# Patient Record
Sex: Female | Born: 1945
Health system: Southern US, Community
[De-identification: ages and names within clinical notes are randomized; demographics above are authoritative.]

## PROBLEM LIST (undated history)

## (undated) DIAGNOSIS — F419 Anxiety disorder, unspecified: Secondary | ICD-10-CM

## (undated) DIAGNOSIS — E119 Type 2 diabetes mellitus without complications: Secondary | ICD-10-CM

## (undated) DIAGNOSIS — E785 Hyperlipidemia, unspecified: Secondary | ICD-10-CM

## (undated) DIAGNOSIS — E079 Disorder of thyroid, unspecified: Secondary | ICD-10-CM

## (undated) DIAGNOSIS — E039 Hypothyroidism, unspecified: Secondary | ICD-10-CM

## (undated) HISTORY — DX: Disorder of thyroid, unspecified: E07.9

## (undated) HISTORY — DX: Anxiety disorder, unspecified: F41.9

## (undated) HISTORY — DX: Hyperlipidemia, unspecified: E78.5

---

## 1971-11-17 HISTORY — PX: OTHER SURGICAL HISTORY: SHX169

## 1990-11-16 HISTORY — PX: ABDOMINAL HYSTERECTOMY: SHX81

## 1994-11-16 HISTORY — PX: CHOLECYSTECTOMY: SHX55

## 2004-11-12 ENCOUNTER — Ambulatory Visit: Payer: Self-pay | Admitting: Unknown Physician Specialty

## 2006-05-13 ENCOUNTER — Ambulatory Visit: Payer: Self-pay | Admitting: Otolaryngology

## 2007-07-04 DIAGNOSIS — N3941 Urge incontinence: Secondary | ICD-10-CM | POA: Insufficient documentation

## 2007-07-04 DIAGNOSIS — E049 Nontoxic goiter, unspecified: Secondary | ICD-10-CM | POA: Insufficient documentation

## 2007-07-04 DIAGNOSIS — E010 Iodine-deficiency related diffuse (endemic) goiter: Secondary | ICD-10-CM | POA: Insufficient documentation

## 2007-07-04 HISTORY — DX: Nontoxic goiter, unspecified: E04.9

## 2007-09-20 ENCOUNTER — Ambulatory Visit: Payer: Self-pay | Admitting: Unknown Physician Specialty

## 2009-01-24 ENCOUNTER — Ambulatory Visit: Payer: Self-pay | Admitting: Otolaryngology

## 2009-07-29 ENCOUNTER — Ambulatory Visit: Payer: Self-pay | Admitting: Otolaryngology

## 2010-02-24 ENCOUNTER — Ambulatory Visit: Payer: Self-pay | Admitting: Otolaryngology

## 2010-03-17 ENCOUNTER — Ambulatory Visit: Payer: Self-pay | Admitting: Otolaryngology

## 2010-12-30 IMAGING — US US THYROID
1 series · 17 of 25 positions shown · non-contrast
Comparison: none

REASON FOR EXAM: thyroid nodule  req [facility]
COMMENTS:

[Series 1: us thyroid · 17 of 40 slices shown]
[im 1/40]
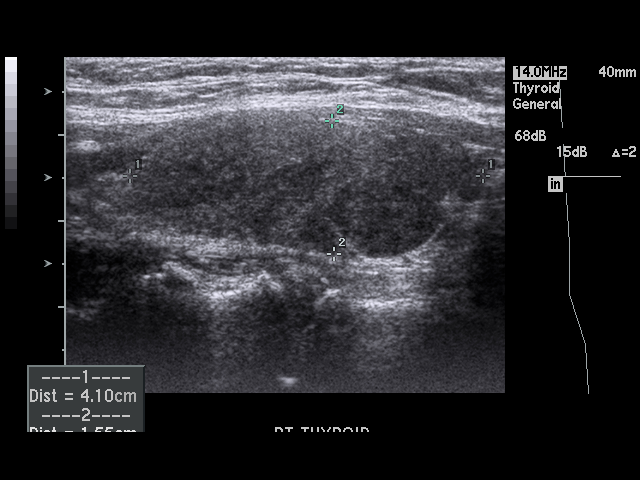
[im 4/40]
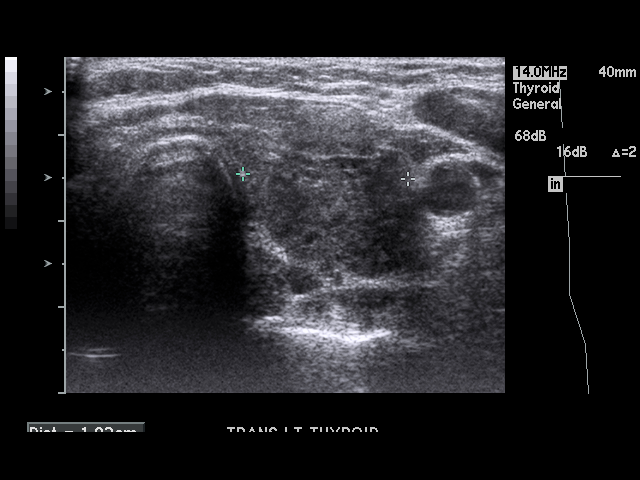
[im 5/40]
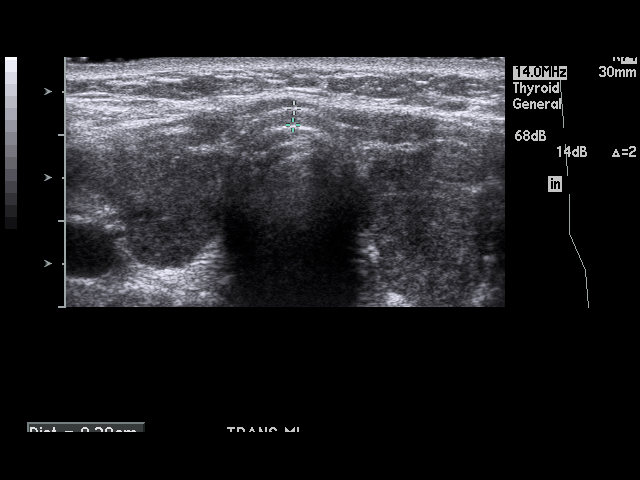
[im 9/40]
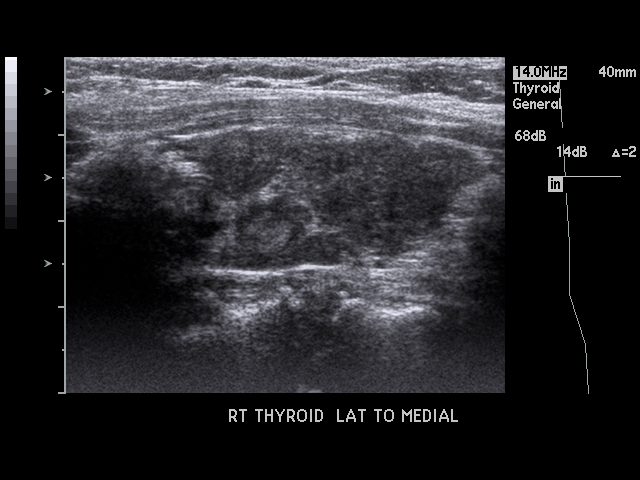
[im 10/40]
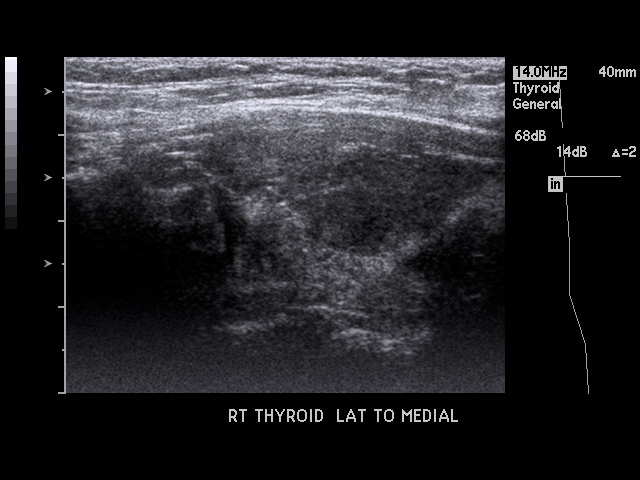
[im 14/40]
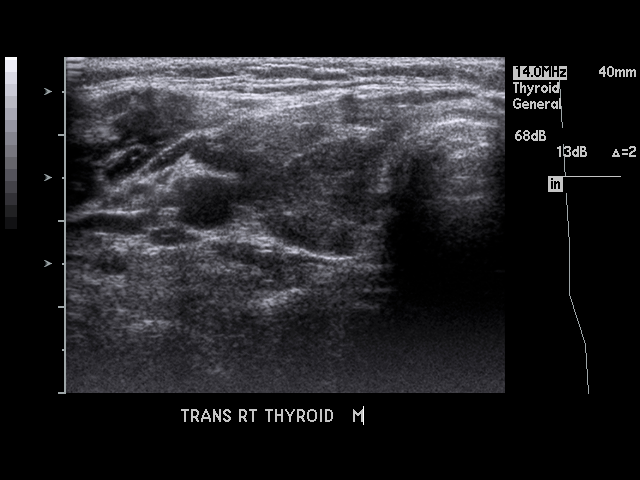
[im 15/40]
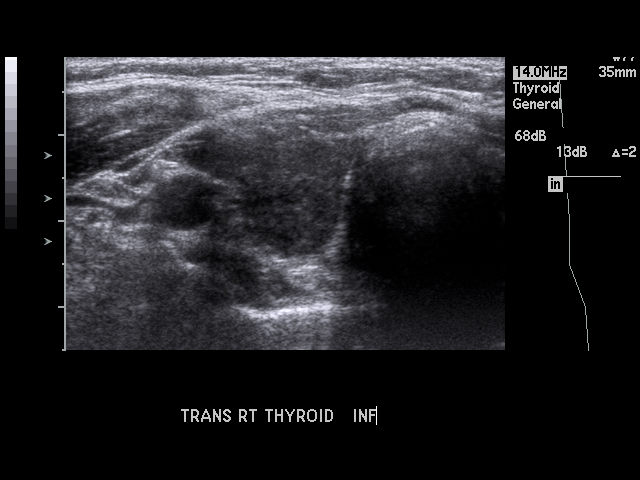
[im 18/40]
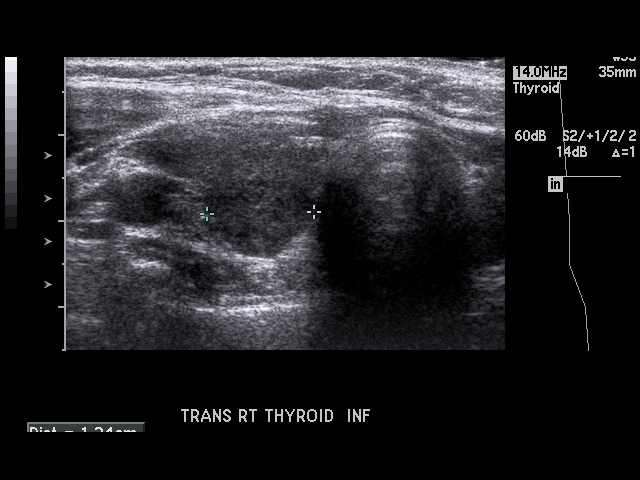
[im 20/40]
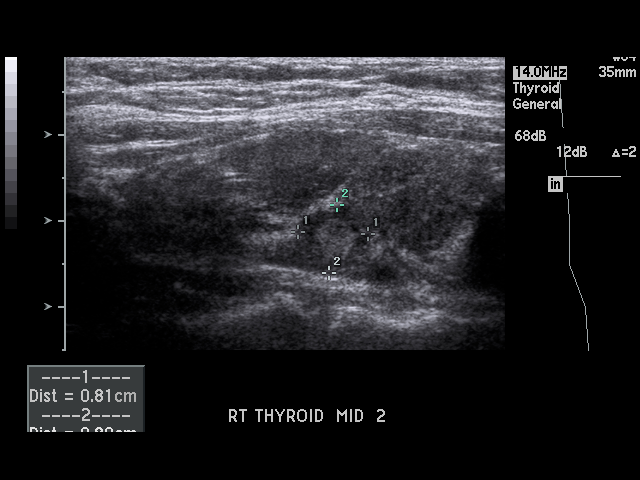
[im 22/40]
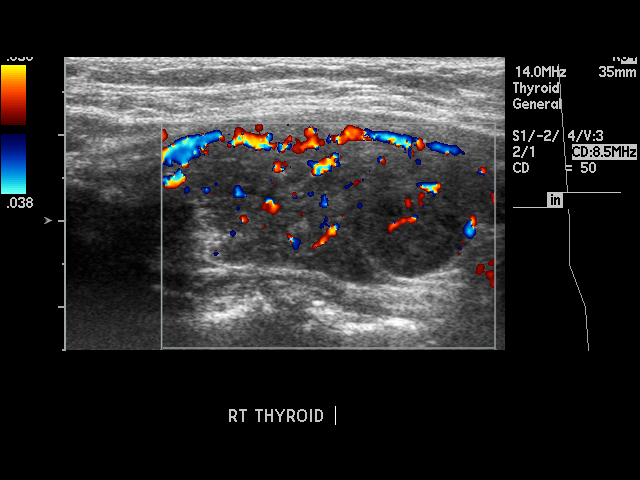
[im 25/40]
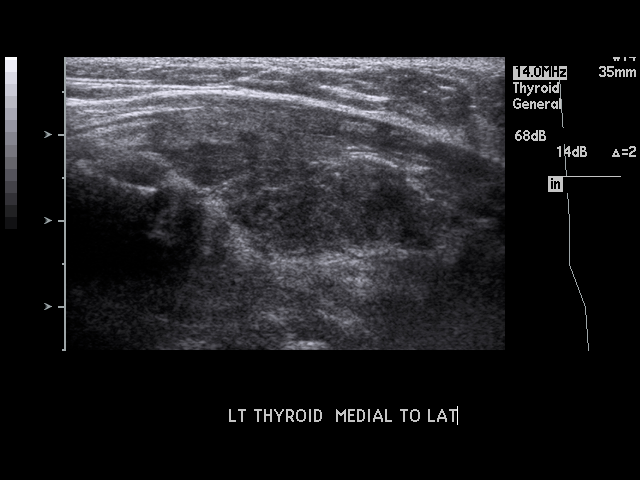
[im 27/40]
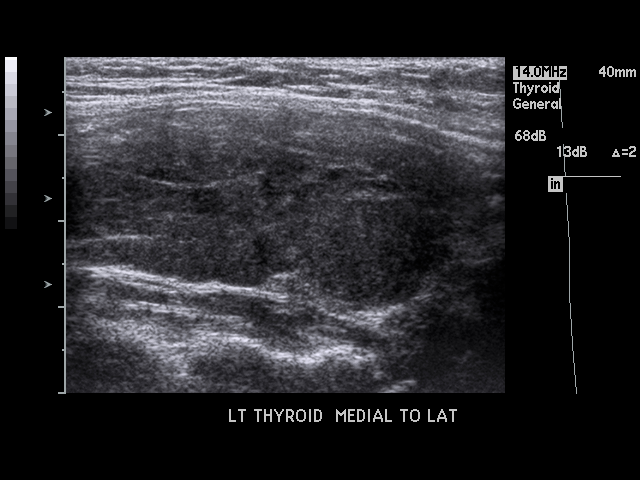
[im 30/40]
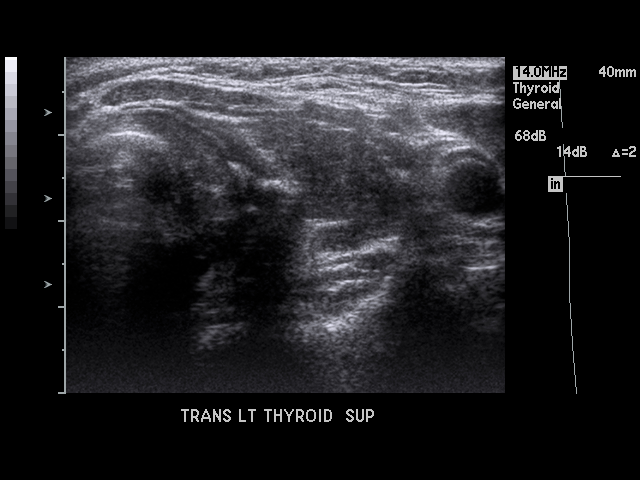
[im 31/40]
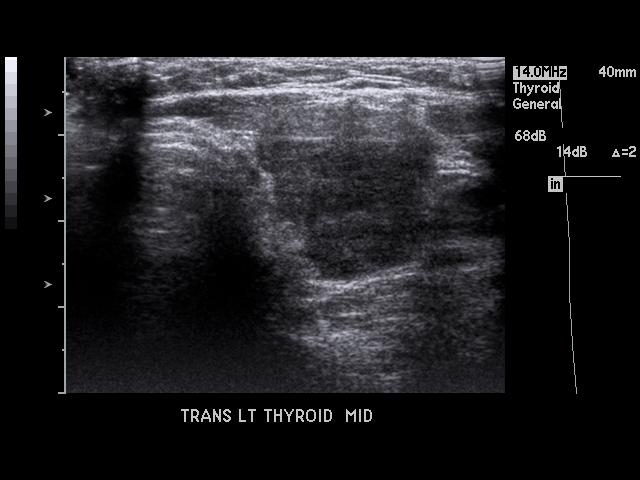
[im 35/40]
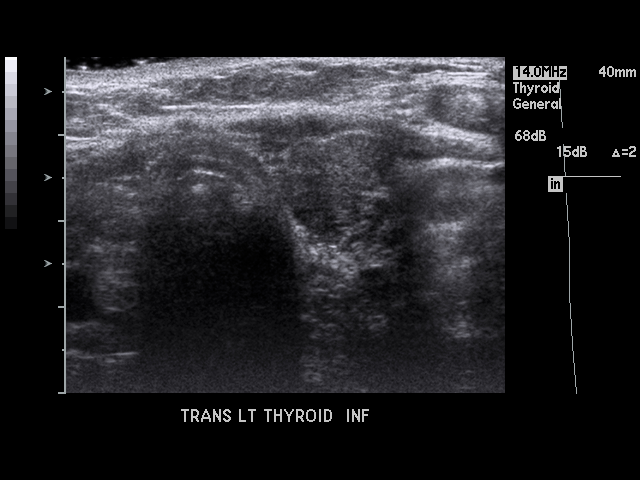
[im 36/40]
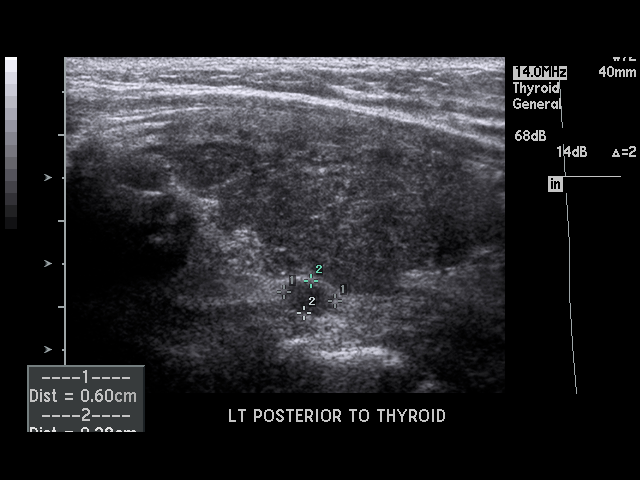
[im 40/40]
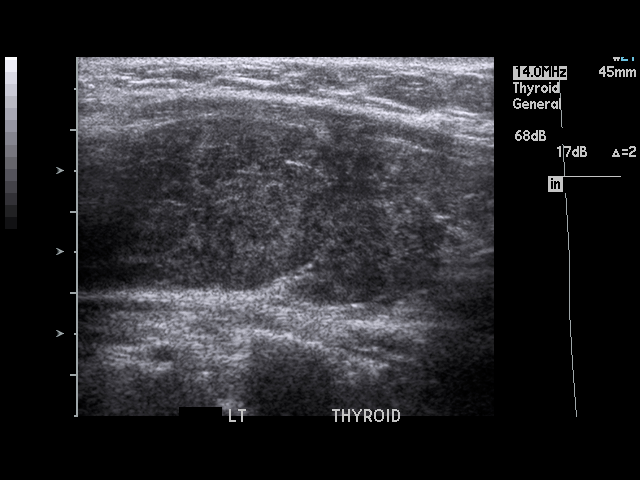

[17 of 25 positions shown; findings below may reference images not displayed]

PROCEDURE:     US  - US THYROID  - January 24, 2009 [DATE]

RESULT:     The right thyroid lobe measures 4.1 x 2.2 x 1.6 cm. The left
thyroid lobe measures 4.7 x 1.9 x 2.1 cm. The echotexture of the thyroid
tissue is heterogeneous. On the right in the midpole there is a
centimeter diameter hypoechoic nodule. In the lower pole there is a 1.3 by
1.2 x 1 cm diameter hypoechoic nodule. On the left no definite nodule is
demonstrated, but posterior to the thyroid there may be a nodule such as a
lymph node measuring approximately 0.6 centimeters in diameter. This is a
nonspecific finding however.
IMPRESSION: 1. The right thyroid lobe contains 2 hypoechoic nodules. I see no abnormal
nodules on the left.
2. Posterior to the left lobe there is an approximately 0.6 cm diameter
nodule. Further evaluation with CT scanning of the neck could be considered
if felt clinically indicated. Alternatively continued followup thyroid
ultrasound could be performed.

## 2012-01-30 IMAGING — US US THYROID
1 series · 17 of 25 positions shown · non-contrast
Comparison: none

REASON FOR EXAM: thyroid nodule hypothryoidism
COMMENTS:

[Series 1: us thyroid · 17 of 56 slices shown]
[im 1/56]
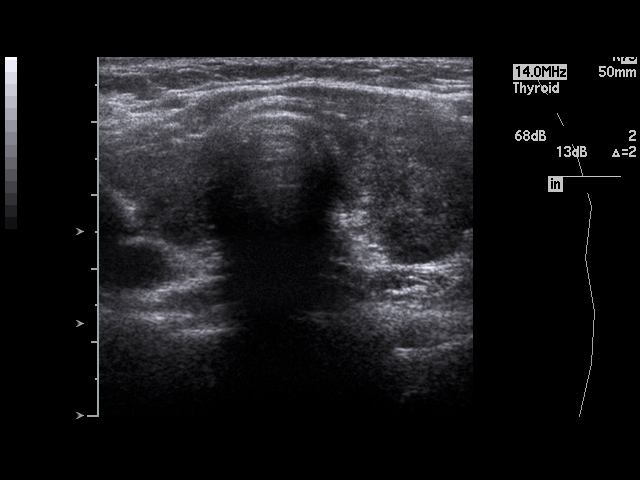
[im 5/56]
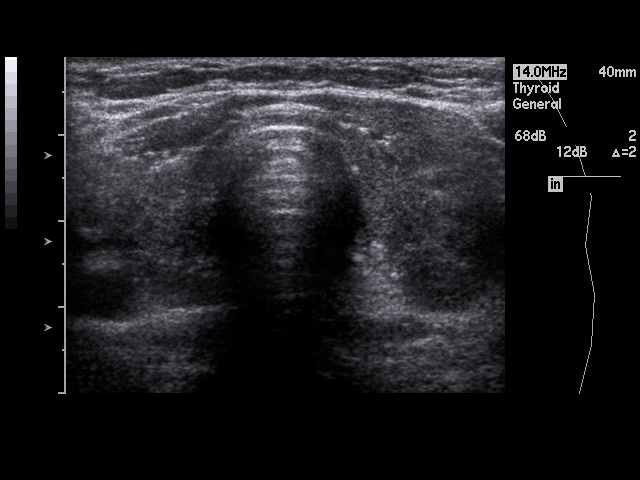
[im 7/56]
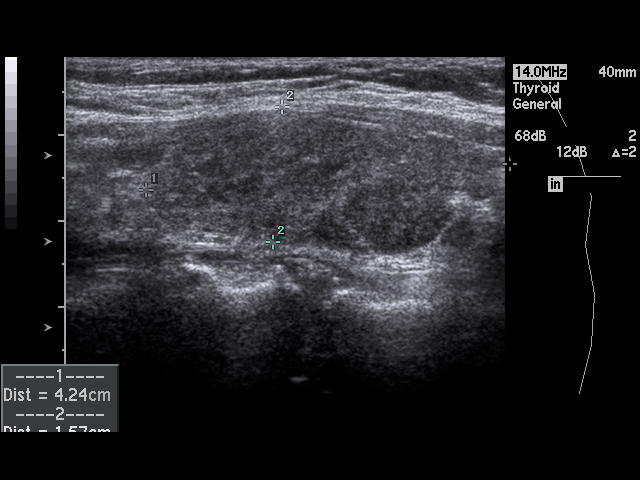
[im 12/56]
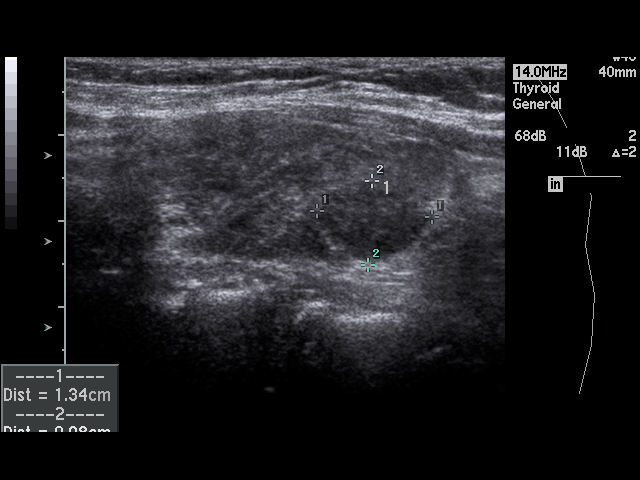
[im 14/56]
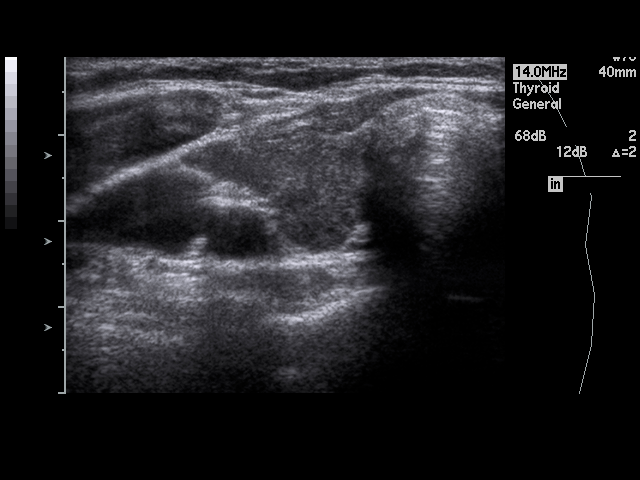
[im 19/56]
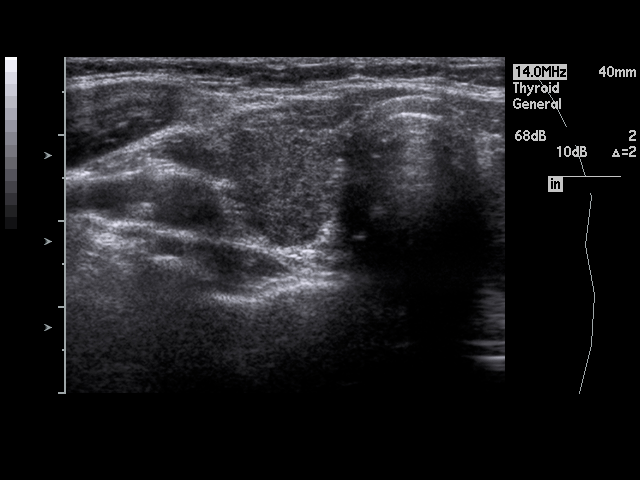
[im 21/56]
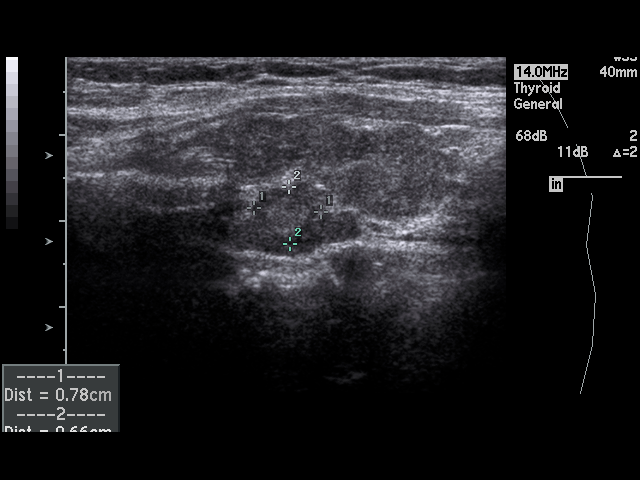
[im 26/56]
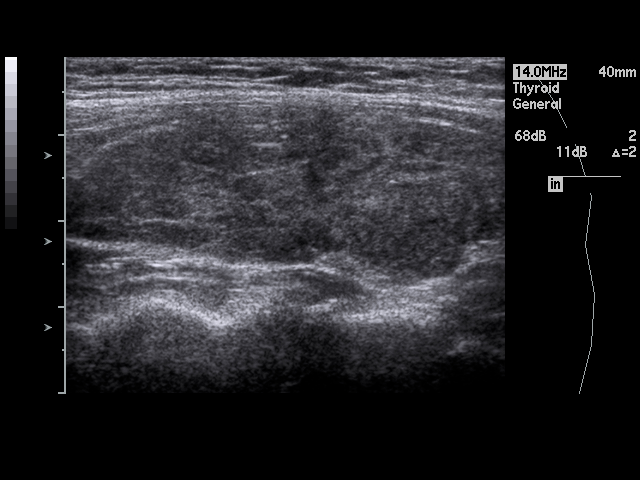
[im 28/56]
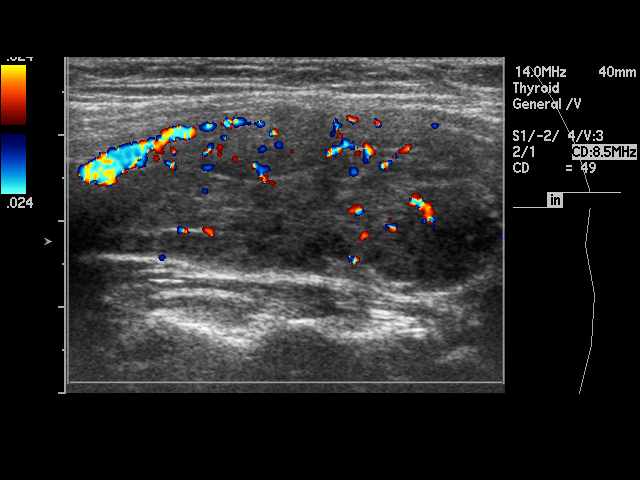
[im 30/56]
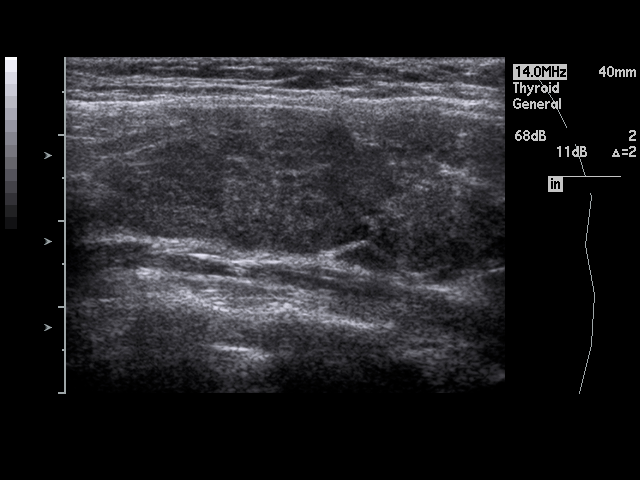
[im 35/56]
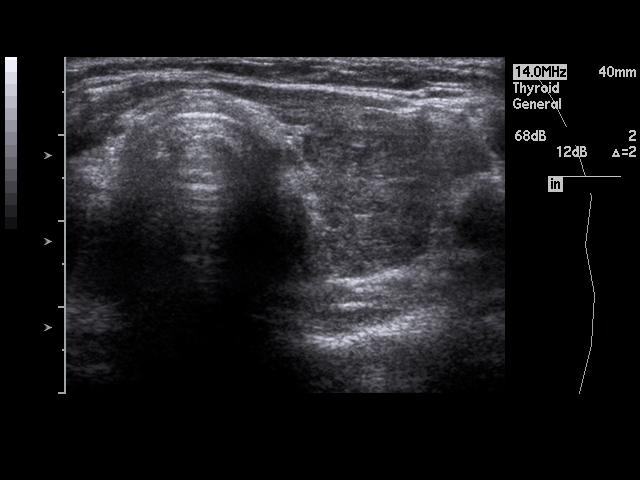
[im 37/56]
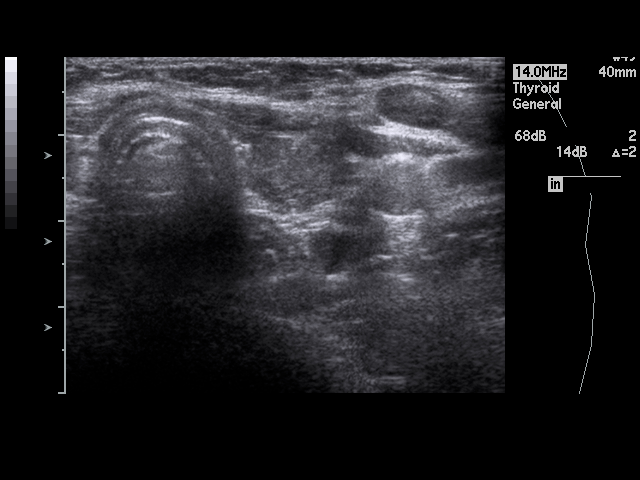
[im 42/56]
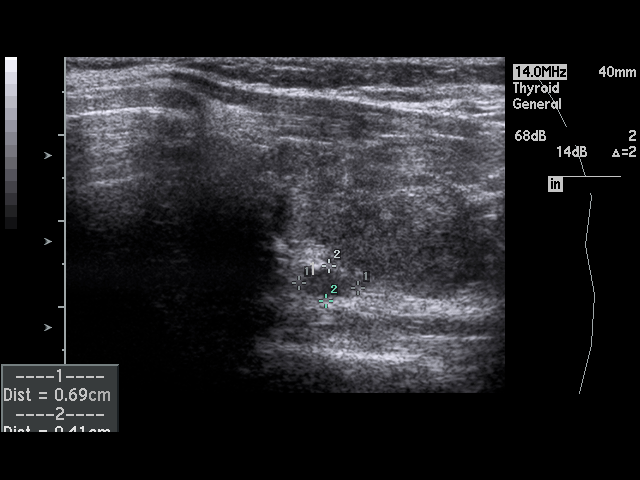
[im 44/56]
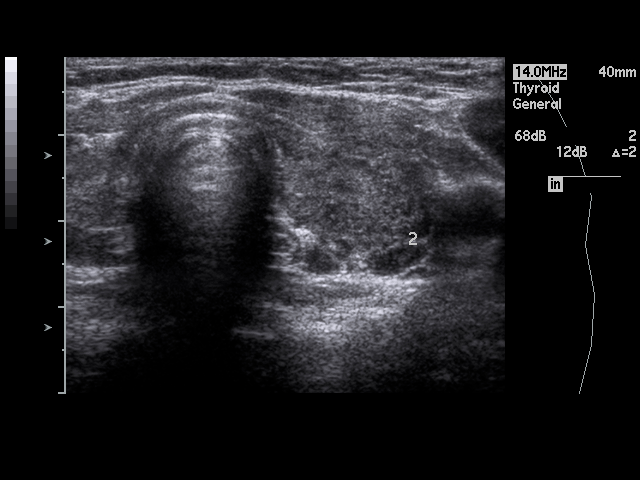
[im 49/56]
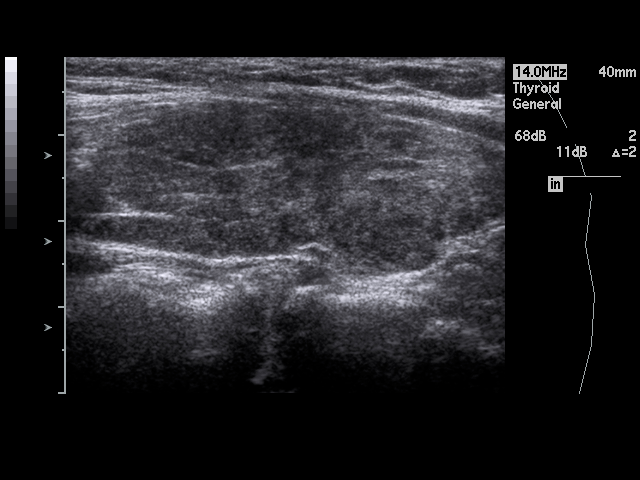
[im 51/56]
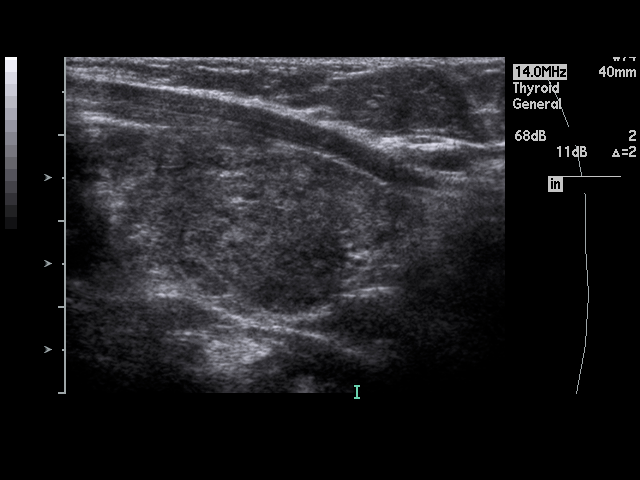
[im 56/56]
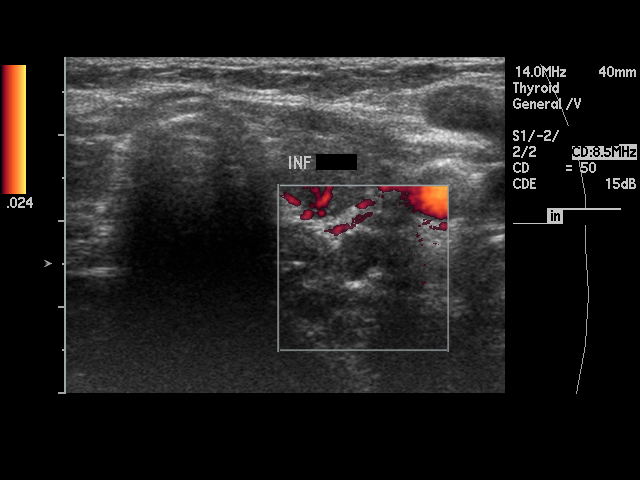

[17 of 25 positions shown; findings below may reference images not displayed]

PROCEDURE:     US  - US THYROID  - February 24, 2010  [DATE]

RESULT:

The right lobe of the thyroid measures 4.24 x 1.49 x 1.57 cm. The isthmus is
2.2 mm in thickness. The left lobe measures 5.4 x 1.7 x 1.93 cm. Evaluation
of the right lobe of the thyroid demonstrates a solid appearing nodule in
the inferior portion of the thyroid measuring 1.34 x 0.92 x 0.98 cm. A
second smaller nodule is identified within the midportion measuring 0.78 x
0.84 centimeters. The right lobe of the thyroid otherwise demonstrates a
heterogeneous echotexture. Evaluation of the left lobe of the thyroid
demonstrates a heterogeneous echotexture without evidence of focal solid or
cystic nodules. Small, solid-appearing nodules project posterior to the
thyroid. These areas measure 0.69 x 0.43 x 0.41 cm, 0.72 x 0.75 x 0.32 cm,
and 0.10 x 0.62 x 0.53.

When correlated with a previous ultrasound of the thyroid dated 07/29/2009
the nodules within the right lobe of the thyroid are stable. The small
nodules posterior to the left lobe of the thyroid have increased in numbers.
Previously, a single nodule was described.
IMPRESSION: 1. Stable nodules within the right lobe of the thyroid.
2. Increased numbers of the small nodules posterior to the left lobe. These
may represent lymph nodes though if there is concern of parathyroid
abnormality, parathyroid nodules cannot be excluded and if clinically
warranted can be further evaluated with parathyroid nuclear medicine
evaluation. Correlation with parathyroid function test is recommended.

## 2012-02-03 LAB — HM COLONOSCOPY

## 2012-10-24 DIAGNOSIS — L989 Disorder of the skin and subcutaneous tissue, unspecified: Secondary | ICD-10-CM | POA: Insufficient documentation

## 2012-11-16 HISTORY — PX: EYE SURGERY: SHX253

## 2013-09-20 LAB — HM PAP SMEAR: HM PAP: NEGATIVE

## 2013-09-26 LAB — HM DEXA SCAN: HM Dexa Scan: NORMAL

## 2014-01-02 LAB — HM MAMMOGRAPHY

## 2014-04-17 ENCOUNTER — Ambulatory Visit: Payer: Self-pay | Admitting: Internal Medicine

## 2015-01-10 LAB — BASIC METABOLIC PANEL
BUN: 14 mg/dL (ref 4–21)
CREATININE: 0.6 mg/dL (ref 0.5–1.1)
Glucose: 106 mg/dL
Potassium: 4.6 mmol/L (ref 3.4–5.3)
SODIUM: 142 mmol/L (ref 137–147)

## 2015-01-10 LAB — LIPID PANEL
CHOLESTEROL: 179 mg/dL (ref 0–200)
HDL: 56 mg/dL (ref 35–70)
LDL Cholesterol: 91 mg/dL
TRIGLYCERIDES: 158 mg/dL (ref 40–160)

## 2015-01-10 LAB — HEPATIC FUNCTION PANEL
ALT: 25 U/L (ref 7–35)
AST: 20 U/L (ref 13–35)

## 2015-01-10 LAB — HEMOGLOBIN A1C: Hgb A1c MFr Bld: 5.3 % (ref 4.0–6.0)

## 2015-07-15 ENCOUNTER — Encounter: Payer: Self-pay | Admitting: Family Medicine

## 2015-07-15 ENCOUNTER — Ambulatory Visit (INDEPENDENT_AMBULATORY_CARE_PROVIDER_SITE_OTHER): Payer: Medicare Other | Admitting: Family Medicine

## 2015-07-15 VITALS — BP 122/74 | HR 68 | Temp 97.8°F | Resp 16 | Wt 183.0 lb

## 2015-07-15 DIAGNOSIS — F419 Anxiety disorder, unspecified: Secondary | ICD-10-CM | POA: Insufficient documentation

## 2015-07-15 DIAGNOSIS — N951 Menopausal and female climacteric states: Secondary | ICD-10-CM | POA: Insufficient documentation

## 2015-07-15 DIAGNOSIS — S161XXA Strain of muscle, fascia and tendon at neck level, initial encounter: Secondary | ICD-10-CM | POA: Insufficient documentation

## 2015-07-15 DIAGNOSIS — E781 Pure hyperglyceridemia: Secondary | ICD-10-CM | POA: Insufficient documentation

## 2015-07-15 DIAGNOSIS — K648 Other hemorrhoids: Secondary | ICD-10-CM | POA: Insufficient documentation

## 2015-07-15 DIAGNOSIS — L409 Psoriasis, unspecified: Secondary | ICD-10-CM | POA: Insufficient documentation

## 2015-07-15 DIAGNOSIS — K649 Unspecified hemorrhoids: Secondary | ICD-10-CM

## 2015-07-15 DIAGNOSIS — L290 Pruritus ani: Secondary | ICD-10-CM | POA: Diagnosis not present

## 2015-07-15 DIAGNOSIS — R234 Changes in skin texture: Secondary | ICD-10-CM

## 2015-07-15 DIAGNOSIS — E039 Hypothyroidism, unspecified: Secondary | ICD-10-CM | POA: Insufficient documentation

## 2015-07-15 DIAGNOSIS — R239 Unspecified skin changes: Secondary | ICD-10-CM

## 2015-07-15 DIAGNOSIS — Z1211 Encounter for screening for malignant neoplasm of colon: Secondary | ICD-10-CM | POA: Diagnosis not present

## 2015-07-15 DIAGNOSIS — E559 Vitamin D deficiency, unspecified: Secondary | ICD-10-CM | POA: Insufficient documentation

## 2015-07-15 DIAGNOSIS — R739 Hyperglycemia, unspecified: Secondary | ICD-10-CM | POA: Insufficient documentation

## 2015-07-15 DIAGNOSIS — S8000XA Contusion of unspecified knee, initial encounter: Secondary | ICD-10-CM | POA: Insufficient documentation

## 2015-07-15 MED ORDER — NYSTATIN 100000 UNIT/GM EX OINT: 1.0000 "application " | TOPICAL_OINTMENT | Freq: Two times a day (BID) | CUTANEOUS | Status: DC

## 2015-07-15 NOTE — Progress Notes (Signed)
Subjective:    Patient ID: Jodi Day, female    DOB: January 22, 1946, 69 y.o.   MRN: [id number]  Urinary Tract Infection  The current episode started in the past 7 days. The problem has been gradually improving. The quality of the pain is described as burning. There has been no fever. There is a history of pyelonephritis (Had that about 30 years ago.). Associated symptoms include urgency. Pertinent negatives include no chills, flank pain, frequency, hematuria, nausea or vomiting. Her past medical history is significant for recurrent UTIs.    Hemorrhoids: Patient complains of evaluation of possible hemorrhoids. Onset of symptoms was gradual starting 2 weeks ago ago with gradually improving course since that time.  She describes symptoms as anorectal itching, bleeding which only occurs with bowel movements and rectal blood .Marland Kitchen Treatment to date has been Anusol. Patient denies family hx of colorectal CA.   Patient Active Problem List   Diagnosis Date Noted  . Anxiety 07/15/2015  . Cervical muscle strain 07/15/2015  . Dermatitis, eczematoid 07/15/2015  . Blood glucose elevated 07/15/2015  . Hypertriglyceridemia 07/15/2015  . Adult hypothyroidism 07/15/2015  . Hemorrhoids, internal 07/15/2015  . Contusion of knee 07/15/2015  . Symptomatic menopausal or female climacteric states 07/15/2015  . Avitaminosis D 07/15/2015  . Disease of skin and subcutaneous tissue 10/24/2012  . Big thyroid 07/04/2007  . Urge incontinence 07/04/2007   Family History  Problem Relation Age of Onset  . Diabetes Mother   . Hypothyroidism Mother   . Stroke Mother   . COPD Father   . Prostate cancer Brother   . Colon polyps Brother   . Prostate cancer Brother    Social History   Social History  . Marital Status: Married    Spouse Name: N/A  . Number of Children: 2  . Years of Education: N/A   Occupational History  . 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 Smoking  status: Former Games developer  . Smokeless tobacco: Never Used  . Alcohol Use: Yes     Comment: Wine Rarely  . Drug Use: No  . Sexual Activity: Not on file   Other Topics Concern  . Not on file   Social History Narrative   Past Surgical History  Procedure Laterality Date  . Cholecystectomy  1996  . Abdominal hysterectomy  1992    fibroids. ovaries intact  . Tonsillectomy and adenoidectomy  1973  . Thyroidectomy  1973   Allergies  Allergen Reactions  . Penicillins     per pt's mother.   Previous Medications   ASCORBIC ACID (VITAMIN C) 1000 MG TABLET    Take 1 tablet by mouth daily.   CALCIUM CARBONATE-VIT D-MIN (CALCIUM 1200) 1200-1000 MG-UNIT CHEW    Chew 1 tablet by mouth daily.   COENZYME Q10 (COQ10) 400 MG CAPS    Take 1 capsule by mouth daily.   ESTRADIOL-ESTRIOL-PROGESTERONE (BIEST/PROGESTERONE) CREA    Place 1 Squirt onto the skin daily.   LEVOTHYROXINE (SYNTHROID, LEVOTHROID) 150 MCG TABLET    Take 150 mcg by mouth daily before breakfast.   LIOTHYRONINE (CYTOMEL) 5 MCG TABLET    Take 1 tablet by mouth daily.   METFORMIN (GLUCOPHAGE) 500 MG TABLET    Take 1 tablet by mouth 2 (two) times daily.   OMEGA-3 FATTY ACIDS PO    Take 1 capsule by mouth daily.   PROGESTERONE (PROMETRIUM) 200 MG CAPSULE    Take 1 capsule by mouth daily.  VITAMIN D, CHOLECALCIFEROL, 1000 UNITS CAPS    Take 1 capsule by mouth daily.   VITAMIN E 400 UNIT CAPSULE    Take 1 capsule by mouth daily.       Review of Systems  Constitutional: Positive for fatigue. Negative for fever, chills, diaphoresis, activity change, appetite change and unexpected weight change.  Respiratory: Negative for apnea, cough, choking, chest tightness, shortness of breath, wheezing and stridor.   Cardiovascular: Negative for chest pain, palpitations and leg swelling.  Gastrointestinal: Positive for anal bleeding and rectal pain. Negative for nausea, vomiting, abdominal pain, diarrhea, constipation, blood in stool and abdominal  distention.  Genitourinary: Positive for dysuria, urgency, decreased urine volume and difficulty urinating. Negative for frequency, hematuria, flank pain, vaginal bleeding, vaginal discharge, enuresis, genital sores, vaginal pain, menstrual problem, pelvic pain and dyspareunia.       Objective:   Physical Exam  Constitutional: She is oriented to person, place, and time. She appears well-developed and well-nourished.  Genitourinary: Rectal exam shows external hemorrhoid (Some erythema. Some excoriation from scratching.  No thrombosis noted. ).  Neurological: She is alert and oriented to person, place, and time.   BP 122/74 mmHg  Pulse 68  Temp(Src) 97.8 F (36.6 C) (Oral)  Resp 16  Wt 183 lb (83.008 kg)      Assessment & Plan:  1. Hemorrhoids, unspecified hemorrhoid type Had clearly been very itchy, will treat for fungal, apply zinc oxide over it. No cortisone for now at skin very friable. Patient instructed to call back if condition worsens or does not improve.    - nystatin ointment (MYCOSTATIN); Apply 1 application topically 2 (two) times daily.  Dispense: 30 g; Refill: 0  2. Encounter for screening colonoscopy- Will refer as needed.   3. Recent skin changes Will refer for follow up.  - Ambulatory referral to Dermatology  4. Pruritus ani Treat as noted. Further plan pending response to this treatment.  - nystatin ointment (MYCOSTATIN); Apply 1 application topically 2 (two) times daily.  Dispense: 30 g; Refill: 0

## 2015-09-20 ENCOUNTER — Other Ambulatory Visit: Payer: Self-pay | Admitting: Family Medicine

## 2015-09-20 DIAGNOSIS — L309 Dermatitis, unspecified: Secondary | ICD-10-CM

## 2015-11-27 LAB — HM DEXA SCAN: HM Dexa Scan: NORMAL

## 2015-12-04 ENCOUNTER — Telehealth: Payer: Self-pay | Admitting: Family Medicine

## 2016-01-03 DIAGNOSIS — E041 Nontoxic single thyroid nodule: Secondary | ICD-10-CM | POA: Diagnosis not present

## 2016-01-08 DIAGNOSIS — E890 Postprocedural hypothyroidism: Secondary | ICD-10-CM | POA: Diagnosis not present

## 2016-01-15 ENCOUNTER — Encounter: Payer: Self-pay | Admitting: Family Medicine

## 2016-01-15 ENCOUNTER — Ambulatory Visit (INDEPENDENT_AMBULATORY_CARE_PROVIDER_SITE_OTHER): Payer: Medicare Other | Admitting: Family Medicine

## 2016-01-15 VITALS — BP 110/66 | HR 74 | Temp 98.0°F | Resp 16 | Wt 179.0 lb

## 2016-01-15 DIAGNOSIS — Z7189 Other specified counseling: Secondary | ICD-10-CM

## 2016-01-15 DIAGNOSIS — Z23 Encounter for immunization: Secondary | ICD-10-CM

## 2016-01-15 DIAGNOSIS — L290 Pruritus ani: Secondary | ICD-10-CM | POA: Diagnosis not present

## 2016-01-15 NOTE — Progress Notes (Signed)
Patient ID: Jodi Day, female   DOB: 07/15/1946, 70 y.o.   MRN: [id number]       Patient: Jodi Day Female    DOB: 23-Sep-1946   70 y.o.   MRN: [id number] Visit Date: 01/15/2016  Today's Provider: Lorie Phenix, MD   Chief Complaint  Patient presents with  . Rash   Subjective:    HPI Rash: Patient reports that she is having rectal itching. Patient was last seen on 07/15/2015 and was prescribed nystatin ointment. Patient reports mild relief with ointment. Does have eczema other places and sees dermatology for this.   Does have intermittent symptoms. Itching worse at night.  Keeps patient up.   Also has recently has tragic loss of her brother who she is very close to.  They worked together. He died suddenly from pneumonia.  Was healthy prior to this.      Allergies  Allergen Reactions  . Penicillins     per pt's mother.   Previous Medications   ASCORBIC ACID (VITAMIN C) 1000 MG TABLET    Take 1 tablet by mouth daily.   CALCIUM CARBONATE-VIT D-MIN (CALCIUM 1200) 1200-1000 MG-UNIT CHEW    Chew 1 tablet by mouth daily.   ESTRADIOL-ESTRIOL-PROGESTERONE (BIEST/PROGESTERONE) CREA    Place 1 Squirt onto the skin daily.   LEVOTHYROXINE (SYNTHROID, LEVOTHROID) 150 MCG TABLET    Take 150 mcg by mouth daily before breakfast.   LIOTHYRONINE (CYTOMEL) 5 MCG TABLET    Take 1 tablet by mouth daily.   METFORMIN (GLUCOPHAGE) 500 MG TABLET    Take 1 tablet by mouth 2 (two) times daily.   NYSTATIN OINTMENT (MYCOSTATIN)    APPLY TOPICALLY TWICE A DAY   OMEGA-3 FATTY ACIDS PO    Take 1 capsule by mouth daily.   PROGESTERONE (PROMETRIUM) 200 MG CAPSULE    Take 1 capsule by mouth daily.   VITAMIN D, CHOLECALCIFEROL, 1000 UNITS CAPS    Take 1 capsule by mouth daily.   VITAMIN E 400 UNIT CAPSULE    Take 1 capsule by mouth daily.    Review of Systems  Constitutional: Negative.   Skin: Positive for rash.    Social History  Substance Use Topics  . Smoking status: Former Games developer  . Smokeless  tobacco: Never Used  . Alcohol Use: Yes     Comment: Wine Rarely   Objective:   BP 110/66 mmHg  Pulse 74  Temp(Src) 98 F (36.7 C) (Oral)  Resp 16  Wt 179 lb (81.194 kg)  SpO2 98%  Physical Exam  Constitutional: She is oriented to person, place, and time. She appears well-developed and well-nourished.  Neurological: She is alert and oriented to person, place, and time.  Skin: Rash noted.  Erythematous rash around rectum. Excoriated from scratching.   Psychiatric: She has a normal mood and affect. Her behavior is normal. Judgment and thought content normal.  Tearful when discussing her brother.       Assessment & Plan:     1. Pruritus ani Refer to her dermatologist.    2. Need for pneumococcal vaccination Given today.  - Pneumococcal polysaccharide vaccine 23-valent greater than or equal to 2yo subcutaneous/IM   3. Grief counseling New problem. Discussed loss of her brother at length. Will treat if needed.    Patient was seen and examined by Leo Grosser, MD, and note scribed by Rondel Baton, CMA.   I have reviewed the document for accuracy and completeness and I agree with above. Harriett Sine  Adele Barthel, MD    Lorie Phenix, MD  [facility]

## 2016-01-17 ENCOUNTER — Encounter: Payer: Self-pay | Admitting: Family Medicine

## 2016-01-28 ENCOUNTER — Telehealth: Payer: Self-pay | Admitting: Family Medicine

## 2016-01-28 MED ORDER — MEBENDAZOLE 100 MG PO CHEW: 100.0000 mg | CHEWABLE_TABLET | Freq: Once | ORAL | Status: DC

## 2016-01-28 NOTE — Telephone Encounter (Signed)
Pt stated she was here for an OV on 01/15/16 and she does think she has pin worms and would like the RX sent to [facility] [address] Please advise. Pt would like a call back to advise her if and when the RX is sent to pharmacy. Thanks TNP

## 2016-02-04 DIAGNOSIS — L408 Other psoriasis: Secondary | ICD-10-CM | POA: Diagnosis not present

## 2016-02-25 ENCOUNTER — Encounter: Payer: Medicare Other | Admitting: Family Medicine

## 2016-02-26 DIAGNOSIS — B351 Tinea unguium: Secondary | ICD-10-CM | POA: Diagnosis not present

## 2016-02-26 DIAGNOSIS — L408 Other psoriasis: Secondary | ICD-10-CM | POA: Diagnosis not present

## 2016-03-09 DIAGNOSIS — E890 Postprocedural hypothyroidism: Secondary | ICD-10-CM | POA: Diagnosis not present

## 2016-03-18 DIAGNOSIS — E890 Postprocedural hypothyroidism: Secondary | ICD-10-CM | POA: Diagnosis not present

## 2016-03-22 IMAGING — US US PELV - US TRANSVAGINAL
1 series · 14 of 22 positions shown · non-contrast
Comparison: None

CLINICAL DATA: Pelvic pain.  Previous hysterectomy.

EXAM:
TRANSABDOMINAL AND TRANSVAGINAL ULTRASOUND OF PELVIS
TECHNIQUE: Both transabdominal and transvaginal ultrasound examinations of the
pelvis were performed. Transabdominal technique was performed for
global imaging of the pelvis including uterus, ovaries, adnexal
regions, and pelvic cul-de-sac. It was necessary to proceed with
endovaginal exam following the transabdominal exam to visualize the
pelvis and adnexal structures.

[Series 1: us pelv - us transvaginal · 0.29mm/px · 14 of 22 slices shown]
[im 1/22]
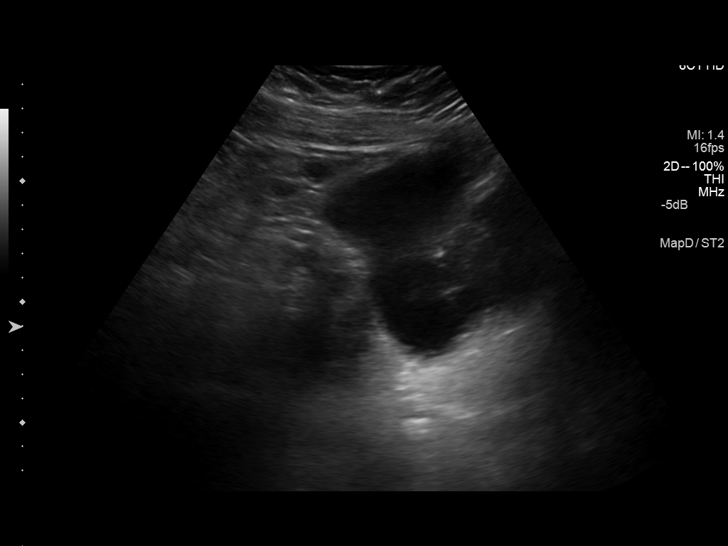
[im 3/22]
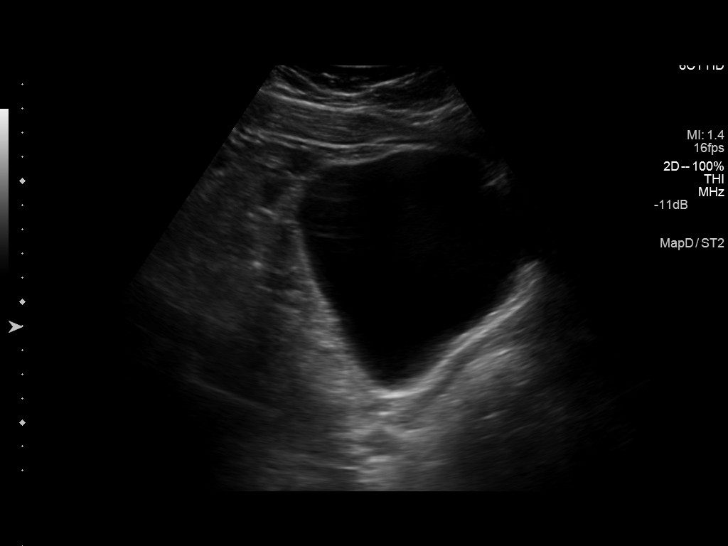
[im 4/22]
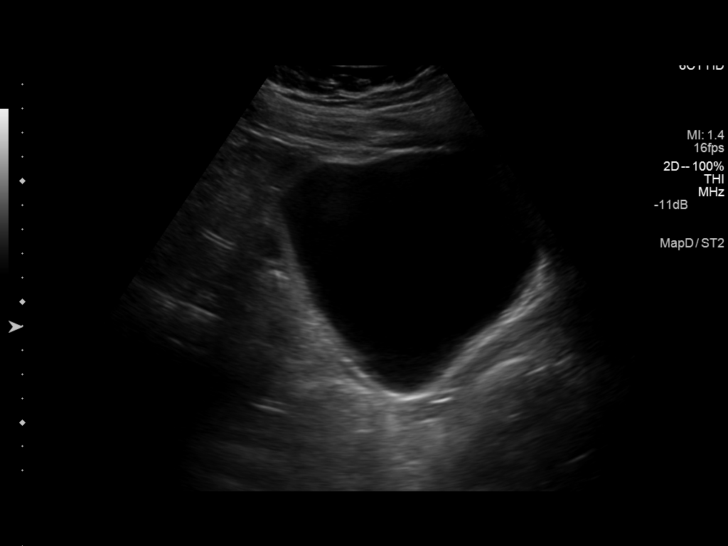
[im 6/22]
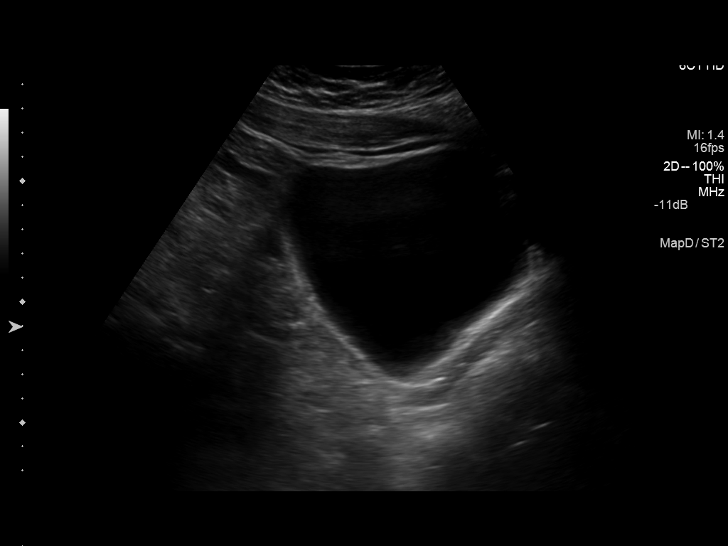
[im 8/22]
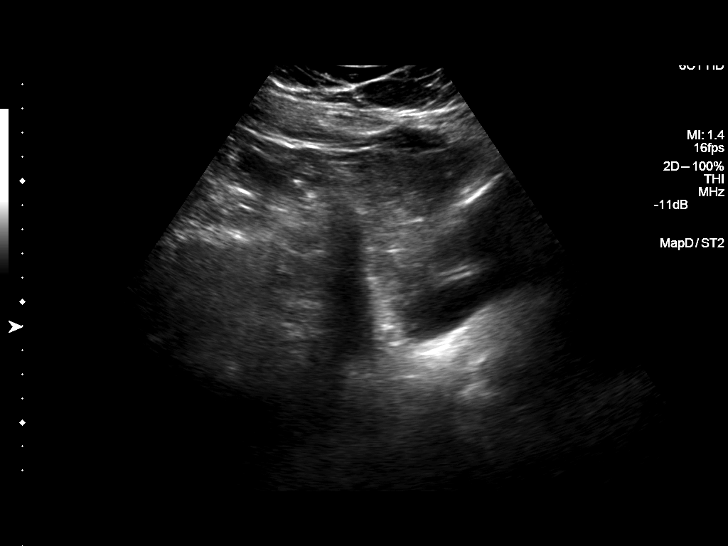
[im 9/22]
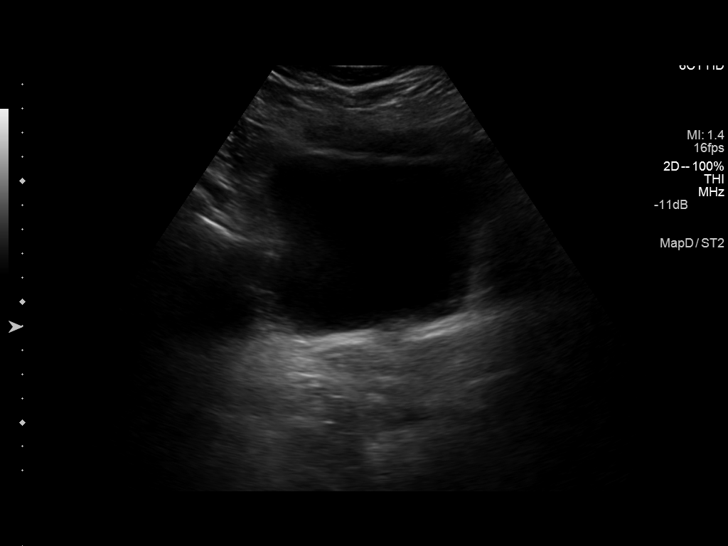
[im 11/22]
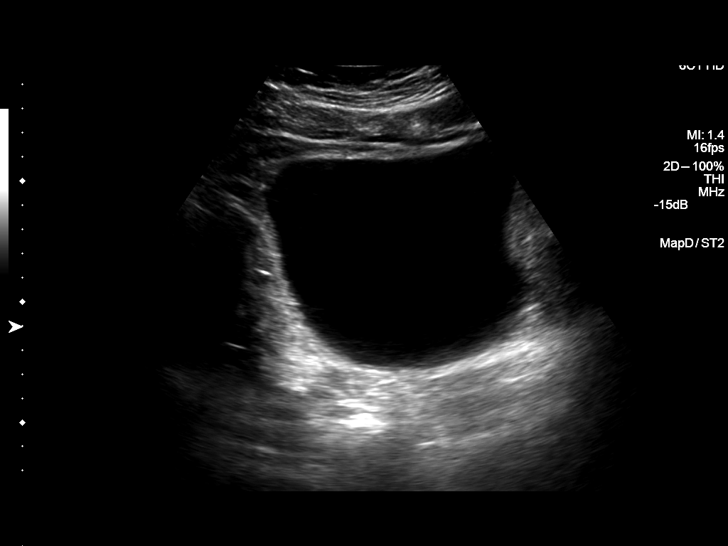
[im 12/22]
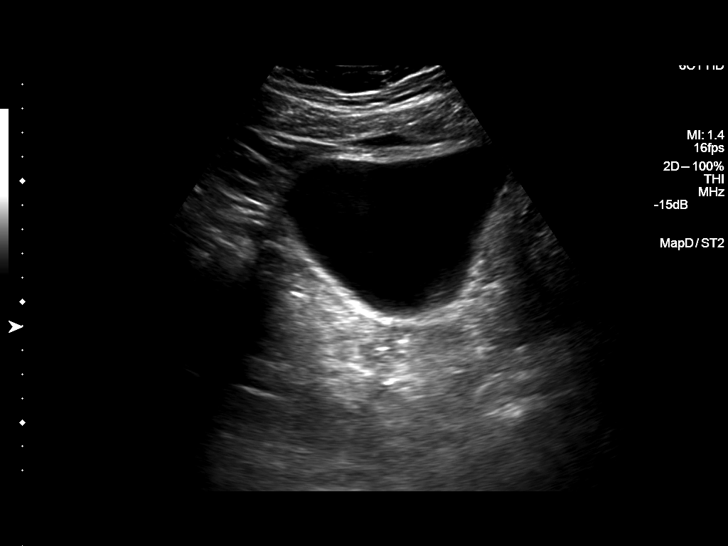
[im 14/22]
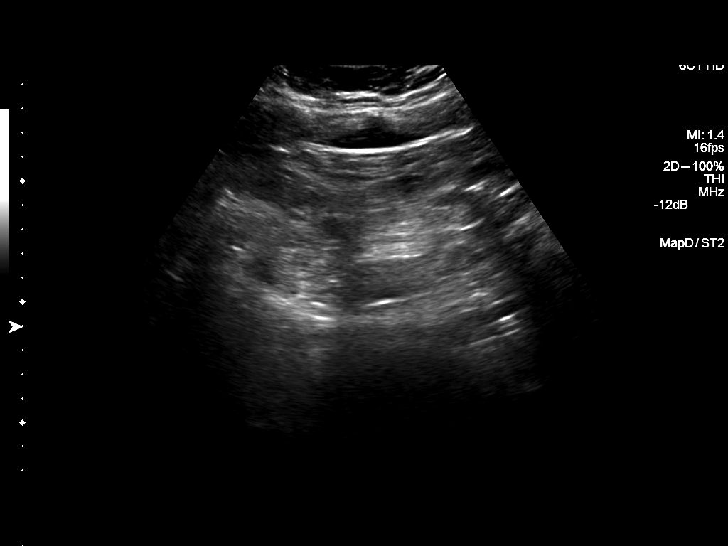
[im 15/22]
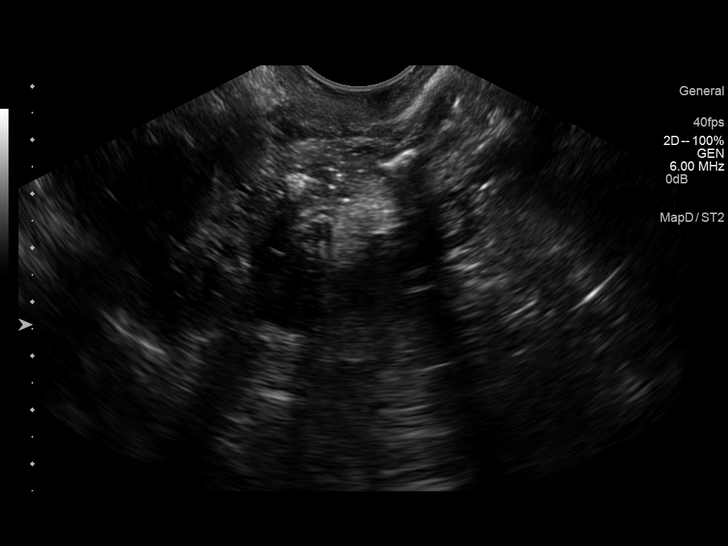
[im 17/22]
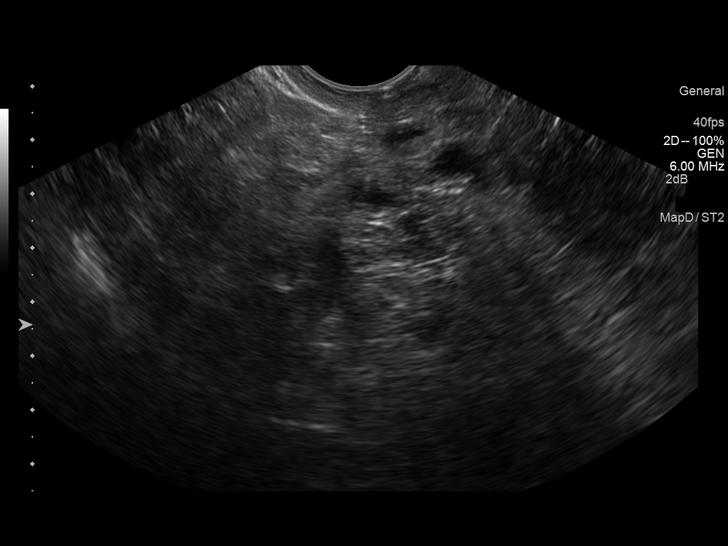
[im 19/22]
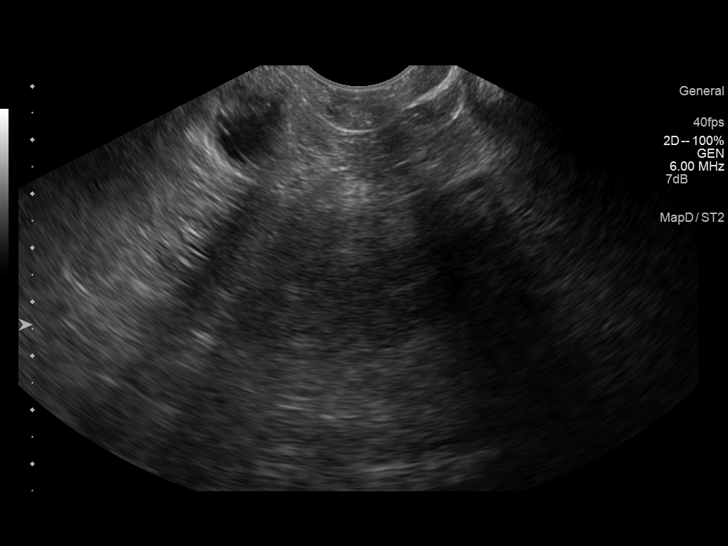
[im 20/22]
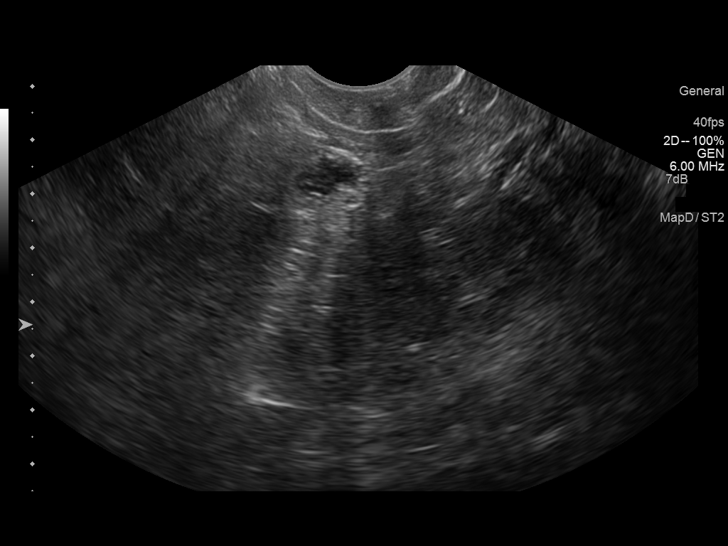
[im 22/22]
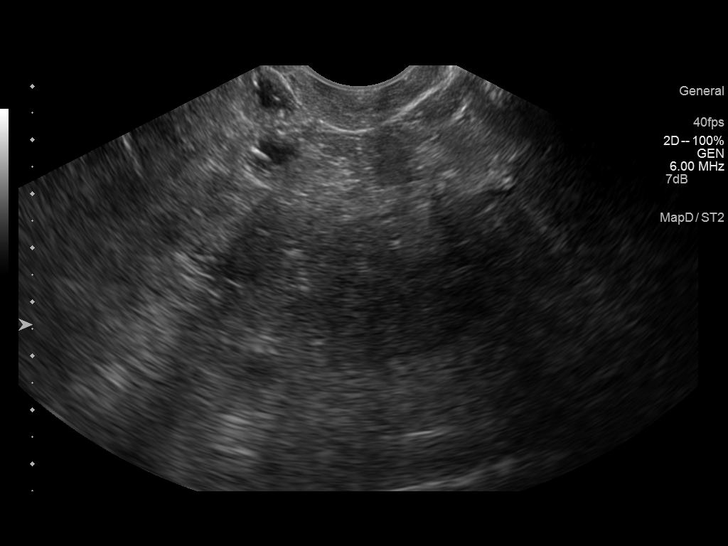

[14 of 22 positions shown; findings below may reference images not displayed]

FINDINGS: Uterus

Status post hysterectomy.

Endometrium

Not applicable.

Right ovary

Measurements: Not visualized.. No adnexal mass noted.

Left ovary

Measurements: Not visualized.. No adnexal mass noted.

Other findings

No free fluid.
IMPRESSION: 1. Findings compatible with previous hysterectomy.
2. No free fluid or adnexal mass identified.

## 2016-03-22 IMAGING — US ABDOMEN ULTRASOUND
1 series · 14 of 25 positions shown · non-contrast
Comparison: None.

CLINICAL DATA: Abdominal pain.  Previous cholecystectomy.

EXAM:
ULTRASOUND ABDOMEN COMPLETE

[Series 1: abdomen ultrasound · 0.25mm/px · 14 of 76 slices shown]
[im 1/76]
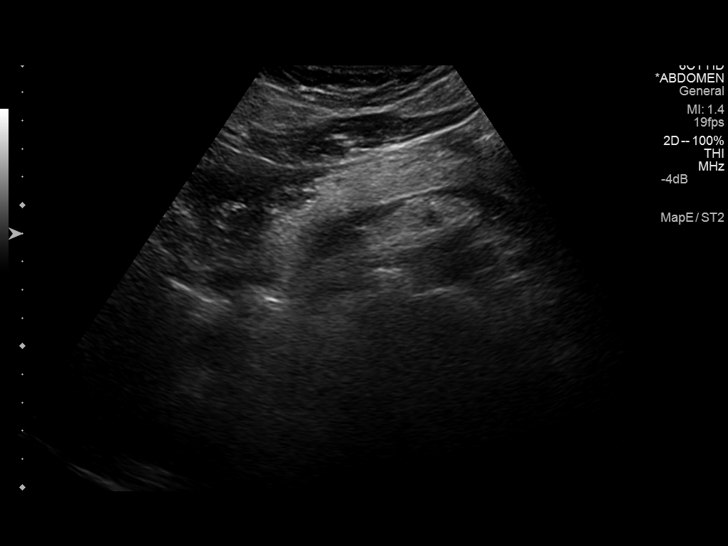
[im 7/76]
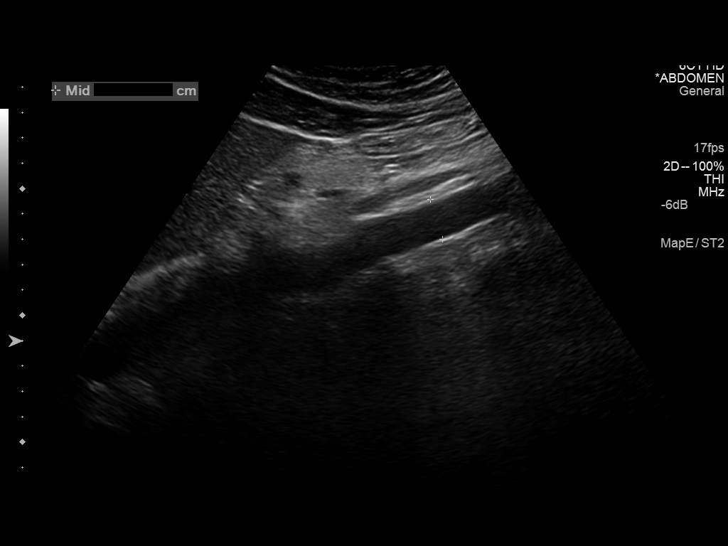
[im 13/76]
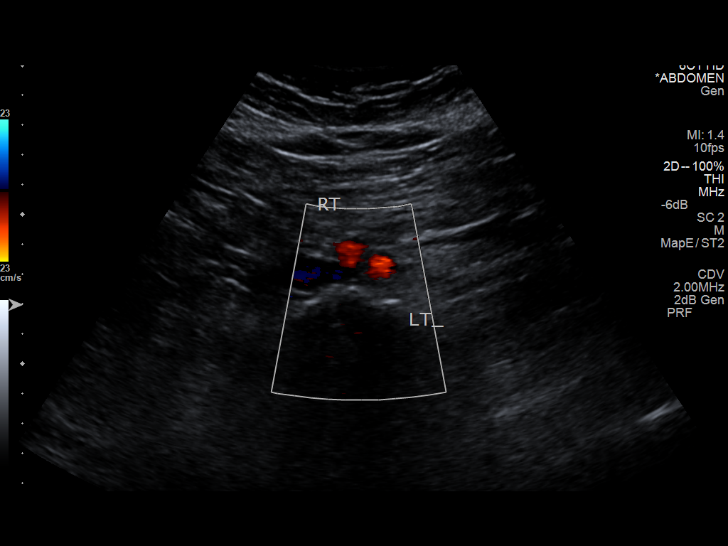
[im 19/76]
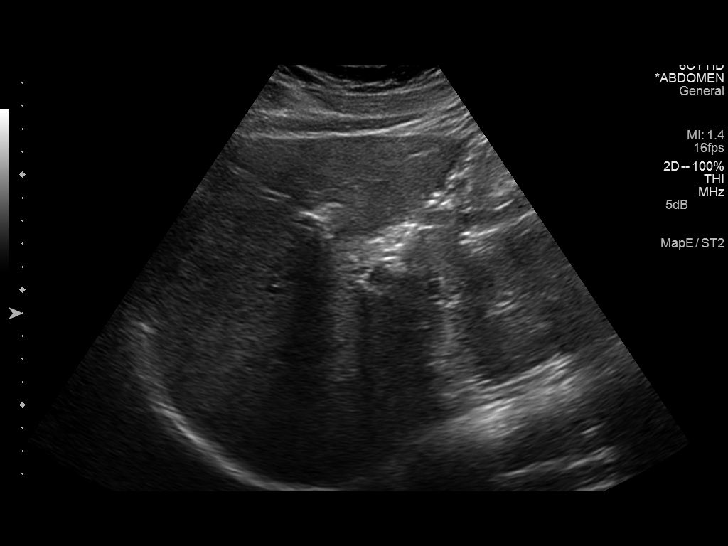
[im 26/76]
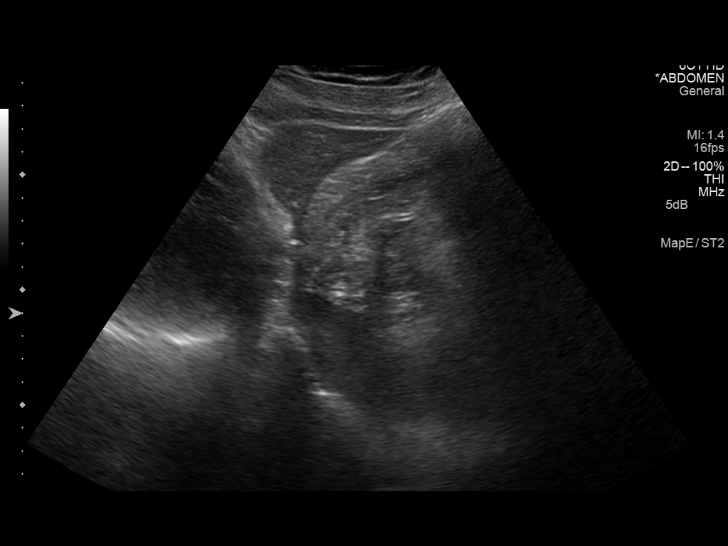
[im 29/76]
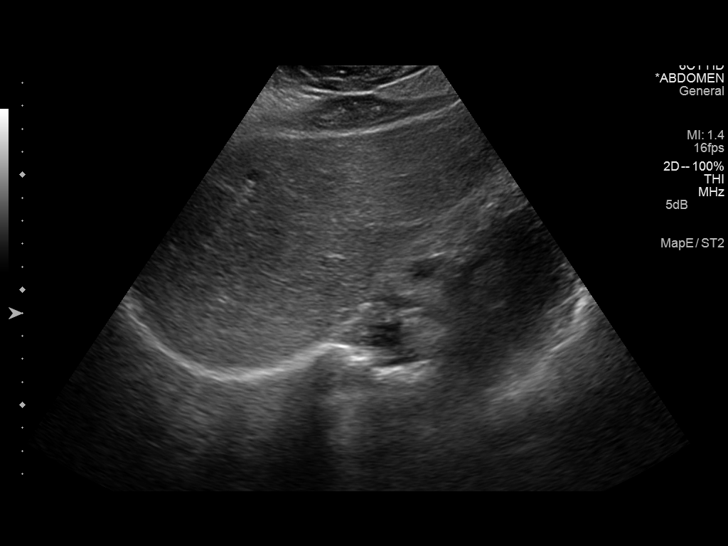
[im 35/76]
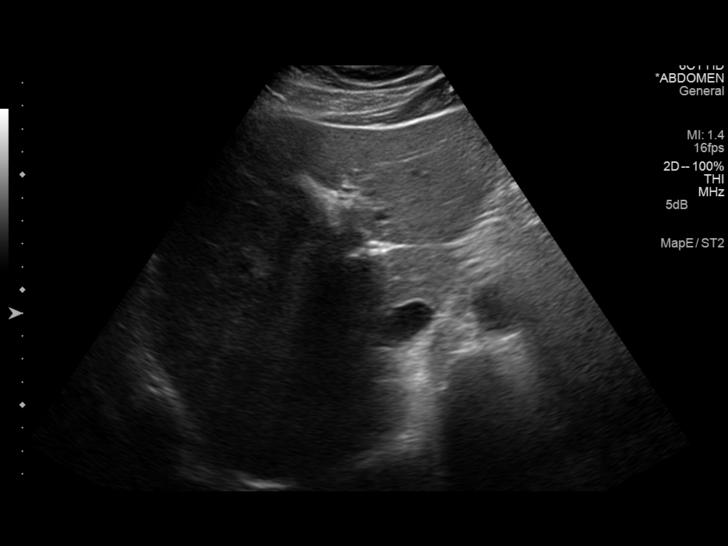
[im 41/76]
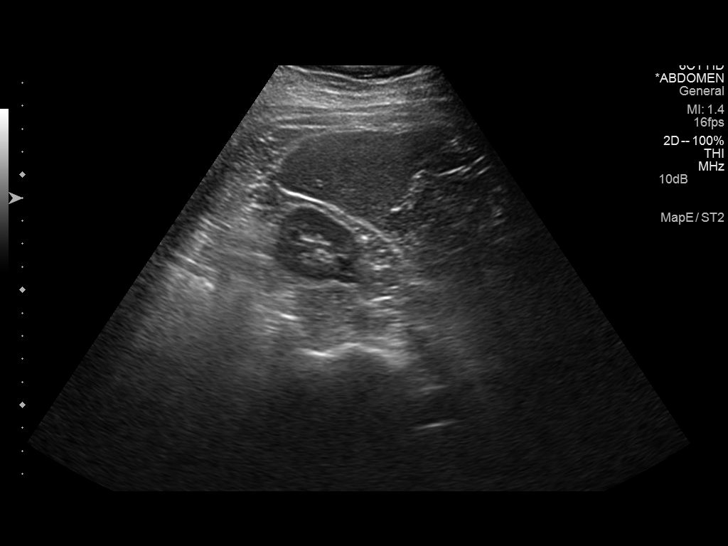
[im 47/76]
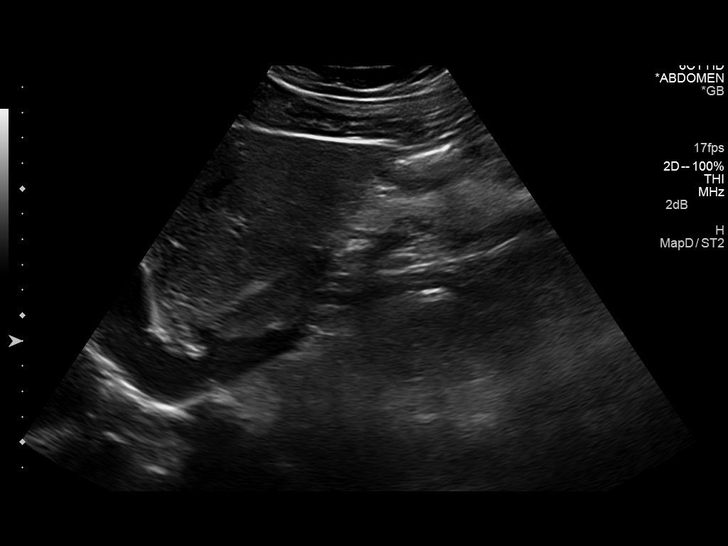
[im 51/76]
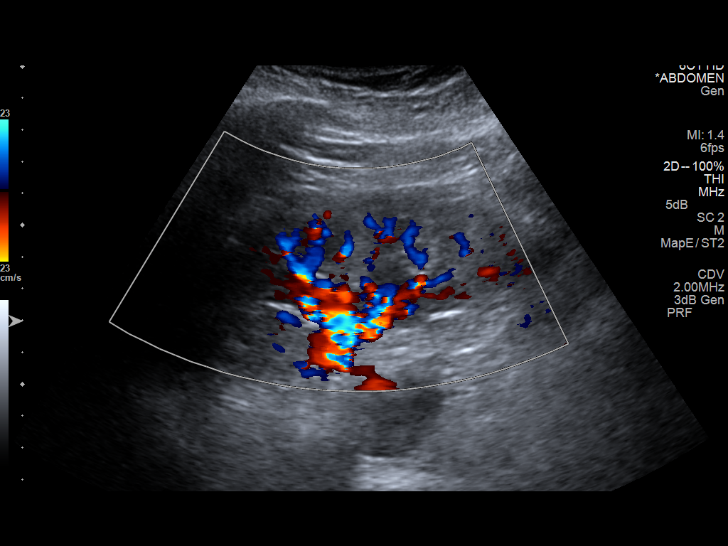
[im 57/76]
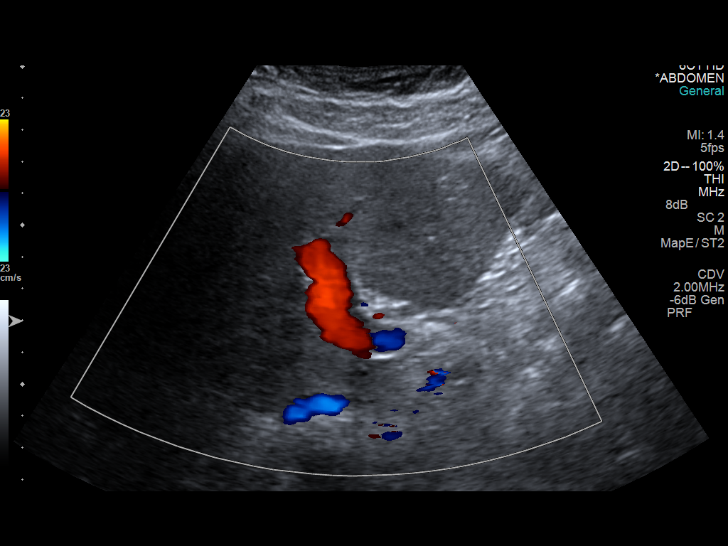
[im 63/76]
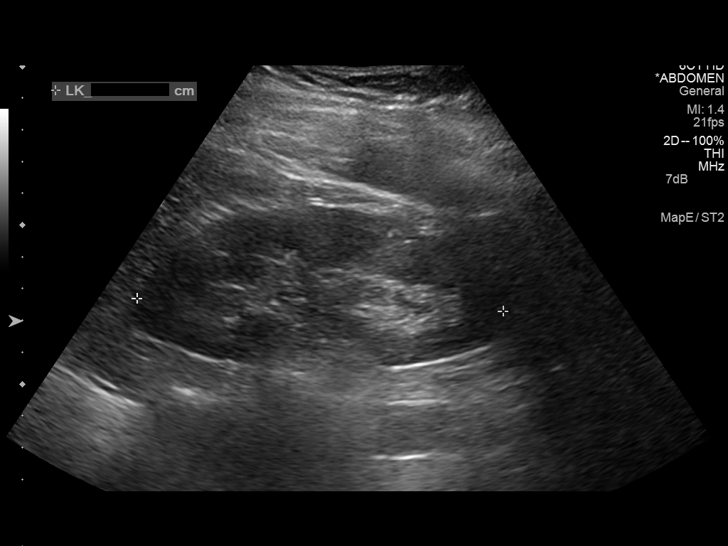
[im 69/76]
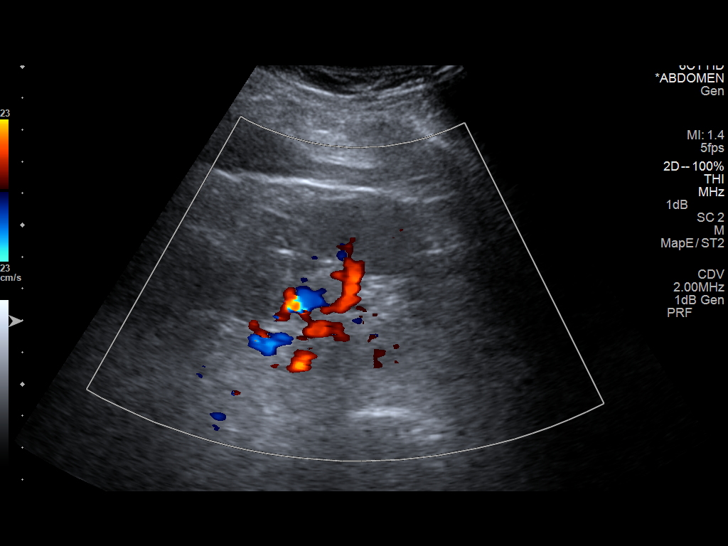
[im 76/76]
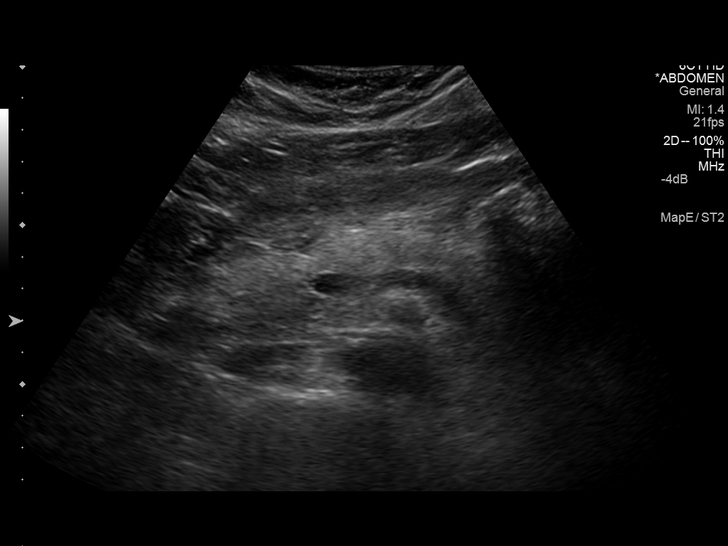

[14 of 25 positions shown; findings below may reference images not displayed]

FINDINGS: Gallbladder:

Prior cholecystectomy.

Common bile duct:

Diameter: 6.4 mm.

Liver:

Normal parenchymal echogenicity. Within the left lobe of liver there
is a 5 x 6 x 5 mm cyst.

IVC:

No abnormality visualized.

Pancreas:

Visualized portion unremarkable.

Spleen:

Size and appearance within normal limits. The spleen has a volume of
306 cubic cm.

Right Kidney:

Length: 12.2 cm. Echogenicity within normal limits. No mass or
hydronephrosis visualized.

Left Kidney:

Length: 11.5 cm. Echogenicity within normal limits. No mass or
hydronephrosis visualized.

Abdominal aorta:

No aneurysm visualized.

Other findings:

None.
IMPRESSION: 1. No acute findings.
2. Status post cholecystectomy.

## 2016-04-16 ENCOUNTER — Ambulatory Visit (INDEPENDENT_AMBULATORY_CARE_PROVIDER_SITE_OTHER): Payer: Medicare Other | Admitting: Family Medicine

## 2016-04-16 ENCOUNTER — Encounter: Payer: Self-pay | Admitting: Family Medicine

## 2016-04-16 VITALS — BP 118/60 | HR 76 | Temp 97.9°F | Resp 16 | Ht 69.5 in | Wt 175.0 lb

## 2016-04-16 DIAGNOSIS — E781 Pure hyperglyceridemia: Secondary | ICD-10-CM

## 2016-04-16 DIAGNOSIS — E559 Vitamin D deficiency, unspecified: Secondary | ICD-10-CM

## 2016-04-16 DIAGNOSIS — Z1272 Encounter for screening for malignant neoplasm of vagina: Secondary | ICD-10-CM | POA: Diagnosis not present

## 2016-04-16 DIAGNOSIS — Z1211 Encounter for screening for malignant neoplasm of colon: Secondary | ICD-10-CM | POA: Diagnosis not present

## 2016-04-16 DIAGNOSIS — R739 Hyperglycemia, unspecified: Secondary | ICD-10-CM

## 2016-04-16 DIAGNOSIS — Z126 Encounter for screening for malignant neoplasm of bladder: Secondary | ICD-10-CM

## 2016-04-16 DIAGNOSIS — Z0000 Encounter for general adult medical examination without abnormal findings: Secondary | ICD-10-CM | POA: Diagnosis not present

## 2016-04-16 LAB — POCT URINALYSIS DIPSTICK
BILIRUBIN UA: NEGATIVE
GLUCOSE UA: NEGATIVE
KETONES UA: NEGATIVE
Leukocytes, UA: NEGATIVE
NITRITE UA: NEGATIVE
RBC UA: NEGATIVE
SPEC GRAV UA: 1.01
Urobilinogen, UA: 0.2
pH, UA: 7

## 2016-04-16 LAB — IFOBT (OCCULT BLOOD): IFOBT: NEGATIVE

## 2016-04-16 NOTE — Progress Notes (Signed)
Patient: Jodi Day, Female    DOB: 11-Mar-1946, 70 y.o.   MRN: [id number] Visit Date: 04/16/2016  Today's Provider: Lorie Phenix, MD   Chief Complaint  Patient presents with  . Medicare Wellness   Subjective:    Annual wellness visit Jodi Day is a 70 y.o. female. She feels well. She reports exercising 3 times a week. Strength training and Pilates.. She reports she is sleeping well.  ----------------------------------------------------------- Last pap- 09/20/2013. Negative. HPV negative. Last mammo- 11/27/2015- BI-RADS 2 Last BMD- 11/27/2015- WNL. Recheck 3 years. Last colonoscopy- 02/03/2012- internal hemorrhoids, polyps. Performed at [facility]. Repeat 5 years. Prevnar- 01/08/2015 Pneumovax- 01/15/2016   Review of Systems  HENT: Positive for tinnitus.   Respiratory: Positive for shortness of breath.   Gastrointestinal: Positive for rectal pain.  Genitourinary: Positive for urgency.  Musculoskeletal: Positive for arthralgias.  Neurological: Positive for light-headedness.  Psychiatric/Behavioral: The patient is nervous/anxious.   All other systems reviewed and are negative.   Social History   Social History  . Marital Status: Married    Spouse Name: Thereasa Distance  . Number of Children: 2  . Years of Education: Associates   Occupational History  . 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 Smoking status: Former Games developer  . Smokeless tobacco: Never Used  . Alcohol Use: Yes     Comment: Wine Rarely  . Drug Use: No  . Sexual Activity: Not on file   Other Topics Concern  . Not on file   Social History Narrative    Past Medical History  Diagnosis Date  . Thyroid disease   . Anxiety      Patient Active Problem List   Diagnosis Date Noted  . Grief counseling 01/15/2016  . Anxiety 07/15/2015  . Cervical muscle strain 07/15/2015  . Dermatitis, eczematoid 07/15/2015  . Blood glucose elevated  07/15/2015  . Hypertriglyceridemia 07/15/2015  . Adult hypothyroidism 07/15/2015  . Hemorrhoids, internal 07/15/2015  . Contusion of knee 07/15/2015  . Symptomatic menopausal or female climacteric states 07/15/2015  . Avitaminosis D 07/15/2015  . Climacteric 07/15/2015  . Disease of skin and subcutaneous tissue 10/24/2012  . Big thyroid 07/04/2007  . Urge incontinence 07/04/2007  . 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 Cholecystectomy  1996  . Abdominal hysterectomy  1992    fibroids. ovaries intact  . Tonsillectomy and adenoidectomy  1973  . Thyroidectomy  1973    Her family history includes COPD in her father; Colon polyps in her brother; Diabetes in her mother; Hypothyroidism in her mother; Prostate cancer in her brother and brother; Stroke in her mother.    Previous Medications   ASCORBIC ACID (VITAMIN C) 1000 MG TABLET    Take 1 tablet by mouth daily.   CALCIUM CARBONATE-VIT D-MIN (CALCIUM 1200) 1200-1000 MG-UNIT CHEW    Chew 1 tablet by mouth daily.   ESTRADIOL-ESTRIOL-PROGESTERONE (BIEST/PROGESTERONE) CREA    Place 1 Squirt onto the skin daily.   LEVOTHYROXINE (SYNTHROID, LEVOTHROID) 150 MCG TABLET    Take 125 mcg by mouth daily before breakfast.    LIOTHYRONINE (CYTOMEL) 5 MCG TABLET    Take 5 mcg by mouth daily.   METFORMIN (GLUCOPHAGE) 500 MG TABLET    Take 1 tablet by mouth 2 (two) times daily. Reported on 04/16/2016   OMEGA-3 FATTY ACIDS PO    Take 1 capsule by mouth daily.  PROGESTERONE (PROMETRIUM) 200 MG CAPSULE    Take 1 capsule by mouth daily.   VITAMIN D, CHOLECALCIFEROL, 1000 UNITS CAPS    Take 1 capsule by mouth daily.   VITAMIN E 400 UNIT CAPSULE    Take 1 capsule by mouth daily.    Patient Care Team: Lorie PhenixNancy Shariq Puig, MD as PCP - General (Family Medicine)     Objective:   Vitals: BP 118/60 mmHg  Pulse 76  Temp(Src) 97.9 F (36.6 C) (Oral)  Resp 16  Ht 5' 9.5" (1.765 m)  Wt 175 lb (79.379 kg)  BMI 25.48  kg/m2  Physical Exam  Constitutional: She is oriented to person, place, and time. She appears well-developed and well-nourished.  HENT:  Head: Normocephalic and atraumatic.  Right Ear: Tympanic membrane, external ear and ear canal normal.  Left Ear: Tympanic membrane, external ear and ear canal normal.  Nose: Nose normal.  Mouth/Throat: Uvula is midline, oropharynx is clear and moist and mucous membranes are normal.  Eyes: Conjunctivae, EOM and lids are normal. Pupils are equal, round, and reactive to light.  Neck: Trachea normal and normal range of motion. Neck supple. Carotid bruit is not present.  Surgical absence of thyroid  Cardiovascular: Normal rate, regular rhythm and normal heart sounds.   Pulmonary/Chest: Effort normal and breath sounds normal.  Abdominal: Soft. Normal appearance and bowel sounds are normal. There is no hepatosplenomegaly. There is no tenderness.  Genitourinary: Vagina normal. No breast swelling, tenderness or discharge.  Musculoskeletal: Normal range of motion.  Lymphadenopathy:    She has no cervical adenopathy.    She has no axillary adenopathy.  Neurological: She is alert and oriented to person, place, and time. She has normal strength. No cranial nerve deficit.  Skin: Skin is warm, dry and intact.  Psychiatric: She has a normal mood and affect. Her speech is normal and behavior is normal. Judgment and thought content normal. Cognition and memory are normal.    Activities of Daily Living In your present state of health, do you have any difficulty performing the following activities: 04/16/2016  Hearing? N  Vision? N  Difficulty concentrating or making decisions? N  Walking or climbing stairs? N  Dressing or bathing? N  Doing errands, shopping? N    Fall Risk Assessment Fall Risk  04/16/2016  Falls in the past year? No     Depression Screen PHQ 2/9 Scores 04/16/2016  PHQ - 2 Score 0    Cognitive Testing - 6-CIT  Correct? Score   What year is it?  yes 0 0 or 4  What month is it? yes 0 0 or 3  Memorize:    [place],  [place],  44,  [address],  [place],      What time is it? (within 1 hour) yes 0 0 or 3  Count backwards from 20 yes 0 0, 2, or 4  Name the months of the year no 2 0, 2, or 4  Repeat name & address above yes 0 0, 2, 4, 6, 8, or 10       TOTAL SCORE  2/28   Interpretation:  Normal  Normal (0-7) Abnormal (8-28)       Assessment & Plan:     Annual Wellness Visit  Reviewed patient's Family Medical History Reviewed and updated list of patient's medical providers Assessment of cognitive impairment was done Assessed patient's functional ability Established a written schedule for health screening services Health Risk Assessent Completed and Reviewed  Exercise Activities and Dietary recommendations  Goals    None      Immunization History  Administered Date(s) Administered  . Pneumococcal Conjugate-13 01/08/2015  . Pneumococcal Polysaccharide-23 01/15/2016  . Tdap 07/01/2007  . Zoster 01/04/2009    Health Maintenance  Topic Date Due  . Hepatitis C Screening  [birth year]  . INFLUENZA VACCINE  06/16/2016  . TETANUS/TDAP  06/30/2017  . MAMMOGRAM  11/26/2017  . COLONOSCOPY  02/02/2022  . DEXA SCAN  Completed  . ZOSTAVAX  Completed  . PNA vac Low Risk Adult  Completed      Discussed health benefits of physical activity, and encouraged her to engage in regular exercise appropriate for her age and condition.    ------------------------------------------------------------------------------------------------------------  1. Annual physical exam Stable. As above.  Results for orders placed or performed in visit on 04/16/16  POCT urinalysis dipstick  Result Value Ref Range   Color, UA amber    Clarity, UA yellow    Glucose, UA Negative    Bilirubin, UA Negative    Ketones, UA Negative    Spec Grav, UA 1.010    Blood, UA Negative    pH, UA 7.0    Protein, UA trace    Urobilinogen, UA 0.2    Nitrite, UA  Negative    Leukocytes, UA Negative Negative  IFOBT POC (occult bld, rslt in office)  Result Value Ref Range   IFOBT Negative      2. Bladder cancer screening UA negative. - POCT urinalysis dipstick  3. Avitaminosis D Pt has H/O this. FU pending results. - VITAMIN D 25 Hydroxy (Vit-D Deficiency, Fractures)  4. Blood glucose elevated Pt has H/O this. FU pending results. - Comprehensive metabolic panel - Hemoglobin A1c  5. Hypertriglyceridemia Pt has H/O this. FU pending results. - CBC with Differential/Platelet - Lipid panel  6. Screening for vaginal cancer Pt requesting pap today. FU pending results. - Pap IG (Image Guided)  7. Colon cancer screening OC Lite negative. Needs colonoscopy next year. - IFOBT POC (occult bld, rslt in office)   Patient seen and examined by Leo Grosser, MD, and note scribed by Allene Dillon, CMA.   I have reviewed the document for accuracy and completeness and I agree with above. Leo Grosser, MD   Lorie Phenix, MD

## 2016-04-22 ENCOUNTER — Telehealth: Payer: Self-pay

## 2016-04-22 LAB — PAP IG (IMAGE GUIDED): PAP SMEAR COMMENT: 0

## 2016-04-22 NOTE — Telephone Encounter (Signed)
LMTCB 04/22/2016  Thanks,   -Vernona RiegerLaura

## 2016-04-22 NOTE — Telephone Encounter (Signed)
Pt returned your call [phone]  [facility]

## 2016-04-22 NOTE — Telephone Encounter (Signed)
-----   Message from Lorie PhenixNancy Maloney, MD sent at 04/22/2016  7:30 AM EDT ----- Pap is normal. Please notify patient.   Thanks.

## 2016-04-23 NOTE — Telephone Encounter (Signed)
Pt advised.   Thanks,   -Laura  

## 2016-04-30 DIAGNOSIS — E559 Vitamin D deficiency, unspecified: Secondary | ICD-10-CM | POA: Diagnosis not present

## 2016-04-30 DIAGNOSIS — R739 Hyperglycemia, unspecified: Secondary | ICD-10-CM | POA: Diagnosis not present

## 2016-04-30 DIAGNOSIS — E781 Pure hyperglyceridemia: Secondary | ICD-10-CM | POA: Diagnosis not present

## 2016-05-01 ENCOUNTER — Telehealth: Payer: Self-pay

## 2016-05-01 LAB — HEMOGLOBIN A1C
Est. average glucose Bld gHb Est-mCnc: 108 mg/dL
Hgb A1c MFr Bld: 5.4 % (ref 4.8–5.6)

## 2016-05-01 LAB — COMPREHENSIVE METABOLIC PANEL
ALT: 23 IU/L (ref 0–32)
AST: 17 IU/L (ref 0–40)
Albumin/Globulin Ratio: 1.6 (ref 1.2–2.2)
Albumin: 4.2 g/dL (ref 3.6–4.8)
Alkaline Phosphatase: 70 IU/L (ref 39–117)
BUN/Creatinine Ratio: 22 (ref 12–28)
BUN: 14 mg/dL (ref 8–27)
Bilirubin Total: 0.4 mg/dL (ref 0.0–1.2)
CO2: 22 mmol/L (ref 18–29)
Calcium: 9.7 mg/dL (ref 8.7–10.3)
Chloride: 103 mmol/L (ref 96–106)
Creatinine, Ser: 0.64 mg/dL (ref 0.57–1.00)
GFR calc Af Amer: 107 mL/min/{1.73_m2} (ref >59)
GFR calc non Af Amer: 93 mL/min/{1.73_m2} (ref >59)
Globulin, Total: 2.6 g/dL (ref 1.5–4.5)
Glucose: 104 mg/dL — ABNORMAL HIGH (ref 65–99)
Potassium: 4.4 mmol/L (ref 3.5–5.2)
Sodium: 143 mmol/L (ref 134–144)
Total Protein: 6.8 g/dL (ref 6.0–8.5)

## 2016-05-01 LAB — CBC WITH DIFFERENTIAL/PLATELET
Basophils Absolute: 0 10*3/uL (ref 0.0–0.2)
Basos: 1 %
EOS (ABSOLUTE): 0.2 10*3/uL (ref 0.0–0.4)
Eos: 3 %
Hematocrit: 39.6 % (ref 34.0–46.6)
Hemoglobin: 13.3 g/dL (ref 11.1–15.9)
Immature Grans (Abs): 0 10*3/uL (ref 0.0–0.1)
Immature Granulocytes: 0 %
Lymphocytes Absolute: 1.6 10*3/uL (ref 0.7–3.1)
Lymphs: 27 %
MCH: 28.5 pg (ref 26.6–33.0)
MCHC: 33.6 g/dL (ref 31.5–35.7)
MCV: 85 fL (ref 79–97)
Monocytes Absolute: 0.7 10*3/uL (ref 0.1–0.9)
Monocytes: 11 %
Neutrophils Absolute: 3.6 10*3/uL (ref 1.4–7.0)
Neutrophils: 58 %
Platelets: 319 10*3/uL (ref 150–379)
RBC: 4.67 x10E6/uL (ref 3.77–5.28)
RDW: 13.9 % (ref 12.3–15.4)
WBC: 6.1 10*3/uL (ref 3.4–10.8)

## 2016-05-01 LAB — LIPID PANEL
CHOLESTEROL TOTAL: 173 mg/dL (ref 100–199)
Chol/HDL Ratio: 3.1 ratio units (ref 0.0–4.4)
HDL: 55 mg/dL (ref >39)
LDL CALC: 86 mg/dL (ref 0–99)
Triglycerides: 160 mg/dL — ABNORMAL HIGH (ref 0–149)
VLDL CHOLESTEROL CAL: 32 mg/dL (ref 5–40)

## 2016-05-01 LAB — VITAMIN D 25 HYDROXY (VIT D DEFICIENCY, FRACTURES): Vit D, 25-Hydroxy: 94.8 ng/mL (ref 30.0–100.0)

## 2016-05-01 NOTE — Telephone Encounter (Signed)
-----   Message from Lorie PhenixNancy Maloney, MD sent at 05/01/2016  7:26 AM EDT ----- Labs stable. Please notify patient. Thanks.

## 2016-05-01 NOTE — Telephone Encounter (Signed)
Pt advised.   Thanks,   -Jasn Xia  

## 2016-05-21 DIAGNOSIS — E559 Vitamin D deficiency, unspecified: Secondary | ICD-10-CM | POA: Diagnosis not present

## 2016-05-21 DIAGNOSIS — E7800 Pure hypercholesterolemia, unspecified: Secondary | ICD-10-CM | POA: Diagnosis not present

## 2016-05-21 DIAGNOSIS — N951 Menopausal and female climacteric states: Secondary | ICD-10-CM | POA: Diagnosis not present

## 2016-05-21 DIAGNOSIS — E039 Hypothyroidism, unspecified: Secondary | ICD-10-CM | POA: Diagnosis not present

## 2016-05-21 DIAGNOSIS — E641 Sequelae of vitamin A deficiency: Secondary | ICD-10-CM | POA: Diagnosis not present

## 2016-05-21 DIAGNOSIS — R739 Hyperglycemia, unspecified: Secondary | ICD-10-CM | POA: Diagnosis not present

## 2016-06-09 DIAGNOSIS — E890 Postprocedural hypothyroidism: Secondary | ICD-10-CM | POA: Diagnosis not present

## 2016-06-09 DIAGNOSIS — E7800 Pure hypercholesterolemia, unspecified: Secondary | ICD-10-CM | POA: Diagnosis not present

## 2016-06-09 DIAGNOSIS — I2510 Atherosclerotic heart disease of native coronary artery without angina pectoris: Secondary | ICD-10-CM | POA: Diagnosis not present

## 2016-06-09 DIAGNOSIS — D508 Other iron deficiency anemias: Secondary | ICD-10-CM | POA: Diagnosis not present

## 2016-07-10 ENCOUNTER — Encounter: Payer: Self-pay | Admitting: Family Medicine

## 2016-07-10 ENCOUNTER — Ambulatory Visit (INDEPENDENT_AMBULATORY_CARE_PROVIDER_SITE_OTHER): Payer: Medicare Other | Admitting: Family Medicine

## 2016-07-10 VITALS — BP 114/62 | HR 68 | Temp 98.4°F | Resp 16 | Wt 174.6 lb

## 2016-07-10 DIAGNOSIS — N3090 Cystitis, unspecified without hematuria: Secondary | ICD-10-CM | POA: Diagnosis not present

## 2016-07-10 LAB — POCT URINALYSIS DIPSTICK
BILIRUBIN UA: NEGATIVE
Glucose, UA: NEGATIVE
Ketones, UA: NEGATIVE
NITRITE UA: NEGATIVE
PH UA: 5
Spec Grav, UA: 1.015
UROBILINOGEN UA: 0.2

## 2016-07-10 MED ORDER — SULFAMETHOXAZOLE-TRIMETHOPRIM 800-160 MG PO TABS: 1.0000 | ORAL_TABLET | Freq: Two times a day (BID) | ORAL | 0 refills | Status: DC

## 2016-07-10 NOTE — Patient Instructions (Signed)
We will call you with the culture results. May try AZO or Uristat for your symptoms.

## 2016-07-10 NOTE — Progress Notes (Signed)
Subjective:     Patient ID: Jodi Day, female   DOB: 03/31/1946, 70 y.o.   MRN: [id number]  HPI  Chief Complaint  Patient presents with  . Urinary Tract Infection    Patient comes in office today with concerns of pelvic pressure, dysuria and frequency for the past 3 days.   States it has been a while since she had a bladder infection.   Review of Systems  Constitutional: Negative for chills and fever.       Objective:   Physical Exam  Constitutional: She appears well-developed and well-nourished. No distress.  Genitourinary:  Genitourinary Comments: No CVA tenderness       Assessment:    1. Cystitis - POCT urinalysis dipstick - Urine culture - sulfamethoxazole-trimethoprim (BACTRIM DS,SEPTRA DS) 800-160 MG tablet; Take 1 tablet by mouth 2 (two) times daily.  Dispense: 14 tablet; Refill: 0    Plan:    Further f/u pending culture report.

## 2016-07-13 LAB — SPECIMEN STATUS REPORT

## 2016-07-14 LAB — URINE CULTURE

## 2016-08-03 DIAGNOSIS — N951 Menopausal and female climacteric states: Secondary | ICD-10-CM | POA: Diagnosis not present

## 2016-08-03 DIAGNOSIS — E039 Hypothyroidism, unspecified: Secondary | ICD-10-CM | POA: Diagnosis not present

## 2016-08-03 DIAGNOSIS — I2510 Atherosclerotic heart disease of native coronary artery without angina pectoris: Secondary | ICD-10-CM | POA: Diagnosis not present

## 2016-08-03 DIAGNOSIS — D508 Other iron deficiency anemias: Secondary | ICD-10-CM | POA: Diagnosis not present

## 2016-08-05 DIAGNOSIS — Z23 Encounter for immunization: Secondary | ICD-10-CM | POA: Diagnosis not present

## 2016-08-10 DIAGNOSIS — N951 Menopausal and female climacteric states: Secondary | ICD-10-CM | POA: Diagnosis not present

## 2016-08-26 DIAGNOSIS — E890 Postprocedural hypothyroidism: Secondary | ICD-10-CM | POA: Diagnosis not present

## 2016-09-02 DIAGNOSIS — E890 Postprocedural hypothyroidism: Secondary | ICD-10-CM | POA: Diagnosis not present

## 2016-10-01 DIAGNOSIS — E890 Postprocedural hypothyroidism: Secondary | ICD-10-CM | POA: Diagnosis not present

## 2016-10-14 DIAGNOSIS — E890 Postprocedural hypothyroidism: Secondary | ICD-10-CM | POA: Diagnosis not present

## 2016-10-26 DIAGNOSIS — Z1283 Encounter for screening for malignant neoplasm of skin: Secondary | ICD-10-CM | POA: Diagnosis not present

## 2016-10-26 DIAGNOSIS — B351 Tinea unguium: Secondary | ICD-10-CM | POA: Diagnosis not present

## 2016-10-26 DIAGNOSIS — L400 Psoriasis vulgaris: Secondary | ICD-10-CM | POA: Diagnosis not present

## 2016-11-25 DIAGNOSIS — Z1231 Encounter for screening mammogram for malignant neoplasm of breast: Secondary | ICD-10-CM | POA: Diagnosis not present

## 2017-01-01 DIAGNOSIS — E890 Postprocedural hypothyroidism: Secondary | ICD-10-CM | POA: Diagnosis not present

## 2017-01-14 DIAGNOSIS — E890 Postprocedural hypothyroidism: Secondary | ICD-10-CM | POA: Diagnosis not present

## 2017-02-01 ENCOUNTER — Telehealth: Payer: Self-pay

## 2017-02-01 DIAGNOSIS — Z1211 Encounter for screening for malignant neoplasm of colon: Secondary | ICD-10-CM

## 2017-02-01 NOTE — Telephone Encounter (Signed)
Patient states she need a referral to GI for her Colonoscopy with Dr. Markham JordanElliot.  Pt last Colon[facility] was 02/03/2012 [facility]. She reports that her last CPE  Was 06/01 with Dr. Elease HashimotoMaloney.   Please advise.  Thanks,  -Kimiye Strathman

## 2017-02-01 NOTE — Telephone Encounter (Signed)
Placed referral for colonoscopy with Dr. Mechele CollinElliott.

## 2017-02-23 DIAGNOSIS — N951 Menopausal and female climacteric states: Secondary | ICD-10-CM | POA: Diagnosis not present

## 2017-03-16 DIAGNOSIS — Z8371 Family history of colonic polyps: Secondary | ICD-10-CM | POA: Diagnosis not present

## 2017-03-16 DIAGNOSIS — R197 Diarrhea, unspecified: Secondary | ICD-10-CM | POA: Diagnosis not present

## 2017-03-31 DIAGNOSIS — N958 Other specified menopausal and perimenopausal disorders: Secondary | ICD-10-CM | POA: Diagnosis not present

## 2017-04-29 DIAGNOSIS — E890 Postprocedural hypothyroidism: Secondary | ICD-10-CM | POA: Diagnosis not present

## 2017-05-10 DIAGNOSIS — Z961 Presence of intraocular lens: Secondary | ICD-10-CM | POA: Diagnosis not present

## 2017-05-10 DIAGNOSIS — H2181 Floppy iris syndrome: Secondary | ICD-10-CM | POA: Diagnosis not present

## 2017-05-10 DIAGNOSIS — H21231 Degeneration of iris (pigmentary), right eye: Secondary | ICD-10-CM | POA: Diagnosis not present

## 2017-05-10 DIAGNOSIS — E890 Postprocedural hypothyroidism: Secondary | ICD-10-CM | POA: Diagnosis not present

## 2017-05-10 DIAGNOSIS — H6121 Impacted cerumen, right ear: Secondary | ICD-10-CM | POA: Diagnosis not present

## 2017-05-10 DIAGNOSIS — H43392 Other vitreous opacities, left eye: Secondary | ICD-10-CM | POA: Diagnosis not present

## 2017-05-21 DIAGNOSIS — R5383 Other fatigue: Secondary | ICD-10-CM | POA: Diagnosis not present

## 2017-06-02 DIAGNOSIS — N951 Menopausal and female climacteric states: Secondary | ICD-10-CM | POA: Diagnosis not present

## 2017-07-02 DIAGNOSIS — E890 Postprocedural hypothyroidism: Secondary | ICD-10-CM | POA: Diagnosis not present

## 2017-07-06 DIAGNOSIS — E890 Postprocedural hypothyroidism: Secondary | ICD-10-CM | POA: Diagnosis not present

## 2017-08-24 DIAGNOSIS — Z23 Encounter for immunization: Secondary | ICD-10-CM | POA: Diagnosis not present

## 2017-09-22 ENCOUNTER — Ambulatory Visit (INDEPENDENT_AMBULATORY_CARE_PROVIDER_SITE_OTHER): Payer: Medicare Other

## 2017-09-22 VITALS — BP 124/80 | HR 68 | Temp 98.5°F | Ht 70.0 in | Wt 182.2 lb

## 2017-09-22 DIAGNOSIS — Z0000 Encounter for general adult medical examination without abnormal findings: Secondary | ICD-10-CM | POA: Diagnosis not present

## 2017-09-22 NOTE — Patient Instructions (Signed)
[name] , Thank you for taking time to come for your Medicare Wellness Visit. I appreciate your ongoing commitment to your health goals. Please review the following plan we discussed and let me know if I can assist you in the future.   Screening recommendations/referrals: Colonoscopy: Up to date Mammogram: Up to date Bone Density: Up to date Recommended yearly ophthalmology/optometry visit for glaucoma screening and checkup Recommended yearly dental visit for hygiene and checkup  Vaccinations: Influenza vaccine: complete Pneumococcal vaccine: completed series Tdap vaccine: declined Shingles vaccine: completed 12/2008  Advanced directives: Advance directive discussed with you today. Even though you declined this today please call our office should you change your mind and we can give you the proper paperwork for you to fill out.  Conditions/risks identified: Recommend increasing water intake to 6-8 glasses a day.   Next appointment: 10/20/17 @ 3:00 PM   Preventive Care 65 Years and Older, Female Preventive care refers to lifestyle choices and visits with your health care provider that can promote health and wellness. What does preventive care include?  A yearly physical exam. This is also called an annual well check.  Dental exams once or twice a year.  Routine eye exams. Ask your health care provider how often you should have your eyes checked.  Personal lifestyle choices, including:  Daily care of your teeth and gums.  Regular physical activity.  Eating a healthy diet.  Avoiding tobacco and drug use.  Limiting alcohol use.  Practicing safe sex.  Taking low-dose aspirin every day.  Taking vitamin and mineral supplements as recommended by your health care provider. What happens during an annual well check? The services and screenings done by your health care provider during your annual well check will depend on your age, overall health, lifestyle risk factors, and  family history of disease. Counseling  Your health care provider may ask you questions about your:  Alcohol use.  Tobacco use.  Drug use.  Emotional well-being.  Home and relationship well-being.  Sexual activity.  Eating habits.  History of falls.  Memory and ability to understand (cognition).  Work and work Astronomerenvironment.  Reproductive health. Screening  You may have the following tests or measurements:  Height, weight, and BMI.  Blood pressure.  Lipid and cholesterol levels. These may be checked every 5 years, or more frequently if you are over 318 years old.  Skin check.  Lung cancer screening. You may have this screening every year starting at age 69 if you have a 30-pack-year history of smoking and currently smoke or have quit within the past 15 years.  Fecal occult blood test (FOBT) of the stool. You may have this test every year starting at age 69.  Flexible sigmoidoscopy or colonoscopy. You may have a sigmoidoscopy every 5 years or a colonoscopy every 10 years starting at age 69.  Hepatitis C blood test.  Hepatitis B blood test.  Sexually transmitted disease (STD) testing.  Diabetes screening. This is done by checking your blood sugar (glucose) after you have not eaten for a while (fasting). You may have this done every 1-3 years.  Bone density scan. This is done to screen for osteoporosis. You may have this done starting at age 69.  Mammogram. This may be done every 1-2 years. Talk to your health care provider about how often you should have regular mammograms. Talk with your health care provider about your test results, treatment options, and if necessary, the need for more tests. Vaccines  Your health  care provider may recommend certain vaccines, such as:  Influenza vaccine. This is recommended every year.  Tetanus, diphtheria, and acellular pertussis (Tdap, Td) vaccine. You may need a Td booster every 10 years.  Zoster vaccine. You may need this  after age 22.  Pneumococcal 13-valent conjugate (PCV13) vaccine. One dose is recommended after age 52.  Pneumococcal polysaccharide (PPSV23) vaccine. One dose is recommended after age 9. Talk to your health care provider about which screenings and vaccines you need and how often you need them. This information is not intended to replace advice given to you by your health care provider. Make sure you discuss any questions you have with your health care provider. Document Released: 11/29/2015 Document Revised: 07/22/2016 Document Reviewed: 09/03/2015 Elsevier Interactive Patient Education  2017 [place] Prevention in the Home Falls can cause injuries. They can happen to people of all ages. There are many things you can do to make your home safe and to help prevent falls. What can I do on the outside of my home?  Regularly fix the edges of walkways and driveways and fix any cracks.  Remove anything that might make you trip as you walk through a door, such as a raised step or threshold.  Trim any bushes or trees on the path to your home.  Use bright outdoor lighting.  Clear any walking paths of anything that might make someone trip, such as rocks or tools.  Regularly check to see if handrails are loose or broken. Make sure that both sides of any steps have handrails.  Any raised decks and porches should have guardrails on the edges.  Have any leaves, snow, or ice cleared regularly.  Use sand or salt on walking paths during winter.  Clean up any spills in your garage right away. This includes oil or grease spills. What can I do in the bathroom?  Use night lights.  Install grab bars by the toilet and in the tub and shower. Do not use towel bars as grab bars.  Use non-skid mats or decals in the tub or shower.  If you need to sit down in the shower, use a plastic, non-slip stool.  Keep the floor dry. Clean up any water that spills on the floor as soon as it  happens.  Remove soap buildup in the tub or shower regularly.  Attach bath mats securely with double-sided non-slip rug tape.  Do not have throw rugs and other things on the floor that can make you trip. What can I do in the bedroom?  Use night lights.  Make sure that you have a light by your bed that is easy to reach.  Do not use any sheets or blankets that are too big for your bed. They should not hang down onto the floor.  Have a firm chair that has side arms. You can use this for support while you get dressed.  Do not have throw rugs and other things on the floor that can make you trip. What can I do in the kitchen?  Clean up any spills right away.  Avoid walking on wet floors.  Keep items that you use a lot in easy-to-reach places.  If you need to reach something above you, use a strong step stool that has a grab bar.  Keep electrical cords out of the way.  Do not use floor polish or wax that makes floors slippery. If you must use wax, use non-skid floor wax.  Do not have  throw rugs and other things on the floor that can make you trip. What can I do with my stairs?  Do not leave any items on the stairs.  Make sure that there are handrails on both sides of the stairs and use them. Fix handrails that are broken or loose. Make sure that handrails are as long as the stairways.  Check any carpeting to make sure that it is firmly attached to the stairs. Fix any carpet that is loose or worn.  Avoid having throw rugs at the top or bottom of the stairs. If you do have throw rugs, attach them to the floor with carpet tape.  Make sure that you have a light switch at the top of the stairs and the bottom of the stairs. If you do not have them, ask someone to add them for you. What else can I do to help prevent falls?  Wear shoes that:  Do not have high heels.  Have rubber bottoms.  Are comfortable and fit you well.  Are closed at the toe. Do not wear sandals.  If you  use a stepladder:  Make sure that it is fully opened. Do not climb a closed stepladder.  Make sure that both sides of the stepladder are locked into place.  Ask someone to hold it for you, if possible.  Clearly mark and make sure that you can see:  Any grab bars or handrails.  First and last steps.  Where the edge of each step is.  Use tools that help you move around (mobility aids) if they are needed. These include:  Canes.  Walkers.  Scooters.  Crutches.  Turn on the lights when you go into a dark area. Replace any light bulbs as soon as they burn out.  Set up your furniture so you have a clear path. Avoid moving your furniture around.  If any of your floors are uneven, fix them.  If there are any pets around you, be aware of where they are.  Review your medicines with your doctor. Some medicines can make you feel dizzy. This can increase your chance of falling. Ask your doctor what other things that you can do to help prevent falls. This information is not intended to replace advice given to you by your health care provider. Make sure you discuss any questions you have with your health care provider. Document Released: 08/29/2009 Document Revised: 04/09/2016 Document Reviewed: 12/07/2014 Elsevier Interactive Patient Education  2017 [employer].

## 2017-09-22 NOTE — Progress Notes (Signed)
Subjective:   Jodi Day is a 71 y.o. female who presents for Medicare Annual (Subsequent) preventive examination.  Review of Systems:  N/A  Cardiac Risk Factors include: advanced age (>6955men, 38>65 women)     Objective:     Vitals: BP 124/80 (BP Location: Left Arm)   Pulse 68   Temp 98.5 F (36.9 C) (Oral)   Ht 5\' 10"  (1.778 m)   Wt 182 lb 3.2 oz (82.6 kg)   BMI 26.14 kg/m   Body mass index is 26.14 kg/m.   Tobacco Social History   Tobacco Use  Smoking Status Former Smoker  Smokeless Tobacco Never Used     Counseling given: Not Answered   Past Medical History:  Diagnosis Date  . Anxiety   . Thyroid disease    Past Surgical History:  Procedure Laterality Date  . ABDOMINAL HYSTERECTOMY  1992   fibroids. ovaries intact  . CHOLECYSTECTOMY  1996  . thyroidectomy  1973  . tonsillectomy and adenoidectomy  1973   Family History  Problem Relation Age of Onset  . Diabetes Mother   . Hypothyroidism Mother   . Stroke Mother   . COPD Father   . Prostate cancer Brother   . Pneumonia Brother   . Prostate cancer Brother   . Colon polyps Brother    Social History   Substance and Sexual Activity  Sexual Activity Not on file    Outpatient Encounter Medications as of 09/22/2017  Medication Sig  . Calcium Carbonate-Vit D-Min (CALCIUM 1200) 1200-1000 MG-UNIT CHEW Chew 1 tablet by mouth daily.  Marland Kitchen. liothyronine (CYTOMEL) 5 MCG tablet Take 5 mcg by mouth daily.  . Menthol, Topical Analgesic, (BIOFREEZE EX) Apply as needed topically.  . metFORMIN (GLUCOPHAGE) 500 MG tablet Take 1 tablet by mouth 2 (two) times daily. Reported on 04/16/2016  . progesterone (PROMETRIUM) 100 MG capsule Take 100 mg daily by mouth.   . SYNTHROID 137 MCG tablet   . Vitamin D, Cholecalciferol, 1000 UNITS CAPS Take 1,000 Units daily by mouth.   . vitamin E 400 UNIT capsule Take 1 capsule by mouth daily.  Marland Kitchen. VITAMIN K PO Take daily by mouth.  . [DISCONTINUED] levothyroxine (SYNTHROID,  LEVOTHROID) 150 MCG tablet Take 137 mcg daily before breakfast by mouth.   . [DISCONTINUED] Estradiol-Estriol-Progesterone (BIEST/PROGESTERONE) CREA Place 1 Squirt onto the skin daily.  . [DISCONTINUED] metFORMIN (GLUCOPHAGE-XR) 500 MG 24 hr tablet   . [DISCONTINUED] sulfamethoxazole-trimethoprim (BACTRIM DS,SEPTRA DS) 800-160 MG tablet Take 1 tablet by mouth 2 (two) times daily.   No facility-administered encounter medications on file as of 09/22/2017.     Activities of Daily Living In your present state of health, do you have any difficulty performing the following activities: 09/22/2017  Hearing? Y  Comment due to tinnitus  Vision? N  Difficulty concentrating or making decisions? N  Walking or climbing stairs? N  Dressing or bathing? N  Doing errands, shopping? N  Preparing Food and eating ? N  Using the Toilet? N  In the past six months, have you accidently leaked urine? N  Do you have problems with loss of bowel control? N  Managing your Medications? N  Managing your Finances? N  Housekeeping or managing your Housekeeping? N  Some recent data might be hidden    Patient Care Team: Erasmo DownerBacigalupo, Angela M, MD as PCP - General (Family Medicine) Mia CreekVaughan, Elizabeth, MD as Referring Physician (Internal Medicine) Tyrone SchimkeStonecipher, Karl, MD as Consulting Physician (Ophthalmology)    Assessment:  Exercise Activities and Dietary recommendations Current Exercise Habits: Structured exercise class, Type of exercise: Other - see comments;strength training/weights;stretching;exercise ball(pilates), Time (Minutes): 60, Frequency (Times/Week): 3, Weekly Exercise (Minutes/Week): 180, Intensity: Moderate, Exercise limited by: None identified  Goals    None     Fall Risk Fall Risk  09/22/2017 04/16/2016  Falls in the past year? No No   Depression Screen PHQ 2/9 Scores 09/22/2017 09/22/2017 04/16/2016  PHQ - 2 Score 0 0 0  PHQ- 9 Score 2 - -     Cognitive Function     6CIT Screen 09/22/2017    What Year? 0 points  What month? 0 points  What time? 0 points  Count back from 20 0 points  Months in reverse 2 points  Repeat phrase 0 points  Total Score 2    Immunization History  Administered Date(s) Administered  . Pneumococcal Conjugate-13 01/08/2015  . Pneumococcal Polysaccharide-23 01/15/2016  . Tdap 07/01/2007  . Zoster 01/04/2009   Screening Tests Health Maintenance  Topic Date Due  . TETANUS/TDAP  06/30/2017  . MAMMOGRAM  11/26/2017  . COLONOSCOPY  02/02/2022  . INFLUENZA VACCINE  Completed  . DEXA SCAN  Completed  . Hepatitis C Screening  Completed  . PNA vac Low Risk Adult  Completed      Plan:  I have personally reviewed and addressed the Medicare Annual Wellness questionnaire and have noted the following in the patient's chart:  A. Medical and social history B. Use of alcohol, tobacco or illicit drugs  C. Current medications and supplements D. Functional ability and status E.  Nutritional status F.  Physical activity G. Advance directives H. List of other physicians I.  Hospitalizations, surgeries, and ER visits in previous 12 months J.  Vitals K. Screenings such as hearing and vision if needed, cognitive and depression L. Referrals and appointments - none  In addition, I have reviewed and discussed with patient certain preventive protocols, quality metrics, and best practice recommendations. A written personalized care plan for preventive services as well as general preventive health recommendations were provided to patient.  See attached scanned questionnaire for additional information.   Signed,  Hyacinth MeekerMckenzie Nassim Cosma, LPN Nurse Health Advisor   MD Recommendations: None. Pt declined the tetanus vaccine today. Will follow up on receiving this at next OV on 10/20/17.

## 2017-09-26 DIAGNOSIS — R350 Frequency of micturition: Secondary | ICD-10-CM | POA: Diagnosis not present

## 2017-09-27 DIAGNOSIS — E039 Hypothyroidism, unspecified: Secondary | ICD-10-CM | POA: Diagnosis not present

## 2017-10-11 DIAGNOSIS — N951 Menopausal and female climacteric states: Secondary | ICD-10-CM | POA: Diagnosis not present

## 2017-10-11 DIAGNOSIS — L400 Psoriasis vulgaris: Secondary | ICD-10-CM | POA: Diagnosis not present

## 2017-10-11 DIAGNOSIS — K130 Diseases of lips: Secondary | ICD-10-CM | POA: Diagnosis not present

## 2017-10-13 DIAGNOSIS — R6882 Decreased libido: Secondary | ICD-10-CM | POA: Diagnosis not present

## 2017-10-13 DIAGNOSIS — R5383 Other fatigue: Secondary | ICD-10-CM | POA: Diagnosis not present

## 2017-10-13 DIAGNOSIS — M2550 Pain in unspecified joint: Secondary | ICD-10-CM | POA: Diagnosis not present

## 2017-10-20 ENCOUNTER — Ambulatory Visit (INDEPENDENT_AMBULATORY_CARE_PROVIDER_SITE_OTHER): Payer: Medicare Other | Admitting: Family Medicine

## 2017-10-20 ENCOUNTER — Encounter: Payer: Self-pay | Admitting: Family Medicine

## 2017-10-20 VITALS — BP 122/66 | HR 67 | Temp 98.3°F | Resp 16 | Ht 70.0 in | Wt 179.0 lb

## 2017-10-20 DIAGNOSIS — Z1211 Encounter for screening for malignant neoplasm of colon: Secondary | ICD-10-CM | POA: Diagnosis not present

## 2017-10-20 DIAGNOSIS — M2560 Stiffness of unspecified joint, not elsewhere classified: Secondary | ICD-10-CM | POA: Diagnosis not present

## 2017-10-20 DIAGNOSIS — L409 Psoriasis, unspecified: Secondary | ICD-10-CM

## 2017-10-20 DIAGNOSIS — E039 Hypothyroidism, unspecified: Secondary | ICD-10-CM | POA: Diagnosis not present

## 2017-10-20 DIAGNOSIS — Z0000 Encounter for general adult medical examination without abnormal findings: Secondary | ICD-10-CM

## 2017-10-20 DIAGNOSIS — Z1239 Encounter for other screening for malignant neoplasm of breast: Secondary | ICD-10-CM

## 2017-10-20 DIAGNOSIS — Z1231 Encounter for screening mammogram for malignant neoplasm of breast: Secondary | ICD-10-CM

## 2017-10-20 DIAGNOSIS — M2550 Pain in unspecified joint: Secondary | ICD-10-CM | POA: Insufficient documentation

## 2017-10-20 DIAGNOSIS — E2839 Other primary ovarian failure: Secondary | ICD-10-CM

## 2017-10-20 DIAGNOSIS — E781 Pure hyperglyceridemia: Secondary | ICD-10-CM

## 2017-10-20 DIAGNOSIS — Z23 Encounter for immunization: Secondary | ICD-10-CM

## 2017-10-20 DIAGNOSIS — R739 Hyperglycemia, unspecified: Secondary | ICD-10-CM | POA: Diagnosis not present

## 2017-10-20 NOTE — Patient Instructions (Signed)
Preventive Care 65 Years and Older, Female Preventive care refers to lifestyle choices and visits with your health care provider that can promote health and wellness. What does preventive care include?  A yearly physical exam. This is also called an annual well check.  Dental exams once or twice a year.  Routine eye exams. Ask your health care provider how often you should have your eyes checked.  Personal lifestyle choices, including: ? Daily care of your teeth and gums. ? Regular physical activity. ? Eating a healthy diet. ? Avoiding tobacco and drug use. ? Limiting alcohol use. ? Practicing safe sex. ? Taking low-dose aspirin every day. ? Taking vitamin and mineral supplements as recommended by your health care provider. What happens during an annual well check? The services and screenings done by your health care provider during your annual well check will depend on your age, overall health, lifestyle risk factors, and family history of disease. Counseling Your health care provider may ask you questions about your:  Alcohol use.  Tobacco use.  Drug use.  Emotional well-being.  Home and relationship well-being.  Sexual activity.  Eating habits.  History of falls.  Memory and ability to understand (cognition).  Work and work environment.  Reproductive health.  Screening You may have the following tests or measurements:  Height, weight, and BMI.  Blood pressure.  Lipid and cholesterol levels. These may be checked every 5 years, or more frequently if you are over 50 years old.  Skin check.  Lung cancer screening. You may have this screening every year starting at age 55 if you have a 30-pack-year history of smoking and currently smoke or have quit within the past 15 years.  Fecal occult blood test (FOBT) of the stool. You may have this test every year starting at age 50.  Flexible sigmoidoscopy or colonoscopy. You may have a sigmoidoscopy every 5 years or  a colonoscopy every 10 years starting at age 50.  Hepatitis C blood test.  Hepatitis B blood test.  Sexually transmitted disease (STD) testing.  Diabetes screening. This is done by checking your blood sugar (glucose) after you have not eaten for a while (fasting). You may have this done every 1-3 years.  Bone density scan. This is done to screen for osteoporosis. You may have this done starting at age 69.  Mammogram. This may be done every 1-2 years. Talk to your health care provider about how often you should have regular mammograms.  Talk with your health care provider about your test results, treatment options, and if necessary, the need for more tests. Vaccines Your health care provider may recommend certain vaccines, such as:  Influenza vaccine. This is recommended every year.  Tetanus, diphtheria, and acellular pertussis (Tdap, Td) vaccine. You may need a Td booster every 10 years.  Varicella vaccine. You may need this if you have not been vaccinated.  Zoster vaccine. You may need this after age 60.  Measles, mumps, and rubella (MMR) vaccine. You may need at least one dose of MMR if you were born in 1957 or later. You may also need a second dose.  Pneumococcal 13-valent conjugate (PCV13) vaccine. One dose is recommended after age 69.  Pneumococcal polysaccharide (PPSV23) vaccine. One dose is recommended after age 69.  Meningococcal vaccine. You may need this if you have certain conditions.  Hepatitis A vaccine. You may need this if you have certain conditions or if you travel or work in places where you may be exposed to hepatitis   A.  Hepatitis B vaccine. You may need this if you have certain conditions or if you travel or work in places where you may be exposed to hepatitis B.  Haemophilus influenzae type b (Hib) vaccine. You may need this if you have certain conditions.  Talk to your health care provider about which screenings and vaccines you need and how often you  need them. This information is not intended to replace advice given to you by your health care provider. Make sure you discuss any questions you have with your health care provider. Document Released: 11/29/2015 Document Revised: 07/22/2016 Document Reviewed: 09/03/2015 Elsevier Interactive Patient Education  2017 [employer].

## 2017-10-20 NOTE — Progress Notes (Signed)
Patient: Jodi Day, Female    DOB: 06-29-1946, 71 y.o.   MRN: [id number] Visit Date: 10/21/2017  Today's Provider: Shirlee Latch, MD   Chief Complaint  Patient presents with  . Annual Exam   Subjective:   I, Jodi Day, CMA, am acting as scribe for Shirlee Latch, MD.    Annual physical exam Jodi Day is a 71 y.o. female who presents today for health maintenance and complete physical. She feels well. She reports exercising 3 times a week for an hour. Works out with trainer twice a week, and takes piliates classes. She reports she is sleeping well.  Last colonoscopy 02/03/2012- polyps, internal hemorrhoids. Last mammogram- 11/27/2015- BI-RADS 2 Last BMD, 11/27/2015- normal Pt is aware that Medicare may not cover a tetanus vaccine, but still requests to update this today. -----------------------------------------------------------------  Stiffness in b/l knees Pain in b/l thumbs - intermittent for >1 yrs ?psoriatic arthritis - has h/o psoriasis Not worse in mornings, but is bad after sitting for long periods of times Stiffness only lasts for a few minutes at a time Has noticed swelling in these joints intermittently No pain today  Review of Systems  Constitutional: Negative.   HENT: Positive for tinnitus. Negative for congestion, dental problem, drooling, ear discharge, ear pain, facial swelling, hearing loss, mouth sores, nosebleeds, postnasal drip, rhinorrhea, sinus pressure, sinus pain, sneezing, sore throat, trouble swallowing and voice change.   Eyes: Negative.   Respiratory: Positive for shortness of breath. Negative for apnea, cough, choking, chest tightness, wheezing and stridor.   Cardiovascular: Negative.   Gastrointestinal: Positive for abdominal distention and diarrhea. Negative for abdominal pain, anal bleeding, blood in stool, constipation, nausea, rectal pain and vomiting.  Endocrine: Negative.   Genitourinary: Negative.     Musculoskeletal: Positive for arthralgias. Negative for back pain, gait problem, joint swelling, myalgias, neck pain and neck stiffness.  Skin: Negative.   Allergic/Immunologic: Negative.   Neurological: Negative.   Hematological: Negative.   Psychiatric/Behavioral: Negative for agitation, behavioral problems, confusion, decreased concentration, dysphoric mood, hallucinations, self-injury, sleep disturbance and suicidal ideas. The patient is nervous/anxious. The patient is not hyperactive.     Social History      She  reports that she quit smoking about 39 years ago. She has a 7.00 pack-year smoking history. she has never used smokeless tobacco. She reports that she drinks alcohol. She reports that she does not use drugs.       Social History   Socioeconomic History  . Marital status: Married    Spouse name: Thereasa Distance  . Number of children: 2  . Years of education: Associates  . Highest education level: Not on file  Social Needs  . Financial resource strain: Not on file  . Food insecurity - worry: Never true  . Food insecurity - inability: Never true  . Transportation needs - medical: No  . Transportation needs - non-medical: No  Occupational History  . Occupation: Self Employeed owns a Research officer, political party business  Tobacco Use  . Smoking status: Former Smoker    Packs/day: 1.00    Years: 7.00    Pack years: 7.00    Last attempt to quit: 11/15/1977    Years since quitting: 39.9  . Smokeless tobacco: Never Used  Substance and Sexual Activity  . Alcohol use: Yes    Comment: 1 glass of wine monthly  . Drug use: No  . Sexual activity: Yes  Other Topics Concern  . Not on file  Social History Narrative  . Not on file    Past Medical History:  Diagnosis Date  . Anxiety   . Thyroid disease      Patient Active Problem List   Diagnosis Date Noted  . Polyarthralgia 10/20/2017  . Joint stiffness 10/20/2017  . Grief counseling 01/15/2016  . Anxiety 07/15/2015  . Psoriasis  07/15/2015  . Blood glucose elevated 07/15/2015  . Hypertriglyceridemia 07/15/2015  . Adult hypothyroidism 07/15/2015  . Hemorrhoids, internal 07/15/2015  . Contusion of knee 07/15/2015  . Symptomatic menopausal or female climacteric states 07/15/2015  . Avitaminosis D 07/15/2015  . Climacteric 07/15/2015  . Disease of skin and subcutaneous tissue 10/24/2012  . Big thyroid 07/04/2007  . Urge incontinence 07/04/2007  . Goiter, non-toxic 07/04/2007    Past Surgical History:  Procedure Laterality Date  . ABDOMINAL HYSTERECTOMY  1992   fibroids. ovaries intact  . CHOLECYSTECTOMY  1996  . thyroidectomy  1973  . tonsillectomy and adenoidectomy  1973    Family History        Family Status  Relation Name Status  . Mother  Deceased at age 69  . Father  Deceased at age 69       COPD, tobacco abuse  . Brother  Deceased at age 69       pneumonia  . Brother  Alive  . Brother  Alive        Her family history includes COPD in her father; Colon polyps in her brother; Diabetes in her mother; Hypothyroidism in her mother; Parkinson's disease in her brother; Pneumonia in her brother; Prostate cancer in her brother and brother; Stroke in her mother.     Allergies  Allergen Reactions  . Penicillins     per pt's mother.     Current Outpatient Medications:  .  Calcium Carbonate-Vit D-Min (CALCIUM 1200) 1200-1000 MG-UNIT CHEW, Chew 1 tablet by mouth daily., Disp: , Rfl:  .  liothyronine (CYTOMEL) 5 MCG tablet, Take 5 mcg by mouth daily., Disp: , Rfl:  .  Menthol, Topical Analgesic, (BIOFREEZE EX), Apply as needed topically., Disp: , Rfl:  .  metFORMIN (GLUCOPHAGE) 500 MG tablet, Take 1 tablet by mouth 2 (two) times daily. Reported on 04/16/2016, Disp: , Rfl:  .  progesterone (PROMETRIUM) 100 MG capsule, Take 100 mg daily by mouth. , Disp: , Rfl:  .  SYNTHROID 137 MCG tablet, , Disp: , Rfl: 10 .  Vitamin D, Cholecalciferol, 1000 UNITS CAPS, Take 1,000 Units daily by mouth. , Disp: , Rfl:  .   vitamin E 400 UNIT capsule, Take 1 capsule by mouth daily., Disp: , Rfl:  .  VITAMIN K PO, Take daily by mouth., Disp: , Rfl:    Patient Care Team: Erasmo DownerBacigalupo, Ishan Sanroman M, MD as PCP - General (Family Medicine) Mia CreekVaughan, Elizabeth, MD as Referring Physician (Internal Medicine) Tyrone SchimkeStonecipher, Karl, MD as Consulting Physician (Ophthalmology)      Objective:   Vitals: BP 122/66 (BP Location: Right Arm, Patient Position: Sitting, Cuff Size: Normal)   Pulse 67   Temp 98.3 F (36.8 C) (Oral)   Resp 16   Ht 5\' 10"  (1.778 m)   Wt 179 lb (81.2 kg)   SpO2 97%   BMI 25.68 kg/m    Vitals:   10/20/17 1517  BP: 122/66  Pulse: 67  Resp: 16  Temp: 98.3 F (36.8 C)  TempSrc: Oral  SpO2: 97%  Weight: 179 lb (81.2 kg)  Height: 5\' 10"  (1.778 m)     Physical  Exam  Constitutional: She is oriented to person, place, and time. She appears well-developed and well-nourished. No distress.  HENT:  Head: Normocephalic and atraumatic.  Right Ear: External ear normal.  Left Ear: External ear normal.  Nose: Nose normal.  Mouth/Throat: Oropharynx is clear and moist. No oropharyngeal exudate.  Eyes: Conjunctivae are normal. Pupils are equal, round, and reactive to light. No scleral icterus.  Neck: Neck supple. No thyromegaly present.  Cardiovascular: Normal rate, regular rhythm, normal heart sounds and intact distal pulses.  No murmur heard. Pulmonary/Chest: Effort normal and breath sounds normal. No respiratory distress. She has no wheezes. She has no rales.  Breasts: breasts appear normal, no suspicious masses, no skin or nipple changes or axillary nodes.  Abdominal: Soft. Bowel sounds are normal. She exhibits no distension. There is no tenderness. There is no rebound and no guarding.  Musculoskeletal: She exhibits no edema.  B/l knees: no effusion, ROM intact, strength intact, no joint line tenderness B/l wrists: swelling of b/l bases of thumbs with somewhat limited ROM and slight TTP    Lymphadenopathy:    She has no cervical adenopathy.  Neurological: She is alert and oriented to person, place, and time.  Skin: Skin is warm and dry. No rash noted.  Psychiatric: She has a normal mood and affect. Her behavior is normal.  Vitals reviewed.    Depression Screen PHQ 2/9 Scores 09/22/2017 09/22/2017 04/16/2016  PHQ - 2 Score 0 0 0  PHQ- 9 Score 2 - -      Assessment & Plan:     Routine Health Maintenance and Physical Exam  Exercise Activities and Dietary recommendations Goals    None      Immunization History  Administered Date(s) Administered  . Pneumococcal Conjugate-13 01/08/2015  . Pneumococcal Polysaccharide-23 01/15/2016  . Td 10/20/2017  . Tdap 07/01/2007  . Zoster 01/04/2009    Health Maintenance  Topic Date Due  . MAMMOGRAM  11/26/2017  . COLONOSCOPY  02/02/2022  . TETANUS/TDAP  10/21/2027  . INFLUENZA VACCINE  Completed  . DEXA SCAN  Completed  . Hepatitis C Screening  Completed  . PNA vac Low Risk Adult  Completed    - will put on waitlist for Shingrix Discussed health benefits of physical activity, and encouraged her to engage in regular exercise appropriate for her age and condition.    -------------------------------------------------------------------- Problem List Items Addressed This Visit      Endocrine   Adult hypothyroidism    Followed by ENT that removed thyroid He is checking TSH regularly and adjusting Synthroid dose        Musculoskeletal and Integument   Psoriasis    Followed by Vaughan Sine and managed with topical steroids prn Given h/o psoriasis, somewhat concerning that polyarthralgias could be related to psoriatic arthritis - see below      Relevant Orders   Ambulatory referral to Rheumatology     Other   Blood glucose elevated    Recheck A1c Not currently on medications      Hypertriglyceridemia    Recheck lipid panel, not on statin      Polyarthralgia    4 different joints with intermittent stiffness,  swelling, and pain Can see swellign of b/l thumbs today Given h/o psoriasis, could be related to psoriatic arthritis, though most likely OA Referral to rheum for further eval Will defer imaging and labs to rheum      Relevant Orders   Ambulatory referral to Rheumatology   Joint stiffness    See  plan for polyarthralgia above      Relevant Orders   Ambulatory referral to Rheumatology    Other Visit Diagnoses    Encounter for annual physical exam    -  Primary   Relevant Orders   Lipid panel   CBC   Comprehensive metabolic panel   Hemoglobin A1c   Screening for colon cancer       Relevant Orders   Ambulatory referral to Gastroenterology   Screening for breast cancer       Relevant Orders   MM Digital Screening   Estrogen deficiency       Relevant Orders   DG Bone Density   Need for Td vaccine       Relevant Orders   Td vaccine greater than or equal to 7yo preservative free IM (Completed)      Return in about 1 year (around 10/20/2018).  The entirety of the information documented in the History of Present Illness, Review of Systems and Physical Exam were personally obtained by me. Portions of this information were initially documented by Irving BurtonEmily Day, CMA and reviewed by me for thoroughness and accuracy.    Erasmo DownerBacigalupo, Alyona Romack M, MD, MPH [facility] 10/21/2017 5:01 PM

## 2017-10-21 NOTE — Assessment & Plan Note (Signed)
4 different joints with intermittent stiffness, swelling, and pain Can see swellign of b/l thumbs today Given h/o psoriasis, could be related to psoriatic arthritis, though most likely OA Referral to rheum for further eval Will defer imaging and labs to rheum

## 2017-10-21 NOTE — Assessment & Plan Note (Signed)
Followed by Jodi Day and managed with topical steroids prn Given h/o psoriasis, somewhat concerning that polyarthralgias could be related to psoriatic arthritis - see below

## 2017-10-21 NOTE — Assessment & Plan Note (Signed)
Recheck A1c Not currently on medications

## 2017-10-21 NOTE — Assessment & Plan Note (Signed)
Recheck lipid panel, not on statin

## 2017-10-21 NOTE — Assessment & Plan Note (Signed)
Followed by ENT that removed thyroid He is checking TSH regularly and adjusting Synthroid dose

## 2017-10-21 NOTE — Assessment & Plan Note (Signed)
See plan for polyarthralgia above 

## 2017-11-17 DIAGNOSIS — M2550 Pain in unspecified joint: Secondary | ICD-10-CM | POA: Diagnosis not present

## 2017-11-17 DIAGNOSIS — L409 Psoriasis, unspecified: Secondary | ICD-10-CM | POA: Diagnosis not present

## 2017-12-01 DIAGNOSIS — Z1231 Encounter for screening mammogram for malignant neoplasm of breast: Secondary | ICD-10-CM | POA: Diagnosis not present

## 2017-12-01 DIAGNOSIS — Z780 Asymptomatic menopausal state: Secondary | ICD-10-CM | POA: Diagnosis not present

## 2017-12-01 LAB — HM MAMMOGRAPHY

## 2017-12-01 LAB — HM DEXA SCAN: HM Dexa Scan: NORMAL

## 2017-12-02 ENCOUNTER — Telehealth: Payer: Self-pay

## 2017-12-02 NOTE — Telephone Encounter (Signed)
Left message advising pt BMD and mammogram was WNL. OK per DPR. See scanned documents.

## 2017-12-03 DIAGNOSIS — Z8371 Family history of colonic polyps: Secondary | ICD-10-CM | POA: Diagnosis not present

## 2017-12-03 DIAGNOSIS — Z1211 Encounter for screening for malignant neoplasm of colon: Secondary | ICD-10-CM | POA: Diagnosis not present

## 2017-12-17 ENCOUNTER — Ambulatory Visit: Payer: Medicare Other | Admitting: Family Medicine

## 2017-12-17 ENCOUNTER — Ambulatory Visit: Payer: Medicare Other | Admitting: Physician Assistant

## 2017-12-27 ENCOUNTER — Ambulatory Visit (INDEPENDENT_AMBULATORY_CARE_PROVIDER_SITE_OTHER): Payer: Medicare Other | Admitting: Family Medicine

## 2017-12-27 ENCOUNTER — Ambulatory Visit: Payer: Medicare Other | Admitting: Family Medicine

## 2017-12-27 DIAGNOSIS — Z23 Encounter for immunization: Secondary | ICD-10-CM | POA: Diagnosis not present

## 2017-12-27 NOTE — Progress Notes (Signed)
Patient seen for Shingrix Vaccine only.  History and allergies reviewed.  Did not see/examine the patient  Erasmo DownerBacigalupo, Angela M, MD, MPH [facility] 12/27/2017 9:11 AM

## 2018-01-04 DIAGNOSIS — K635 Polyp of colon: Secondary | ICD-10-CM | POA: Diagnosis not present

## 2018-01-04 DIAGNOSIS — D126 Benign neoplasm of colon, unspecified: Secondary | ICD-10-CM | POA: Diagnosis not present

## 2018-01-04 DIAGNOSIS — Z8371 Family history of colonic polyps: Secondary | ICD-10-CM | POA: Diagnosis not present

## 2018-01-04 DIAGNOSIS — K648 Other hemorrhoids: Secondary | ICD-10-CM | POA: Diagnosis not present

## 2018-01-04 DIAGNOSIS — Z1211 Encounter for screening for malignant neoplasm of colon: Secondary | ICD-10-CM | POA: Diagnosis not present

## 2018-01-04 DIAGNOSIS — D125 Benign neoplasm of sigmoid colon: Secondary | ICD-10-CM | POA: Diagnosis not present

## 2018-01-04 DIAGNOSIS — D120 Benign neoplasm of cecum: Secondary | ICD-10-CM | POA: Diagnosis not present

## 2018-01-06 DIAGNOSIS — R6882 Decreased libido: Secondary | ICD-10-CM | POA: Diagnosis not present

## 2018-01-06 DIAGNOSIS — R5383 Other fatigue: Secondary | ICD-10-CM | POA: Diagnosis not present

## 2018-01-06 DIAGNOSIS — M2550 Pain in unspecified joint: Secondary | ICD-10-CM | POA: Diagnosis not present

## 2018-01-12 DIAGNOSIS — R5383 Other fatigue: Secondary | ICD-10-CM | POA: Diagnosis not present

## 2018-01-12 DIAGNOSIS — R6882 Decreased libido: Secondary | ICD-10-CM | POA: Diagnosis not present

## 2018-01-12 DIAGNOSIS — M2550 Pain in unspecified joint: Secondary | ICD-10-CM | POA: Diagnosis not present

## 2018-01-12 DIAGNOSIS — E039 Hypothyroidism, unspecified: Secondary | ICD-10-CM | POA: Diagnosis not present

## 2018-02-28 ENCOUNTER — Ambulatory Visit: Payer: Medicare Other | Admitting: Family Medicine

## 2018-02-28 DIAGNOSIS — Z23 Encounter for immunization: Secondary | ICD-10-CM | POA: Diagnosis not present

## 2018-02-28 NOTE — Progress Notes (Signed)
Patient presents for 2nd Shingrix vaccine only.  Vaccine consent form and problem list reviewed. I did not see the patient personally.  Erasmo DownerBacigalupo, Jammy Stlouis M, MD, MPH [facility] 02/28/2018 3:32 PM

## 2018-03-10 ENCOUNTER — Telehealth: Payer: Self-pay

## 2018-03-10 ENCOUNTER — Other Ambulatory Visit: Payer: Self-pay | Admitting: Family Medicine

## 2018-03-10 ENCOUNTER — Other Ambulatory Visit: Payer: Self-pay

## 2018-03-10 DIAGNOSIS — R739 Hyperglycemia, unspecified: Secondary | ICD-10-CM

## 2018-03-10 DIAGNOSIS — E781 Pure hyperglyceridemia: Secondary | ICD-10-CM | POA: Diagnosis not present

## 2018-03-10 DIAGNOSIS — E039 Hypothyroidism, unspecified: Secondary | ICD-10-CM

## 2018-03-10 NOTE — Telephone Encounter (Signed)
Pt needs labs reordered from December.

## 2018-03-11 ENCOUNTER — Telehealth: Payer: Self-pay

## 2018-03-11 LAB — CBC WITH DIFFERENTIAL/PLATELET
Basophils Absolute: 0 10*3/uL (ref 0.0–0.2)
Basos: 1 %
EOS (ABSOLUTE): 0.2 10*3/uL (ref 0.0–0.4)
Eos: 4 %
HEMATOCRIT: 41.1 % (ref 34.0–46.6)
Hemoglobin: 13.5 g/dL (ref 11.1–15.9)
IMMATURE GRANULOCYTES: 0 %
Immature Grans (Abs): 0 10*3/uL (ref 0.0–0.1)
LYMPHS ABS: 1.3 10*3/uL (ref 0.7–3.1)
Lymphs: 20 %
MCH: 29 pg (ref 26.6–33.0)
MCHC: 32.8 g/dL (ref 31.5–35.7)
MCV: 88 fL (ref 79–97)
MONOS ABS: 0.5 10*3/uL (ref 0.1–0.9)
Monocytes: 8 %
NEUTROS PCT: 67 %
Neutrophils Absolute: 4.4 10*3/uL (ref 1.4–7.0)
Platelets: 320 10*3/uL (ref 150–379)
RBC: 4.66 x10E6/uL (ref 3.77–5.28)
RDW: 13.8 % (ref 12.3–15.4)
WBC: 6.6 10*3/uL (ref 3.4–10.8)

## 2018-03-11 LAB — T3, FREE: T3 FREE: 3.7 pg/mL (ref 2.0–4.4)

## 2018-03-11 LAB — LIPID PANEL
CHOL/HDL RATIO: 3.2 ratio (ref 0.0–4.4)
Cholesterol, Total: 158 mg/dL (ref 100–199)
HDL: 49 mg/dL (ref >39)
LDL Calculated: 77 mg/dL (ref 0–99)
Triglycerides: 161 mg/dL — ABNORMAL HIGH (ref 0–149)
VLDL CHOLESTEROL CAL: 32 mg/dL (ref 5–40)

## 2018-03-11 LAB — COMPREHENSIVE METABOLIC PANEL
ALK PHOS: 63 IU/L (ref 39–117)
ALT: 18 IU/L (ref 0–32)
AST: 17 IU/L (ref 0–40)
Albumin/Globulin Ratio: 1.7 (ref 1.2–2.2)
Albumin: 4 g/dL (ref 3.6–4.8)
BUN/Creatinine Ratio: 15 (ref 12–28)
BUN: 10 mg/dL (ref 8–27)
Bilirubin Total: 0.4 mg/dL (ref 0.0–1.2)
CALCIUM: 9.1 mg/dL (ref 8.7–10.3)
CO2: 22 mmol/L (ref 20–29)
CREATININE: 0.65 mg/dL (ref 0.57–1.00)
Chloride: 106 mmol/L (ref 96–106)
GFR calc Af Amer: 105 mL/min/{1.73_m2} (ref >59)
GFR calc non Af Amer: 91 mL/min/{1.73_m2} (ref >59)
GLOBULIN, TOTAL: 2.4 g/dL (ref 1.5–4.5)
Glucose: 104 mg/dL — ABNORMAL HIGH (ref 65–99)
POTASSIUM: 4.6 mmol/L (ref 3.5–5.2)
SODIUM: 142 mmol/L (ref 134–144)
Total Protein: 6.4 g/dL (ref 6.0–8.5)

## 2018-03-11 LAB — TSH+FREE T4
Free T4: 1.45 ng/dL (ref 0.82–1.77)
TSH: 0.012 u[IU]/mL — ABNORMAL LOW (ref 0.450–4.500)

## 2018-03-11 LAB — HEMOGLOBIN A1C
ESTIMATED AVERAGE GLUCOSE: 108 mg/dL
HEMOGLOBIN A1C: 5.4 % (ref 4.8–5.6)

## 2018-03-11 NOTE — Telephone Encounter (Signed)
-----   Message from Erasmo DownerAngela M Bacigalupo, MD sent at 03/11/2018  8:30 AM EDT ----- Please share these lab results with your endocrinologist.  We drew them so you would not need to be stuck twice.  Your thyroid hormones are normal, but thyroid stimulating hormone is slightly low.  They may want to lower Synthroid dose slightly.  Erasmo DownerBacigalupo, Angela M, MD, MPH [facility] 03/11/2018 8:30 AM

## 2018-03-11 NOTE — Telephone Encounter (Signed)
Viewed by Sherian Maroonommie J Pittsley on 03/11/2018 10:43 AM

## 2018-03-11 NOTE — Telephone Encounter (Signed)
Viewed by Hoang J Gorton on 03/11/2018 10:43 AM   

## 2018-03-11 NOTE — Telephone Encounter (Signed)
-----   Message from Erasmo DownerAngela M Bacigalupo, MD sent at 03/11/2018  8:28 AM EDT ----- Normal cholesterol, kidney function, liver function, electrolytes, blood counts.  Hemoglobin A1c is normal, but fasting blood sugar and triglycerides are elevated which shows signs of insulin resistance.  This could lead to prediabetes or diabetes down the line.  Recommend low carb diet and regular exercise.  See other result note ZO:[id number]:thyroid.  Erasmo DownerBacigalupo, Angela M, MD, MPH [facility] 03/11/2018 8:28 AM

## 2018-03-21 DIAGNOSIS — E890 Postprocedural hypothyroidism: Secondary | ICD-10-CM | POA: Diagnosis not present

## 2018-04-06 DIAGNOSIS — N951 Menopausal and female climacteric states: Secondary | ICD-10-CM | POA: Diagnosis not present

## 2018-04-06 DIAGNOSIS — R6882 Decreased libido: Secondary | ICD-10-CM | POA: Diagnosis not present

## 2018-04-06 DIAGNOSIS — M2550 Pain in unspecified joint: Secondary | ICD-10-CM | POA: Diagnosis not present

## 2018-04-06 DIAGNOSIS — E039 Hypothyroidism, unspecified: Secondary | ICD-10-CM | POA: Diagnosis not present

## 2018-04-13 DIAGNOSIS — N951 Menopausal and female climacteric states: Secondary | ICD-10-CM | POA: Diagnosis not present

## 2018-04-13 DIAGNOSIS — E039 Hypothyroidism, unspecified: Secondary | ICD-10-CM | POA: Diagnosis not present

## 2018-04-13 DIAGNOSIS — R6882 Decreased libido: Secondary | ICD-10-CM | POA: Diagnosis not present

## 2018-06-09 DIAGNOSIS — M1711 Unilateral primary osteoarthritis, right knee: Secondary | ICD-10-CM | POA: Diagnosis not present

## 2018-06-09 DIAGNOSIS — M25561 Pain in right knee: Secondary | ICD-10-CM | POA: Diagnosis not present

## 2018-06-09 DIAGNOSIS — M7061 Trochanteric bursitis, right hip: Secondary | ICD-10-CM | POA: Diagnosis not present

## 2018-06-23 DIAGNOSIS — E890 Postprocedural hypothyroidism: Secondary | ICD-10-CM | POA: Diagnosis not present

## 2018-06-28 DIAGNOSIS — E890 Postprocedural hypothyroidism: Secondary | ICD-10-CM | POA: Diagnosis not present

## 2018-06-30 DIAGNOSIS — M1711 Unilateral primary osteoarthritis, right knee: Secondary | ICD-10-CM | POA: Diagnosis not present

## 2018-07-07 DIAGNOSIS — M1711 Unilateral primary osteoarthritis, right knee: Secondary | ICD-10-CM | POA: Diagnosis not present

## 2018-07-14 DIAGNOSIS — M1711 Unilateral primary osteoarthritis, right knee: Secondary | ICD-10-CM | POA: Diagnosis not present

## 2018-07-25 DIAGNOSIS — R6882 Decreased libido: Secondary | ICD-10-CM | POA: Diagnosis not present

## 2018-07-25 DIAGNOSIS — E039 Hypothyroidism, unspecified: Secondary | ICD-10-CM | POA: Diagnosis not present

## 2018-07-25 DIAGNOSIS — N951 Menopausal and female climacteric states: Secondary | ICD-10-CM | POA: Diagnosis not present

## 2018-07-27 DIAGNOSIS — N951 Menopausal and female climacteric states: Secondary | ICD-10-CM | POA: Diagnosis not present

## 2018-07-27 DIAGNOSIS — R6882 Decreased libido: Secondary | ICD-10-CM | POA: Diagnosis not present

## 2018-07-27 DIAGNOSIS — R5383 Other fatigue: Secondary | ICD-10-CM | POA: Diagnosis not present

## 2018-08-15 DIAGNOSIS — Z23 Encounter for immunization: Secondary | ICD-10-CM | POA: Diagnosis not present

## 2018-09-06 DIAGNOSIS — M1711 Unilateral primary osteoarthritis, right knee: Secondary | ICD-10-CM | POA: Diagnosis not present

## 2018-09-11 DIAGNOSIS — M1711 Unilateral primary osteoarthritis, right knee: Secondary | ICD-10-CM | POA: Insufficient documentation

## 2018-09-19 DIAGNOSIS — E890 Postprocedural hypothyroidism: Secondary | ICD-10-CM | POA: Diagnosis not present

## 2018-09-23 ENCOUNTER — Ambulatory Visit (INDEPENDENT_AMBULATORY_CARE_PROVIDER_SITE_OTHER): Payer: Medicare Other

## 2018-09-23 VITALS — BP 120/72 | HR 68 | Temp 98.4°F | Ht 70.0 in | Wt 192.2 lb

## 2018-09-23 DIAGNOSIS — Z0000 Encounter for general adult medical examination without abnormal findings: Secondary | ICD-10-CM | POA: Diagnosis not present

## 2018-09-23 NOTE — Patient Instructions (Addendum)
Jodi Day , Thank you for taking time to come for your Medicare Wellness Visit. I appreciate your ongoing commitment to your health goals. Please review the following plan we discussed and let me know if I can assist you in the future.   Screening recommendations/referrals: Colonoscopy: Up to date, repeat 12/2022 Mammogram: Up to date, due 11/2019 Bone Density: Up to date, due 11/2027 Recommended yearly ophthalmology/optometry visit for glaucoma screening and checkup Recommended yearly dental visit for hygiene and checkup  Vaccinations: Influenza vaccine: Up to date Pneumococcal vaccine: Completed series Tdap vaccine: Up to date, due 10/2027 Shingles vaccine: Completed series    Advanced directives: Advance directive discussed with you today. Even though you declined this today please call our office should you change your mind and we can give you the proper paperwork for you to fill out.  Conditions/risks identified: Continue   Next appointment: 01/05/19 @ 9 AM with Dr Beryle Flock.   Preventive Care 42 Years and Older, Female Preventive care refers to lifestyle choices and visits with your health care provider that can promote health and wellness. What does preventive care include?  A yearly physical exam. This is also called an annual well check.  Dental exams once or twice a year.  Routine eye exams. Ask your health care provider how often you should have your eyes checked.  Personal lifestyle choices, including:  Daily care of your teeth and gums.  Regular physical activity.  Eating a healthy diet.  Avoiding tobacco and drug use.  Limiting alcohol use.  Practicing safe sex.  Taking low-dose aspirin every day.  Taking vitamin and mineral supplements as recommended by your health care provider. What happens during an annual well check? The services and screenings done by your health care provider during your annual well check will depend on your age, overall  health, lifestyle risk factors, and family history of disease. Counseling  Your health care provider may ask you questions about your:  Alcohol use.  Tobacco use.  Drug use.  Emotional well-being.  Home and relationship well-being.  Sexual activity.  Eating habits.  History of falls.  Memory and ability to understand (cognition).  Work and work Astronomer.  Reproductive health. Screening  You may have the following tests or measurements:  Height, weight, and BMI.  Blood pressure.  Lipid and cholesterol levels. These may be checked every 5 years, or more frequently if you are over 67 years old.  Skin check.  Lung cancer screening. You may have this screening every year starting at age 70 if you have a 30-pack-year history of smoking and currently smoke or have quit within the past 15 years.  Fecal occult blood test (FOBT) of the stool. You may have this test every year starting at age 52.  Flexible sigmoidoscopy or colonoscopy. You may have a sigmoidoscopy every 5 years or a colonoscopy every 10 years starting at age 58.  Hepatitis C blood test.  Hepatitis B blood test.  Sexually transmitted disease (STD) testing.  Diabetes screening. This is done by checking your blood sugar (glucose) after you have not eaten for a while (fasting). You may have this done every 1-3 years.  Bone density scan. This is done to screen for osteoporosis. You may have this done starting at age 16.  Mammogram. This may be done every 1-2 years. Talk to your health care provider about how often you should have regular mammograms. Talk with your health care provider about your test results, treatment options, and if necessary,  the need for more tests. Vaccines  Your health care provider may recommend certain vaccines, such as:  Influenza vaccine. This is recommended every year.  Tetanus, diphtheria, and acellular pertussis (Tdap, Td) vaccine. You may need a Td booster every 10  years.  Zoster vaccine. You may need this after age 66.  Pneumococcal 13-valent conjugate (PCV13) vaccine. One dose is recommended after age 8.  Pneumococcal polysaccharide (PPSV23) vaccine. One dose is recommended after age 75. Talk to your health care provider about which screenings and vaccines you need and how often you need them. This information is not intended to replace advice given to you by your health care provider. Make sure you discuss any questions you have with your health care provider. Document Released: 11/29/2015 Document Revised: 07/22/2016 Document Reviewed: 09/03/2015 Elsevier Interactive Patient Education  2017 [place] Prevention in the Home Falls can cause injuries. They can happen to people of all ages. There are many things you can do to make your home safe and to help prevent falls. What can I do on the outside of my home?  Regularly fix the edges of walkways and driveways and fix any cracks.  Remove anything that might make you trip as you walk through a door, such as a raised step or threshold.  Trim any bushes or trees on the path to your home.  Use bright outdoor lighting.  Clear any walking paths of anything that might make someone trip, such as rocks or tools.  Regularly check to see if handrails are loose or broken. Make sure that both sides of any steps have handrails.  Any raised decks and porches should have guardrails on the edges.  Have any leaves, snow, or ice cleared regularly.  Use sand or salt on walking paths during winter.  Clean up any spills in your garage right away. This includes oil or grease spills. What can I do in the bathroom?  Use night lights.  Install grab bars by the toilet and in the tub and shower. Do not use towel bars as grab bars.  Use non-skid mats or decals in the tub or shower.  If you need to sit down in the shower, use a plastic, non-slip stool.  Keep the floor dry. Clean up any water that  spills on the floor as soon as it happens.  Remove soap buildup in the tub or shower regularly.  Attach bath mats securely with double-sided non-slip rug tape.  Do not have throw rugs and other things on the floor that can make you trip. What can I do in the bedroom?  Use night lights.  Make sure that you have a light by your bed that is easy to reach.  Do not use any sheets or blankets that are too big for your bed. They should not hang down onto the floor.  Have a firm chair that has side arms. You can use this for support while you get dressed.  Do not have throw rugs and other things on the floor that can make you trip. What can I do in the kitchen?  Clean up any spills right away.  Avoid walking on wet floors.  Keep items that you use a lot in easy-to-reach places.  If you need to reach something above you, use a strong step stool that has a grab bar.  Keep electrical cords out of the way.  Do not use floor polish or wax that makes floors slippery. If you must use  wax, use non-skid floor wax.  Do not have throw rugs and other things on the floor that can make you trip. What can I do with my stairs?  Do not leave any items on the stairs.  Make sure that there are handrails on both sides of the stairs and use them. Fix handrails that are broken or loose. Make sure that handrails are as long as the stairways.  Check any carpeting to make sure that it is firmly attached to the stairs. Fix any carpet that is loose or worn.  Avoid having throw rugs at the top or bottom of the stairs. If you do have throw rugs, attach them to the floor with carpet tape.  Make sure that you have a light switch at the top of the stairs and the bottom of the stairs. If you do not have them, ask someone to add them for you. What else can I do to help prevent falls?  Wear shoes that:  Do not have high heels.  Have rubber bottoms.  Are comfortable and fit you well.  Are closed at the  toe. Do not wear sandals.  If you use a stepladder:  Make sure that it is fully opened. Do not climb a closed stepladder.  Make sure that both sides of the stepladder are locked into place.  Ask someone to hold it for you, if possible.  Clearly mark and make sure that you can see:  Any grab bars or handrails.  First and last steps.  Where the edge of each step is.  Use tools that help you move around (mobility aids) if they are needed. These include:  Canes.  Walkers.  Scooters.  Crutches.  Turn on the lights when you go into a dark area. Replace any light bulbs as soon as they burn out.  Set up your furniture so you have a clear path. Avoid moving your furniture around.  If any of your floors are uneven, fix them.  If there are any pets around you, be aware of where they are.  Review your medicines with your doctor. Some medicines can make you feel dizzy. This can increase your chance of falling. Ask your doctor what other things that you can do to help prevent falls. This information is not intended to replace advice given to you by your health care provider. Make sure you discuss any questions you have with your health care provider. Document Released: 08/29/2009 Document Revised: 04/09/2016 Document Reviewed: 12/07/2014 Elsevier Interactive Patient Education  2017 [employer].

## 2018-09-23 NOTE — Progress Notes (Signed)
Subjective:   Jodi Day is a 72 y.o. female who presents for Medicare Annual (Subsequent) preventive examination.  Review of Systems:  N/A  Cardiac Risk Factors include: advanced age (>38men, >43 women)     Objective:     Vitals: BP 120/72 (BP Location: Right Arm)   Pulse 68   Temp 98.4 F (36.9 C) (Oral)   Ht 5\' 10"  (1.778 m)   Wt 192 lb 3.2 oz (87.2 kg)   BMI 27.58 kg/m   Body mass index is 27.58 kg/m.  Advanced Directives 09/23/2018 09/22/2017 04/16/2016  Does Patient Have a Medical Advance Directive? No No No  Does patient want to make changes to medical advance directive? - No - Patient declined -  Would patient like information on creating a medical advance directive? No - Patient declined - -    Tobacco Social History   Tobacco Use  Smoking Status Former Smoker  . Packs/day: 1.00  . Years: 7.00  . Pack years: 7.00  . Last attempt to quit: 11/15/1977  . Years since quitting: 40.8  Smokeless Tobacco Never Used     Counseling given: Not Answered   Clinical Intake:  Pre-visit preparation completed: Yes  Pain : No/denies pain Pain Score: 0-No pain     Nutritional Status: BMI 25 -29 Overweight Nutritional Risks: None Diabetes: No  How often do you need to have someone help you when you read instructions, pamphlets, or other written materials from your doctor or pharmacy?: 1 - Never  Interpreter Needed?: No  Information entered by :: [facility], LPN  Past Medical History:  Diagnosis Date  . Anxiety   . Thyroid disease    Past Surgical History:  Procedure Laterality Date  . ABDOMINAL HYSTERECTOMY  1992   fibroids. ovaries intact  . CHOLECYSTECTOMY  1996  . thyroidectomy  1973  . tonsillectomy and adenoidectomy  1973   Family History  Problem Relation Age of Onset  . Diabetes Mother   . Hypothyroidism Mother   . Stroke Mother   . COPD Father   . Prostate cancer Brother   . Pneumonia Brother        spesis from pneumonia  . Prostate  cancer Brother   . Colon polyps Brother   . Parkinson's disease Brother    Social History   Socioeconomic History  . Marital status: Married    Spouse name: Thereasa Distance  . Number of children: 2  . Years of education: Associates  . Highest education level: Associate degree: occupational, Scientist, product/process development, or vocational program  Occupational History  . Occupation: Self Employeed owns a Research officer, political party business  Social Needs  . Financial resource strain: Not hard at all  . Food insecurity:    Worry: Never true    Inability: Never true  . Transportation needs:    Medical: No    Non-medical: No  Tobacco Use  . Smoking status: Former Smoker    Packs/day: 1.00    Years: 7.00    Pack years: 7.00    Last attempt to quit: 11/15/1977    Years since quitting: 40.8  . Smokeless tobacco: Never Used  Substance and Sexual Activity  . Alcohol use: Yes    Alcohol/week: 0.0 - 1.0 standard drinks  . Drug use: No  . Sexual activity: Yes  Lifestyle  . Physical activity:    Days per week: 3 days    Minutes per session: 60 min  . Stress: Not at all  Relationships  . Social connections:  Talks on phone: Patient refused    Gets together: Patient refused    Attends religious service: Patient refused    Active member of club or organization: Patient refused    Attends meetings of clubs or organizations: Patient refused    Relationship status: Patient refused  Other Topics Concern  . Not on file  Social History Narrative  . Not on file    Outpatient Encounter Medications as of 09/23/2018  Medication Sig  . levothyroxine (SYNTHROID) 125 MCG tablet Take 125 mcg by mouth daily before breakfast.   . liothyronine (CYTOMEL) 5 MCG tablet Take 5 mcg by mouth daily.  . Menthol, Topical Analgesic, (BIOFREEZE EX) Apply as needed topically.  . metFORMIN (GLUCOPHAGE) 500 MG tablet Take 500 mg by mouth daily with breakfast. Reported on 04/16/2016  . naproxen sodium (ALEVE) 220 MG tablet Take 220 mg by mouth 2 (two)  times daily as needed.  . progesterone (PROMETRIUM) 100 MG capsule Take 100 mg daily by mouth.   . TURMERIC CURCUMIN PO Take 750 mg by mouth.  . Vitamin D, Cholecalciferol, 1000 UNITS CAPS Take 1,000 Units by mouth.   . vitamin E 400 UNIT capsule Take 1 capsule by mouth daily.  Marland Kitchen VITAMIN K PO Take daily by mouth.  . Calcium Carbonate-Vit D-Min (CALCIUM 1200) 1200-1000 MG-UNIT CHEW Chew 1 tablet by mouth daily.  Marland Kitchen SYNTHROID 137 MCG tablet    No facility-administered encounter medications on file as of 09/23/2018.     Activities of Daily Living In your present state of health, do you have any difficulty performing the following activities: 09/23/2018  Hearing? N  Vision? N  Difficulty concentrating or making decisions? N  Walking or climbing stairs? Y  Comment Due to knee pain.   Dressing or bathing? N  Doing errands, shopping? N  Preparing Food and eating ? N  Using the Toilet? N  In the past six months, have you accidently leaked urine? Y  Comment Rarely when exercising.   Do you have problems with loss of bowel control? N  Managing your Medications? N  Managing your Finances? N  Housekeeping or managing your Housekeeping? N  Some recent data might be hidden    Patient Care Team: Erasmo Downer, MD as PCP - General (Family Medicine) Tyrone Schimke, MD as Consulting Physician (Ophthalmology) Vernie Murders, MD (Otolaryngology) Ernest Pine Illene Labrador, MD (Orthopedic Surgery)    Assessment:   This is a routine wellness examination for Symphanie.  Exercise Activities and Dietary recommendations Current Exercise Habits: Structured exercise class, Type of exercise: stretching;strength training/weights;Other - see comments(pilates 1 day a week), Time (Minutes): 60, Frequency (Times/Week): 3, Weekly Exercise (Minutes/Week): 180, Intensity: Moderate, Exercise limited by: None identified  Goals    . Increase water intake     Recommend increasing water intake to 6-8 glasses a day.          Fall Risk Fall Risk  09/23/2018 09/22/2017 04/16/2016  Falls in the past year? 0 No No   FALL RISK PREVENTION PERTAINING TO THE HOME:  Any stairs in or around the home WITH handrails? Yes  Home free of loose throw rugs in walkways, pet beds, electrical cords, etc? Yes  Adequate lighting in your home to reduce risk of falls? Yes   ASSISTIVE DEVICES UTILIZED TO PREVENT FALLS:  Life alert? No  Use of a cane, walker or w/c? No  Grab bars in the bathroom? No  Shower chair or bench in shower? Yes Elevated toilet seat or a  handicapped toilet? Yes    TIMED UP AND GO:  Was the test performed? No .     Depression Screen PHQ 2/9 Scores 09/23/2018 09/22/2017 09/22/2017 04/16/2016  PHQ - 2 Score 0 0 0 0  PHQ- 9 Score - 2 - -     Cognitive Function:      6CIT Screen 09/22/2017  What Year? 0 points  What month? 0 points  What time? 0 points  Count back from 20 0 points  Months in reverse 2 points  Repeat phrase 0 points  Total Score 2    Immunization History  Administered Date(s) Administered  . Influenza-Unspecified 08/15/2018  . Pneumococcal Conjugate-13 01/08/2015  . Pneumococcal Polysaccharide-23 01/15/2016  . Td 10/20/2017  . Tdap 07/01/2007  . Zoster 01/04/2009  . Zoster Recombinat (Shingrix) 12/27/2017, 02/28/2018    Qualifies for Shingles Vaccine? Up to date  Tdap: Up to date  Flu Vaccine: Up to date  Pneumococcal Vaccine: Up to date   Screening Tests Health Maintenance  Topic Date Due  . MAMMOGRAM  12/02/2019  . COLONOSCOPY  01/04/2023  . TETANUS/TDAP  10/21/2027  . INFLUENZA VACCINE  Completed  . DEXA SCAN  Completed  . Hepatitis C Screening  Completed  . PNA vac Low Risk Adult  Completed    Cancer Screenings:  Colorectal Screening: Completed 01/04/18. Repeat every 5 years.  Mammogram: Completed 12/01/17.   Bone Density: Completed 12/01/17. Results reflect NORMAL.  Lung Cancer Screening: (Low Dose CT Chest recommended if Age 69-80 years,  30 pack-year currently smoking OR have quit w/in 15years.) does not qualify.   Additional Screening:  Hepatitis C Screening: Up to date  Vision Screening: Recommended annual ophthalmology exams for early detection of glaucoma and other disorders of the eye.  Dental Screening: Recommended annual dental exams for proper oral hygiene  Community Resource Referral:  CRR required this visit?  No      Plan:  I have personally reviewed and addressed the Medicare Annual Wellness questionnaire and have noted the following in the patient's chart:  A. Medical and social history B. Use of alcohol, tobacco or illicit drugs  C. Current medications and supplements D. Functional ability and status E.  Nutritional status F.  Physical activity G. Advance directives H. List of other physicians I.  Hospitalizations, surgeries, and ER visits in previous 12 months J.  Vitals K. Screenings such as hearing and vision if needed, cognitive and depression L. Referrals and appointments - none  In addition, I have reviewed and discussed with patient certain preventive protocols, quality metrics, and best practice recommendations. A written personalized care plan for preventive services as well as general preventive health recommendations were provided to patient.  See attached scanned questionnaire for additional information.   Signed,  Hyacinth Meeker, LPN Nurse Health Advisor   Nurse Recommendations: None.

## 2018-09-26 ENCOUNTER — Ambulatory Visit: Payer: Medicare Other

## 2018-09-26 DIAGNOSIS — N951 Menopausal and female climacteric states: Secondary | ICD-10-CM | POA: Diagnosis not present

## 2018-09-27 NOTE — Discharge Instructions (Signed)
°  Instructions after Total Knee Replacement ° ° Jodi Day, Jr., M.D.    ° Dept. of Orthopaedics & Sports Medicine ° [facility] ° [address] ° [place], [place]  [address] ° Phone: [phone]   Fax: [phone] ° °  °DIET: °• Drink plenty of non-alcoholic fluids. °• Resume your normal diet. Include foods high in fiber. ° °ACTIVITY:  °• You may use crutches or a walker with weight-bearing as tolerated, unless instructed otherwise. °• You may be weaned off of the walker or crutches by your Physical Therapist.  °• Do NOT place pillows under the knee. Anything placed under the knee could limit your ability to straighten the knee.   °• Continue doing gentle exercises. Exercising will reduce the pain and swelling, increase motion, and prevent muscle weakness.   °• Please continue to use the TED compression stockings for 6 weeks. You may remove the stockings at night, but should reapply them in the morning. °• Do not drive or operate any equipment until instructed. ° °WOUND CARE:  °• Continue to use the PolarCare or ice packs periodically to reduce pain and swelling. °• You may bathe or shower after the staples are removed at the first office visit following surgery. ° °MEDICATIONS: °• You may resume your regular medications. °• Please take the pain medication as prescribed on the medication. °• Do not take pain medication on an empty stomach. °• You have been given a prescription for a blood thinner (Lovenox or Coumadin). Please take the medication as instructed. (NOTE: After completing a 2 week course of Lovenox, take one Enteric-coated aspirin once a day. This along with elevation will help reduce the possibility of phlebitis in your operated leg.) °• Do not drive or drink alcoholic beverages when taking pain medications. ° °CALL THE OFFICE FOR: °• Temperature above 101 degrees °• Excessive bleeding or drainage on the dressing. °• Excessive swelling, coldness, or paleness of the toes. °• Persistent  nausea and vomiting. ° °FOLLOW-UP:  °• You should have an appointment to return to the office in 10-14 days after surgery. °• Arrangements have been made for continuation of Physical Therapy (either home therapy or outpatient therapy). °  °

## 2018-09-28 DIAGNOSIS — E890 Postprocedural hypothyroidism: Secondary | ICD-10-CM | POA: Diagnosis not present

## 2018-09-28 DIAGNOSIS — H6060 Unspecified chronic otitis externa, unspecified ear: Secondary | ICD-10-CM | POA: Diagnosis not present

## 2018-09-28 DIAGNOSIS — N951 Menopausal and female climacteric states: Secondary | ICD-10-CM | POA: Diagnosis not present

## 2018-09-28 DIAGNOSIS — R6882 Decreased libido: Secondary | ICD-10-CM | POA: Diagnosis not present

## 2018-09-28 DIAGNOSIS — R5383 Other fatigue: Secondary | ICD-10-CM | POA: Diagnosis not present

## 2018-09-28 DIAGNOSIS — M2550 Pain in unspecified joint: Secondary | ICD-10-CM | POA: Diagnosis not present

## 2018-10-19 ENCOUNTER — Encounter
Admission: RE | Admit: 2018-10-19 | Discharge: 2018-10-19 | Disposition: A | Discharge status: Home / Self Care | Payer: Medicare Other | Source: Ambulatory Visit | Attending: Orthopedic Surgery | Admitting: Orthopedic Surgery

## 2018-10-19 ENCOUNTER — Other Ambulatory Visit: Payer: Self-pay

## 2018-10-19 DIAGNOSIS — Z01812 Encounter for preprocedural laboratory examination: Secondary | ICD-10-CM | POA: Insufficient documentation

## 2018-10-19 HISTORY — DX: Hypothyroidism, unspecified: E03.9

## 2018-10-19 HISTORY — DX: Type 2 diabetes mellitus without complications: E11.9

## 2018-10-19 LAB — CBC
HCT: 42.4 % (ref 36.0–46.0)
HEMOGLOBIN: 14.2 g/dL (ref 12.0–15.0)
MCH: 30 pg (ref 26.0–34.0)
MCHC: 33.5 g/dL (ref 30.0–36.0)
MCV: 89.5 fL (ref 80.0–100.0)
Platelets: 298 10*3/uL (ref 150–400)
RBC: 4.74 MIL/uL (ref 3.87–5.11)
RDW: 12.3 % (ref 11.5–15.5)
WBC: 7.4 10*3/uL (ref 4.0–10.5)
nRBC: 0 % (ref 0.0–0.2)

## 2018-10-19 LAB — COMPREHENSIVE METABOLIC PANEL
ALK PHOS: 55 U/L (ref 38–126)
ALT: 23 U/L (ref 0–44)
AST: 23 U/L (ref 15–41)
Albumin: 4.2 g/dL (ref 3.5–5.0)
Anion gap: 8 (ref 5–15)
BUN: 12 mg/dL (ref 8–23)
CALCIUM: 9.2 mg/dL (ref 8.9–10.3)
CO2: 27 mmol/L (ref 22–32)
CREATININE: 0.62 mg/dL (ref 0.44–1.00)
Chloride: 104 mmol/L (ref 98–111)
Glucose, Bld: 112 mg/dL — ABNORMAL HIGH (ref 70–99)
Potassium: 3.6 mmol/L (ref 3.5–5.1)
Sodium: 139 mmol/L (ref 135–145)
Total Bilirubin: 0.6 mg/dL (ref 0.3–1.2)
Total Protein: 7 g/dL (ref 6.5–8.1)

## 2018-10-19 LAB — SURGICAL PCR SCREEN
MRSA, PCR: NEGATIVE
Staphylococcus aureus: POSITIVE — AB

## 2018-10-19 LAB — URINALYSIS, ROUTINE W REFLEX MICROSCOPIC
BILIRUBIN URINE: NEGATIVE
Glucose, UA: NEGATIVE mg/dL
Hgb urine dipstick: NEGATIVE
Ketones, ur: NEGATIVE mg/dL
Leukocytes, UA: NEGATIVE
Nitrite: NEGATIVE
Protein, ur: NEGATIVE mg/dL
SPECIFIC GRAVITY, URINE: 1.019 (ref 1.005–1.030)
pH: 5 (ref 5.0–8.0)

## 2018-10-19 LAB — TYPE AND SCREEN
ABO/RH(D): O POS
Antibody Screen: NEGATIVE

## 2018-10-19 LAB — APTT: aPTT: 28 seconds (ref 24–36)

## 2018-10-19 LAB — PROTIME-INR
INR: 0.88
Prothrombin Time: 11.9 seconds (ref 11.4–15.2)

## 2018-10-19 LAB — SEDIMENTATION RATE: Sed Rate: 4 mm/hr (ref 0–30)

## 2018-10-19 NOTE — Patient Instructions (Signed)
Your procedure is scheduled on: Wednesday, November 02, 2018  Report to THE SECOND FLOOR OF THE MEDICAL MALL   DO NOT STOP ON THE FIRST FLOOR TO REGISTER  To find out your arrival time please call [phone] between 1PM - 3PM on Tuesday, November 01, 2018  Remember: Instructions that are not followed completely may result in serious medical risk,  up to and including death, or upon the discretion of your surgeon and anesthesiologist your  surgery may need to be rescheduled.     _X__ 1. Do not eat food after midnight the night before your procedure.                 No gum chewing or hard candies.                     ABSOLUTELY NOTHING SOLID IN YOUR MOUTH AFTER MIDNIGHT                  You may drink clear liquids up to 2 hours before you are scheduled to arrive for your surgery-                  DO not drink clear liquids within 2 hours of the start of your surgery.                  Clear Liquids include:  water, apple juice without pulp, clear carbohydrate                 drink such as Clearfast of Gatorade, Black Coffee or Tea (Do not add                 anything to coffee or tea).  __X__2.  On the morning of surgery brush your teeth with toothpaste and water,                     You may rinse your mouth with mouthwash if you wish.                            Do not swallow any toothpaste of mouthwash.     _X__ 3.  No Alcohol for 24 hours before or after surgery.   _X__ 4.  Do Not Smoke or use e-cigarettes For 24 Hours Prior to Your Surgery.                 Do not use any chewable tobacco products for at least 6 hours prior to                 surgery.  ____  5.  Bring all medications with you on the day of surgery if instructed.   _X___  6.  Notify your doctor if there is any change in your medical condition      (cold, fever, infections).     Do not wear jewelry, make-up, hairpins, clips or nail polish. Do not wear lotions, powders, or perfumes. You  may wear deodorant. Do not shave 48 hours prior to surgery. Men may shave face and neck. Do not bring valuables to the hospital.    [facility] is not responsible for any belongings or valuables.  Contacts, dentures or bridgework may not be worn into surgery. Leave your suitcase in the car. After surgery it may be brought to your room. For patients admitted to the hospital, discharge time is determined by your treatment  team.   Patients discharged the day of surgery will not be allowed to drive home.   Please read over the following fact sheets that you were given:   PREPARING FOR SURGERY                MRSA: STOP THE SPREAD   __X__ Take these medicines the morning of surgery with A SIP OF WATER:    1. CYTOMEL  2.  SYNTHROID  3.   4.  5.  6.  ____ Fleet Enema (as directed)   _X___ Use CHG Soap as directed  ____ Use inhalers on the day of surgery  __X__ Stop metformin 2 days prior to surgery. LAST DOSE ON October 30, 2018    _X___ Stop ASPIRIN AS OF 10/26/18  _X___ Stop Anti-inflammatories AS OF 10/26/18                THIS INCLUDES IBUPROFEN / MOTRIN  ADVIL / ALEVE    __X__ Stop supplements until after surgery.                  THIS INCLUDES TURMERIC / VITAMIN E / VITAMIN K / CALCIUM CARB / IODINE  ____ Bring C-Pap to the hospital.   STOP BIOFREEZE 2 DAYS PRIOR TO SURGERY  WEAR COMFORTABLE CLOTHING / SHOES TO BE AMBULATORY IN THE HOSPITAL  BEGIN A STOOL SOFTENER PRIOR TO SURGERY .  HAVE READY TO USE ONCE DISCHARGED  CONTINUE TO TAKE PROGESTERONE AS USUAL.

## 2018-10-19 NOTE — Pre-Procedure Instructions (Signed)
FAXED POSITIVE STAPH TO DR Ernest PineHOOTEN

## 2018-10-20 LAB — URINE CULTURE
CULTURE: NO GROWTH
SPECIAL REQUESTS: NORMAL

## 2018-10-20 LAB — HEMOGLOBIN A1C
Hgb A1c MFr Bld: 4.9 % (ref 4.8–5.6)
Mean Plasma Glucose: 93.93 mg/dL

## 2018-10-21 NOTE — Pre-Procedure Instructions (Signed)
Spoke with Kathlene NovemberMike in the lab to find out why CRP results not available in system. Appears to have been cancelled and he stated that it was cancelled either by P.A.T. Or by the surgeon himself. Neither of these options seemed to be acceptable so we will call patient and have her return to have blood work redrawn for the C Reactive Protein.

## 2018-10-24 ENCOUNTER — Encounter
Admission: RE | Admit: 2018-10-24 | Discharge: 2018-10-24 | Disposition: A | Discharge status: Home / Self Care | Payer: Medicare Other | Source: Ambulatory Visit | Attending: Orthopedic Surgery | Admitting: Orthopedic Surgery

## 2018-10-24 DIAGNOSIS — Z01812 Encounter for preprocedural laboratory examination: Secondary | ICD-10-CM | POA: Insufficient documentation

## 2018-10-24 LAB — C-REACTIVE PROTEIN: CRP: <0.8 mg/dL (ref <1.0)

## 2018-10-25 DIAGNOSIS — M25561 Pain in right knee: Secondary | ICD-10-CM | POA: Diagnosis not present

## 2018-10-25 DIAGNOSIS — G8929 Other chronic pain: Secondary | ICD-10-CM | POA: Diagnosis not present

## 2018-11-01 MED ORDER — CLINDAMYCIN PHOSPHATE 900 MG/50ML IV SOLN: 900.0000 mg | INTRAVENOUS | Status: DC

## 2018-11-01 MED ORDER — TRANEXAMIC ACID-NACL 1000-0.7 MG/100ML-% IV SOLN
1000.0000 mg | INTRAVENOUS | Status: DC
  Filled 2018-11-01: qty 100

## 2018-11-02 ENCOUNTER — Encounter
Admission: RE | Disposition: A | Discharge status: Home / Self Care | Payer: Self-pay | Source: Home / Self Care | Attending: Orthopedic Surgery

## 2018-11-02 ENCOUNTER — Inpatient Hospital Stay: Payer: Medicare Other | Admitting: Anesthesiology

## 2018-11-02 ENCOUNTER — Inpatient Hospital Stay
Admission: RE | Admit: 2018-11-02 | Discharge: 2018-11-04 | DRG: 470 | Disposition: A | Discharge status: Home / Self Care | Payer: Medicare Other | Attending: Orthopedic Surgery | Admitting: Orthopedic Surgery

## 2018-11-02 ENCOUNTER — Inpatient Hospital Stay: Payer: Medicare Other

## 2018-11-02 ENCOUNTER — Encounter: Payer: Self-pay | Admitting: Orthopedic Surgery

## 2018-11-02 DIAGNOSIS — Z79899 Other long term (current) drug therapy: Secondary | ICD-10-CM

## 2018-11-02 DIAGNOSIS — Z87891 Personal history of nicotine dependence: Secondary | ICD-10-CM

## 2018-11-02 DIAGNOSIS — Z96659 Presence of unspecified artificial knee joint: Secondary | ICD-10-CM

## 2018-11-02 DIAGNOSIS — E119 Type 2 diabetes mellitus without complications: Secondary | ICD-10-CM | POA: Diagnosis present

## 2018-11-02 DIAGNOSIS — E039 Hypothyroidism, unspecified: Secondary | ICD-10-CM | POA: Diagnosis present

## 2018-11-02 DIAGNOSIS — Z90710 Acquired absence of both cervix and uterus: Secondary | ICD-10-CM | POA: Diagnosis not present

## 2018-11-02 DIAGNOSIS — Z79891 Long term (current) use of opiate analgesic: Secondary | ICD-10-CM

## 2018-11-02 DIAGNOSIS — Z7984 Long term (current) use of oral hypoglycemic drugs: Secondary | ICD-10-CM

## 2018-11-02 DIAGNOSIS — Z96651 Presence of right artificial knee joint: Secondary | ICD-10-CM | POA: Diagnosis not present

## 2018-11-02 DIAGNOSIS — Z9049 Acquired absence of other specified parts of digestive tract: Secondary | ICD-10-CM | POA: Diagnosis not present

## 2018-11-02 DIAGNOSIS — Z471 Aftercare following joint replacement surgery: Secondary | ICD-10-CM | POA: Diagnosis not present

## 2018-11-02 DIAGNOSIS — F419 Anxiety disorder, unspecified: Secondary | ICD-10-CM | POA: Diagnosis present

## 2018-11-02 DIAGNOSIS — M1711 Unilateral primary osteoarthritis, right knee: Secondary | ICD-10-CM | POA: Diagnosis not present

## 2018-11-02 DIAGNOSIS — Z79890 Hormone replacement therapy (postmenopausal): Secondary | ICD-10-CM

## 2018-11-02 HISTORY — PX: KNEE ARTHROPLASTY: SHX992

## 2018-11-02 LAB — GLUCOSE, CAPILLARY
GLUCOSE-CAPILLARY: 124 mg/dL — AB (ref 70–99)
Glucose-Capillary: 93 mg/dL (ref 70–99)
Glucose-Capillary: 130 mg/dL — ABNORMAL HIGH (ref 70–99)

## 2018-11-02 LAB — ABO/RH: ABO/RH(D): O POS

## 2018-11-02 SURGERY — ARTHROPLASTY, KNEE, TOTAL, USING IMAGELESS COMPUTER-ASSISTED NAVIGATION
Anesthesia: Spinal | Site: Knee | Laterality: Right

## 2018-11-02 MED ORDER — CHLORHEXIDINE GLUCONATE 4 % EX LIQD: 60.0000 mL | Freq: Once | CUTANEOUS | Status: DC

## 2018-11-02 MED ORDER — BUPIVACAINE HCL (PF) 0.5 % IJ SOLN
INTRAMUSCULAR | Status: DC | PRN
  Administered 2018-11-02: 2 mL

## 2018-11-02 MED ORDER — TRAMADOL HCL 50 MG PO TABS
50.0000 mg | ORAL_TABLET | ORAL | Status: DC | PRN
  Administered 2018-11-03 – 2018-11-04 (×4): 100 mg via ORAL
  Filled 2018-11-02 (×4): qty 2

## 2018-11-02 MED ORDER — SODIUM CHLORIDE 0.9 % IV SOLN
INTRAVENOUS | Status: DC
  Administered 2018-11-02 – 2018-11-03 (×2): via INTRAVENOUS

## 2018-11-02 MED ORDER — ONDANSETRON HCL 4 MG PO TABS: 4.0000 mg | ORAL_TABLET | Freq: Four times a day (QID) | ORAL | Status: DC | PRN

## 2018-11-02 MED ORDER — SODIUM CHLORIDE 0.9 % IV SOLN
INTRAVENOUS | Status: DC | PRN
  Administered 2018-11-02: 6 mL

## 2018-11-02 MED ORDER — PROGESTERONE MICRONIZED 100 MG PO CAPS
200.0000 mg | ORAL_CAPSULE | Freq: Every day | ORAL | Status: DC
  Administered 2018-11-03 – 2018-11-04 (×2): 200 mg via ORAL
  Filled 2018-11-02 (×3): qty 2

## 2018-11-02 MED ORDER — SODIUM CHLORIDE 0.9 % IV SOLN
INTRAVENOUS | Status: DC | PRN
  Administered 2018-11-02: 20 ug/min via INTRAVENOUS

## 2018-11-02 MED ORDER — OXYCODONE HCL 5 MG/5ML PO SOLN: 5.0000 mg | Freq: Once | ORAL | Status: DC | PRN

## 2018-11-02 MED ORDER — FENTANYL CITRATE (PF) 100 MCG/2ML IJ SOLN
INTRAMUSCULAR | Status: DC | PRN
  Administered 2018-11-02 (×2): 25 ug via INTRAVENOUS

## 2018-11-02 MED ORDER — ACETAMINOPHEN 10 MG/ML IV SOLN
INTRAVENOUS | Status: DC | PRN
  Administered 2018-11-02: 1000 mg via INTRAVENOUS

## 2018-11-02 MED ORDER — CELECOXIB 200 MG PO CAPS
400.0000 mg | ORAL_CAPSULE | Freq: Once | ORAL | Status: AC
  Administered 2018-11-02: 400 mg via ORAL

## 2018-11-02 MED ORDER — PROPOFOL 500 MG/50ML IV EMUL
INTRAVENOUS | Status: AC
  Filled 2018-11-02: qty 50

## 2018-11-02 MED ORDER — SENNOSIDES-DOCUSATE SODIUM 8.6-50 MG PO TABS
1.0000 | ORAL_TABLET | Freq: Two times a day (BID) | ORAL | Status: DC
  Administered 2018-11-02 – 2018-11-04 (×4): 1 via ORAL
  Filled 2018-11-02 (×4): qty 1

## 2018-11-02 MED ORDER — PANTOPRAZOLE SODIUM 40 MG PO TBEC
40.0000 mg | DELAYED_RELEASE_TABLET | Freq: Two times a day (BID) | ORAL | Status: DC
  Administered 2018-11-02 – 2018-11-04 (×4): 40 mg via ORAL
  Filled 2018-11-02 (×4): qty 1

## 2018-11-02 MED ORDER — BUPIVACAINE HCL (PF) 0.25 % IJ SOLN
INTRAMUSCULAR | Status: DC | PRN
  Administered 2018-11-02: 60 mL

## 2018-11-02 MED ORDER — ACETAMINOPHEN 10 MG/ML IV SOLN
INTRAVENOUS | Status: AC
  Filled 2018-11-02: qty 100

## 2018-11-02 MED ORDER — GABAPENTIN 300 MG PO CAPS
ORAL_CAPSULE | ORAL | Status: AC
  Administered 2018-11-02: 300 mg via ORAL
  Filled 2018-11-02: qty 1

## 2018-11-02 MED ORDER — BUPIVACAINE HCL (PF) 0.5 % IJ SOLN
INTRAMUSCULAR | Status: AC
  Filled 2018-11-02: qty 10

## 2018-11-02 MED ORDER — FERROUS SULFATE 325 (65 FE) MG PO TABS
325.0000 mg | ORAL_TABLET | Freq: Two times a day (BID) | ORAL | Status: DC
  Administered 2018-11-03 – 2018-11-04 (×3): 325 mg via ORAL
  Filled 2018-11-02 (×3): qty 1

## 2018-11-02 MED ORDER — MAGNESIUM HYDROXIDE 400 MG/5ML PO SUSP
30.0000 mL | Freq: Every day | ORAL | Status: DC
  Administered 2018-11-02 – 2018-11-03 (×2): 30 mL via ORAL
  Filled 2018-11-02 (×2): qty 30

## 2018-11-02 MED ORDER — FENTANYL CITRATE (PF) 100 MCG/2ML IJ SOLN
INTRAMUSCULAR | Status: AC
  Filled 2018-11-02: qty 2

## 2018-11-02 MED ORDER — METOCLOPRAMIDE HCL 10 MG PO TABS
10.0000 mg | ORAL_TABLET | Freq: Three times a day (TID) | ORAL | Status: DC
  Administered 2018-11-02 – 2018-11-04 (×6): 10 mg via ORAL
  Filled 2018-11-02 (×6): qty 1

## 2018-11-02 MED ORDER — VITAMIN E 180 MG (400 UNIT) PO CAPS: 400.0000 [IU] | ORAL_CAPSULE | ORAL | Status: DC

## 2018-11-02 MED ORDER — METFORMIN HCL ER 500 MG PO TB24
500.0000 mg | ORAL_TABLET | Freq: Every day | ORAL | Status: DC
  Administered 2018-11-03 – 2018-11-04 (×2): 500 mg via ORAL
  Filled 2018-11-02 (×2): qty 1

## 2018-11-02 MED ORDER — LEVOTHYROXINE SODIUM 125 MCG PO TABS
125.0000 ug | ORAL_TABLET | Freq: Every day | ORAL | Status: DC
  Administered 2018-11-03 – 2018-11-04 (×2): 125 ug via ORAL
  Filled 2018-11-02 (×3): qty 1

## 2018-11-02 MED ORDER — MIDAZOLAM HCL 5 MG/5ML IJ SOLN
INTRAMUSCULAR | Status: DC | PRN
  Administered 2018-11-02: 2 mg via INTRAVENOUS

## 2018-11-02 MED ORDER — ALUM & MAG HYDROXIDE-SIMETH 200-200-20 MG/5ML PO SUSP: 30.0000 mL | ORAL | Status: DC | PRN

## 2018-11-02 MED ORDER — TRANEXAMIC ACID-NACL 1000-0.7 MG/100ML-% IV SOLN
1000.0000 mg | Freq: Once | INTRAVENOUS | Status: AC
  Administered 2018-11-02: 1000 mg via INTRAVENOUS
  Filled 2018-11-02: qty 100

## 2018-11-02 MED ORDER — PROPOFOL 500 MG/50ML IV EMUL
INTRAVENOUS | Status: DC | PRN
  Administered 2018-11-02: 100 ug/kg/min via INTRAVENOUS

## 2018-11-02 MED ORDER — GABAPENTIN 300 MG PO CAPS
300.0000 mg | ORAL_CAPSULE | Freq: Every day | ORAL | Status: DC
  Administered 2018-11-02 – 2018-11-03 (×2): 300 mg via ORAL
  Filled 2018-11-02 (×2): qty 1

## 2018-11-02 MED ORDER — ACETAMINOPHEN 10 MG/ML IV SOLN
1000.0000 mg | Freq: Four times a day (QID) | INTRAVENOUS | Status: AC
  Administered 2018-11-03 (×2): 1000 mg via INTRAVENOUS
  Filled 2018-11-02 (×4): qty 100

## 2018-11-02 MED ORDER — HYDROMORPHONE HCL 1 MG/ML IJ SOLN: 0.5000 mg | INTRAMUSCULAR | Status: DC | PRN

## 2018-11-02 MED ORDER — INSULIN ASPART 100 UNIT/ML ~~LOC~~ SOLN: 0.0000 [IU] | Freq: Three times a day (TID) | SUBCUTANEOUS | Status: DC

## 2018-11-02 MED ORDER — CLINDAMYCIN PHOSPHATE 600 MG/50ML IV SOLN
600.0000 mg | Freq: Four times a day (QID) | INTRAVENOUS | Status: AC
  Administered 2018-11-02 – 2018-11-03 (×4): 600 mg via INTRAVENOUS
  Filled 2018-11-02 (×4): qty 50

## 2018-11-02 MED ORDER — OXYCODONE HCL 5 MG PO TABS
5.0000 mg | ORAL_TABLET | ORAL | Status: DC | PRN
  Administered 2018-11-03 (×2): 5 mg via ORAL
  Filled 2018-11-02: qty 1

## 2018-11-02 MED ORDER — ACETAMINOPHEN 325 MG PO TABS: 325.0000 mg | ORAL_TABLET | Freq: Four times a day (QID) | ORAL | Status: DC | PRN

## 2018-11-02 MED ORDER — CELECOXIB 200 MG PO CAPS
ORAL_CAPSULE | ORAL | Status: AC
  Administered 2018-11-02: 400 mg via ORAL
  Filled 2018-11-02: qty 2

## 2018-11-02 MED ORDER — ONDANSETRON HCL 4 MG/2ML IJ SOLN: 4.0000 mg | Freq: Four times a day (QID) | INTRAMUSCULAR | Status: DC | PRN

## 2018-11-02 MED ORDER — OXYCODONE HCL 5 MG PO TABS: 5.0000 mg | ORAL_TABLET | Freq: Once | ORAL | Status: DC | PRN

## 2018-11-02 MED ORDER — CLINDAMYCIN PHOSPHATE 900 MG/50ML IV SOLN
INTRAVENOUS | Status: DC | PRN
  Administered 2018-11-02: 900 mg via INTRAVENOUS

## 2018-11-02 MED ORDER — METOCLOPRAMIDE HCL 10 MG PO TABS: 5.0000 mg | ORAL_TABLET | Freq: Three times a day (TID) | ORAL | Status: DC | PRN

## 2018-11-02 MED ORDER — MENTHOL 3 MG MT LOZG
1.0000 | LOZENGE | OROMUCOSAL | Status: DC | PRN
  Filled 2018-11-02: qty 9

## 2018-11-02 MED ORDER — TETRACAINE HCL 1 % IJ SOLN
INTRAMUSCULAR | Status: AC
  Filled 2018-11-02: qty 2

## 2018-11-02 MED ORDER — TRANEXAMIC ACID-NACL 1000-0.7 MG/100ML-% IV SOLN
INTRAVENOUS | Status: DC | PRN
  Administered 2018-11-02: 1000 mg via INTRAVENOUS

## 2018-11-02 MED ORDER — DIPHENHYDRAMINE HCL 12.5 MG/5ML PO ELIX: 12.5000 mg | ORAL_SOLUTION | ORAL | Status: DC | PRN

## 2018-11-02 MED ORDER — SODIUM CHLORIDE 0.9 % IV SOLN
INTRAVENOUS | Status: DC
  Administered 2018-11-02: 14:00:00 via INTRAVENOUS

## 2018-11-02 MED ORDER — BISACODYL 10 MG RE SUPP: 10.0000 mg | Freq: Every day | RECTAL | Status: DC | PRN

## 2018-11-02 MED ORDER — DEXAMETHASONE SODIUM PHOSPHATE 10 MG/ML IJ SOLN
INTRAMUSCULAR | Status: AC
  Administered 2018-11-02: 8 mg via INTRAVENOUS
  Filled 2018-11-02: qty 1

## 2018-11-02 MED ORDER — PHENOL 1.4 % MT LIQD
1.0000 | OROMUCOSAL | Status: DC | PRN
  Filled 2018-11-02: qty 177

## 2018-11-02 MED ORDER — TETRACAINE HCL 1 % IJ SOLN
INTRAMUSCULAR | Status: DC | PRN
  Administered 2018-11-02: 10 mg via INTRASPINAL

## 2018-11-02 MED ORDER — METOCLOPRAMIDE HCL 5 MG/ML IJ SOLN: 5.0000 mg | Freq: Three times a day (TID) | INTRAMUSCULAR | Status: DC | PRN

## 2018-11-02 MED ORDER — LIOTHYRONINE SODIUM 5 MCG PO TABS
5.0000 ug | ORAL_TABLET | Freq: Every day | ORAL | Status: DC
  Administered 2018-11-03 – 2018-11-04 (×2): 5 ug via ORAL
  Filled 2018-11-02 (×2): qty 1

## 2018-11-02 MED ORDER — FLEET ENEMA 7-19 GM/118ML RE ENEM: 1.0000 | ENEMA | Freq: Once | RECTAL | Status: DC | PRN

## 2018-11-02 MED ORDER — GABAPENTIN 300 MG PO CAPS
300.0000 mg | ORAL_CAPSULE | Freq: Once | ORAL | Status: AC
  Administered 2018-11-02: 300 mg via ORAL

## 2018-11-02 MED ORDER — LACTATED RINGERS IV SOLN
INTRAVENOUS | Status: DC | PRN
  Administered 2018-11-02: 18:00:00 via INTRAVENOUS

## 2018-11-02 MED ORDER — CLINDAMYCIN PHOSPHATE 900 MG/50ML IV SOLN
INTRAVENOUS | Status: AC
  Filled 2018-11-02: qty 50

## 2018-11-02 MED ORDER — ENOXAPARIN SODIUM 30 MG/0.3ML ~~LOC~~ SOLN
30.0000 mg | Freq: Two times a day (BID) | SUBCUTANEOUS | Status: DC
  Administered 2018-11-03 – 2018-11-04 (×3): 30 mg via SUBCUTANEOUS
  Filled 2018-11-02 (×3): qty 0.3

## 2018-11-02 MED ORDER — VITAMIN D (CHOLECALCIFEROL) 25 MCG (1000 UT) PO CAPS: 250.0000 ug | ORAL_CAPSULE | Freq: Every day | ORAL | Status: DC

## 2018-11-02 MED ORDER — DEXAMETHASONE SODIUM PHOSPHATE 10 MG/ML IJ SOLN
8.0000 mg | Freq: Once | INTRAMUSCULAR | Status: AC
  Administered 2018-11-02: 8 mg via INTRAVENOUS

## 2018-11-02 MED ORDER — FENTANYL CITRATE (PF) 100 MCG/2ML IJ SOLN: 25.0000 ug | INTRAMUSCULAR | Status: DC | PRN

## 2018-11-02 MED ORDER — OXYCODONE HCL 5 MG PO TABS
10.0000 mg | ORAL_TABLET | ORAL | Status: DC | PRN
  Filled 2018-11-02: qty 2

## 2018-11-02 MED ORDER — SODIUM CHLORIDE 0.9 % IV SOLN
INTRAVENOUS | Status: DC | PRN
  Administered 2018-11-02: 60 mL

## 2018-11-02 MED ORDER — MIDAZOLAM HCL 2 MG/2ML IJ SOLN
INTRAMUSCULAR | Status: AC
  Filled 2018-11-02: qty 2

## 2018-11-02 MED ORDER — CELECOXIB 200 MG PO CAPS
200.0000 mg | ORAL_CAPSULE | Freq: Two times a day (BID) | ORAL | Status: DC
  Administered 2018-11-02 – 2018-11-04 (×4): 200 mg via ORAL
  Filled 2018-11-02 (×4): qty 1

## 2018-11-02 SURGICAL SUPPLY — 72 items
ATTUNE MED DOME PAT 38 KNEE (Knees) ×2 IMPLANT
ATTUNE PS FEM RT SZ 5 CEM KNEE (Femur) ×2 IMPLANT
ATTUNE PSRP INSR SZ5 5 KNEE (Insert) ×2 IMPLANT
BASE TIBIAL ROT PLAT SZ 5 KNEE (Knees) ×1 IMPLANT
BATTERY INSTRU NAVIGATION (MISCELLANEOUS) ×8 IMPLANT
BLADE SAW 70X12.5 (BLADE) ×2 IMPLANT
BLADE SAW 90X13X1.19 OSCILLAT (BLADE) ×2 IMPLANT
BLADE SAW 90X25X1.19 OSCILLAT (BLADE) ×2 IMPLANT
BONE CEMENT GENTAMICIN (Cement) ×4 IMPLANT
CANISTER SUCT 1200ML W/VALVE (MISCELLANEOUS) ×2 IMPLANT
CANISTER SUCT 3000ML PPV (MISCELLANEOUS) ×4 IMPLANT
CEMENT BONE GENTAMICIN 40 (Cement) ×2 IMPLANT
COOLER POLAR GLACIER W/PUMP (MISCELLANEOUS) ×2 IMPLANT
COVER WAND RF STERILE (DRAPES) ×2 IMPLANT
CUFF TOURN 24 STER (MISCELLANEOUS) IMPLANT
CUFF TOURN 30 STER DUAL PORT (MISCELLANEOUS) ×2 IMPLANT
DRAPE SHEET LG 3/4 BI-LAMINATE (DRAPES) ×2 IMPLANT
DRSG DERMACEA 8X12 NADH (GAUZE/BANDAGES/DRESSINGS) ×2 IMPLANT
DRSG OPSITE POSTOP 4X12 (GAUZE/BANDAGES/DRESSINGS) ×2 IMPLANT
DRSG OPSITE POSTOP 4X14 (GAUZE/BANDAGES/DRESSINGS) ×2 IMPLANT
DRSG TEGADERM 4X4.75 (GAUZE/BANDAGES/DRESSINGS) ×2 IMPLANT
DURAPREP 26ML APPLICATOR (WOUND CARE) ×4 IMPLANT
ELECT CAUTERY BLADE 6.4 (BLADE) ×2 IMPLANT
ELECT REM PT RETURN 9FT ADLT (ELECTROSURGICAL) ×2
ELECTRODE REM PT RTRN 9FT ADLT (ELECTROSURGICAL) ×1 IMPLANT
EX-PIN ORTHOLOCK NAV 4X150 (PIN) ×4 IMPLANT
GLOVE BIOGEL M STRL SZ7.5 (GLOVE) ×4 IMPLANT
GLOVE BIOGEL PI IND STRL 9 (GLOVE) ×1 IMPLANT
GLOVE BIOGEL PI INDICATOR 9 (GLOVE) ×1
GLOVE INDICATOR 8.0 STRL GRN (GLOVE) ×2 IMPLANT
GLOVE SURG SYN 9.0  PF PI (GLOVE) ×1
GLOVE SURG SYN 9.0 PF PI (GLOVE) ×1 IMPLANT
GOWN STRL REUS W/ TWL LRG LVL3 (GOWN DISPOSABLE) ×2 IMPLANT
GOWN STRL REUS W/TWL 2XL LVL3 (GOWN DISPOSABLE) ×2 IMPLANT
GOWN STRL REUS W/TWL LRG LVL3 (GOWN DISPOSABLE) ×2
HEMOVAC 400CC 10FR (MISCELLANEOUS) ×2 IMPLANT
HOLDER FOLEY CATH W/STRAP (MISCELLANEOUS) ×2 IMPLANT
HOOD PEEL AWAY FLYTE STAYCOOL (MISCELLANEOUS) ×4 IMPLANT
KIT TURNOVER KIT A (KITS) ×2 IMPLANT
KNIFE SCULPS 14X20 (INSTRUMENTS) ×2 IMPLANT
LABEL OR SOLS (LABEL) ×2 IMPLANT
NDL SAFETY ECLIPSE 18X1.5 (NEEDLE) ×1 IMPLANT
NEEDLE HYPO 18GX1.5 SHARP (NEEDLE) ×1
NEEDLE SPNL 20GX3.5 QUINCKE YW (NEEDLE) ×4 IMPLANT
NS IRRIG 500ML POUR BTL (IV SOLUTION) ×2 IMPLANT
PACK TOTAL KNEE (MISCELLANEOUS) ×2 IMPLANT
PAD WRAPON POLAR KNEE (MISCELLANEOUS) ×1 IMPLANT
PENCIL SMOKE ULTRAEVAC 22 CON (MISCELLANEOUS) ×2 IMPLANT
PIN DRILL QUICK PACK ×2 IMPLANT
PIN FIXATION 1/8DIA X 3INL (PIN) ×6 IMPLANT
PULSAVAC PLUS IRRIG FAN TIP (DISPOSABLE) ×2
SOL .9 NS 3000ML IRR  AL (IV SOLUTION) ×1
SOL .9 NS 3000ML IRR UROMATIC (IV SOLUTION) ×1 IMPLANT
SOL PREP PVP 2OZ (MISCELLANEOUS) ×2
SOLUTION PREP PVP 2OZ (MISCELLANEOUS) ×1 IMPLANT
SPONGE DRAIN TRACH 4X4 STRL 2S (GAUZE/BANDAGES/DRESSINGS) ×2 IMPLANT
STAPLER SKIN PROX 35W (STAPLE) ×2 IMPLANT
STOCKINETTE BIAS CUT 6 980064 (GAUZE/BANDAGES/DRESSINGS) ×2 IMPLANT
STRAP TIBIA SHORT (MISCELLANEOUS) ×2 IMPLANT
SUCTION FRAZIER HANDLE 10FR (MISCELLANEOUS) ×1
SUCTION TUBE FRAZIER 10FR DISP (MISCELLANEOUS) ×1 IMPLANT
SUT VIC AB 0 CT1 36 (SUTURE) ×2 IMPLANT
SUT VIC AB 1 CT1 36 (SUTURE) ×4 IMPLANT
SUT VIC AB 2-0 CT2 27 (SUTURE) ×2 IMPLANT
SYR 20CC LL (SYRINGE) ×2 IMPLANT
SYR 30ML LL (SYRINGE) ×4 IMPLANT
TIBIAL BASE ROT PLAT SZ 5 KNEE (Knees) ×2 IMPLANT
TIP FAN IRRIG PULSAVAC PLUS (DISPOSABLE) ×1 IMPLANT
TOWEL OR 17X26 4PK STRL BLUE (TOWEL DISPOSABLE) ×2 IMPLANT
TOWER CARTRIDGE SMART MIX (DISPOSABLE) ×2 IMPLANT
TRAY FOLEY MTR SLVR 16FR STAT (SET/KITS/TRAYS/PACK) ×2 IMPLANT
WRAPON POLAR PAD KNEE (MISCELLANEOUS) ×2

## 2018-11-02 NOTE — Anesthesia Post-op Follow-up Note (Signed)
Anesthesia QCDR form completed.        

## 2018-11-02 NOTE — Op Note (Signed)
OPERATIVE NOTE  DATE OF SURGERY:  11/02/2018  PATIENT NAME:  Jodi Day   DOB: Jan 22, 1946  MRN: [id number]  PRE-OPERATIVE DIAGNOSIS: Degenerative arthrosis of the right knee, primary  POST-OPERATIVE DIAGNOSIS:  Same  PROCEDURE:  Right total knee arthroplasty using computer-assisted navigation  SURGEON:  Jena Gauss. M.D.  ASSISTANT:  Van Clines, PA (present and scrubbed throughout the case, critical for assistance with exposure, retraction, instrumentation, and closure)  ANESTHESIA: spinal  ESTIMATED BLOOD LOSS: 50 mL  FLUIDS REPLACED: 1100 mL of crystalloid  TOURNIQUET TIME: 93 minutes  DRAINS: 2 medium Hemovac drains  SOFT TISSUE RELEASES: Anterior cruciate ligament, posterior cruciate ligament, deep medial collateral ligament, patellofemoral ligament  IMPLANTS UTILIZED: DePuy Attune size 5 posterior stabilized femoral component (cemented), size 5 rotating platform tibial component (cemented), 38 mm medialized dome patella (cemented), and a 5 mm stabilized rotating platform polyethylene insert.  INDICATIONS FOR SURGERY: Jodi Day is a 72 y.o. year old female with a long history of progressive knee pain. X-rays demonstrated severe degenerative changes in tricompartmental fashion. The patient had not seen any significant improvement despite conservative nonsurgical intervention. After discussion of the risks and benefits of surgical intervention, the patient expressed understanding of the risks benefits and agree with plans for total knee arthroplasty.   The risks, benefits, and alternatives were discussed at length including but not limited to the risks of infection, bleeding, nerve injury, stiffness, blood clots, the need for revision surgery, cardiopulmonary complications, among others, and they were willing to proceed.  PROCEDURE IN DETAIL: The patient was brought into the operating room and, after adequate spinal anesthesia was achieved, a tourniquet was placed  on the patient's upper thigh. The patient's knee and leg were cleaned and prepped with alcohol and DuraPrep and draped in the usual sterile fashion. A "timeout" was performed as per usual protocol. The lower extremity was exsanguinated using an Esmarch, and the tourniquet was inflated to 300 mmHg. An anterior longitudinal incision was made followed by a standard mid vastus approach. The deep fibers of the medial collateral ligament were elevated in a subperiosteal fashion off of the medial flare of the tibia so as to maintain a continuous soft tissue sleeve. The patella was subluxed laterally and the patellofemoral ligament was incised. Inspection of the knee demonstrated severe degenerative changes with full-thickness loss of articular cartilage. Osteophytes were debrided using a rongeur. Anterior and posterior cruciate ligaments were excised. Two 4.0 mm Schanz pins were inserted in the femur and into the tibia for attachment of the array of trackers used for computer-assisted navigation. Hip center was identified using a circumduction technique. Distal landmarks were mapped using the computer. The distal femur and proximal tibia were mapped using the computer. The distal femoral cutting guide was positioned using computer-assisted navigation so as to achieve a 5 distal valgus cut. The femur was sized and it was felt that a size 5 femoral component was appropriate. A size 5 femoral cutting guide was positioned and the anterior cut was performed and verified using the computer. This was followed by completion of the posterior and chamfer cuts. Femoral cutting guide for the central box was then positioned in the center box cut was performed.  Attention was then directed to the proximal tibia. Medial and lateral menisci were excised. The extramedullary tibial cutting guide was positioned using computer-assisted navigation so as to achieve a 0 varus-valgus alignment and 3 posterior slope. The cut was performed and  verified using the computer. The proximal tibia  was sized and it was felt that a size 5 tibial tray was appropriate. Tibial and femoral trials were inserted followed by insertion of a 5 mm polyethylene insert. This allowed for excellent mediolateral soft tissue balancing both in flexion and in full extension. Finally, the patella was cut and prepared so as to accommodate a 38 mm medialized dome patella. A patella trial was placed and the knee was placed through a range of motion with excellent patellar tracking appreciated. The femoral trial was removed after debridement of posterior osteophytes. The central post-hole for the tibial component was reamed followed by insertion of a keel punch. Tibial trials were then removed. Cut surfaces of bone were irrigated with copious amounts of normal saline with antibiotic solution using pulsatile lavage and then suctioned dry. Polymethylmethacrylate cement with gentamicin was prepared in the usual fashion using a vacuum mixer. Cement was applied to the cut surface of the proximal tibia as well as along the undersurface of a size 5 rotating platform tibial component. Tibial component was positioned and impacted into place. Excess cement was removed using Personal assistantreer elevators. Cement was then applied to the cut surfaces of the femur as well as along the posterior flanges of the size 5 femoral component. The femoral component was positioned and impacted into place. Excess cement was removed using Personal assistantreer elevators. A 5 mm polyethylene trial was inserted and the knee was brought into full extension with steady axial compression applied. Finally, cement was applied to the backside of a 38 mm medialized dome patella and the patellar component was positioned and patellar clamp applied. Excess cement was removed using Personal assistantreer elevators. After adequate curing of the cement, the tourniquet was deflated after a total tourniquet time of 93 minutes. Hemostasis was achieved using electrocautery.  The knee was irrigated with copious amounts of normal saline with antibiotic solution using pulsatile lavage and then suctioned dry. 20 mL of 1.3% Exparel and 60 mL of 0.25% Marcaine in 40 mL of normal saline was injected along the posterior capsule, medial and lateral gutters, and along the arthrotomy site. A 5 mm stabilized rotating platform polyethylene insert was inserted and the knee was placed through a range of motion with excellent mediolateral soft tissue balancing appreciated and excellent patellar tracking noted. 2 medium drains were placed in the wound bed and brought out through separate stab incisions. The medial parapatellar portion of the incision was reapproximated using interrupted sutures of #1 Vicryl. Subcutaneous tissue was approximated in layers using first #0 Vicryl followed #2-0 Vicryl. The skin was approximated with skin staples. A sterile dressing was applied.  The patient tolerated the procedure well and was transported to the recovery room in stable condition.    Khya Halls P. Angie FavaHooten, Jr., M.D.

## 2018-11-02 NOTE — Anesthesia Preprocedure Evaluation (Signed)
Anesthesia Evaluation  Patient identified by MRN, date of birth, ID band Patient awake    Reviewed: Allergy & Precautions, H&P , NPO status , Patient's Chart, lab work & pertinent test results  History of Anesthesia Complications Negative for: history of anesthetic complications  Airway Mallampati: III  TM Distance: <3 FB Neck ROM: full    Dental  (+) Chipped   Pulmonary neg shortness of breath, former smoker,           Cardiovascular Exercise Tolerance: Good (-) angina(-) Past MI and (-) DOE      Neuro/Psych negative neurological ROS  negative psych ROS   GI/Hepatic negative GI ROS, Neg liver ROS,   Endo/Other  diabetes, Type 2Hypothyroidism   Renal/GU      Musculoskeletal  (+) Arthritis ,   Abdominal   Peds  Hematology negative hematology ROS (+)   Anesthesia Other Findings Past Medical History: No date: Anxiety No date: Diabetes mellitus without complication (HCC) No date: Hypothyroidism No date: Thyroid disease  Past Surgical History: 1992: ABDOMINAL HYSTERECTOMY     Comment:  fibroids. ovaries intact 1996: CHOLECYSTECTOMY 2014: EYE SURGERY; Bilateral     Comment:  cataract surgery 1973: thyroidectomy 1973: tonsillectomy and adenoidectomy  BMI    Body Mass Index:  28.32 kg/m      Reproductive/Obstetrics negative OB ROS                             Anesthesia Physical Anesthesia Plan  ASA: III  Anesthesia Plan: Spinal   Post-op Pain Management:    Induction:   PONV Risk Score and Plan:   Airway Management Planned: Natural Airway and Nasal Cannula  Additional Equipment:   Intra-op Plan:   Post-operative Plan:   Informed Consent: I have reviewed the patients History and Physical, chart, labs and discussed the procedure including the risks, benefits and alternatives for the proposed anesthesia with the patient or authorized representative who has indicated  his/her understanding and acceptance.   Dental Advisory Given  Plan Discussed with: Anesthesiologist, CRNA and Surgeon  Anesthesia Plan Comments: (Patient reports no bleeding problems and no anticoagulant use.  Plan for spinal with backup GA  Patient consented for risks of anesthesia including but not limited to:  - adverse reactions to medications - risk of bleeding, infection, nerve damage and headache - risk of failed spinal - damage to teeth, lips or other oral mucosa - sore throat or hoarseness - Damage to heart, brain, lungs or loss of life  Patient voiced understanding.)        Anesthesia Quick Evaluation

## 2018-11-02 NOTE — H&P (Signed)
The patient has been re-examined, and the chart reviewed, and there have been no interval changes to the documented history and physical.    The risks, benefits, and alternatives have been discussed at length. The patient expressed understanding of the risks benefits and agreed with plans for surgical intervention.  Jodi Day P. Margaretta Chittum, Jr. M.D.    

## 2018-11-02 NOTE — Anesthesia Procedure Notes (Signed)
Spinal  Patient location during procedure: OR Start time: 11/02/2018 3:30 PM End time: 11/02/2018 3:35 PM Staffing Anesthesiologist: Molli Barrows, MD Resident/CRNA: Hedda Slade, CRNA Performed: resident/CRNA  Preanesthetic Checklist Completed: patient identified, site marked, surgical consent, pre-op evaluation, timeout performed, IV checked, risks and benefits discussed and monitors and equipment checked Spinal Block Patient position: sitting Prep: ChloraPrep Patient monitoring: heart rate, continuous pulse ox, blood pressure and cardiac monitor Approach: midline Location: L3-4 Injection technique: single-shot Needle Needle type: Whitacre and Introducer  Needle gauge: 24 G Needle length: 9 cm Additional Notes Negative paresthesia. Negative blood return. Positive free-flowing CSF. Expiration date of kit checked and confirmed. Patient tolerated procedure well, without complications.

## 2018-11-02 NOTE — Transfer of Care (Signed)
Immediate Anesthesia Transfer of Care Note  Patient: Jodi Day  Procedure(s) Performed: COMPUTER ASSISTED TOTAL KNEE ARTHROPLASTY (Right Knee)  Patient Location: PACU  Anesthesia Type:Spinal  Level of Consciousness: awake, alert , oriented and patient cooperative  Airway & Oxygen Therapy: Patient Spontanous Breathing and Patient connected to face mask oxygen  Post-op Assessment: Report given to RN and Post -op Vital signs reviewed and stable  Post vital signs: Reviewed and stable  Last Vitals:  Vitals Value Taken Time  BP 120/67 11/02/2018  6:52 PM  Temp    Pulse 83 11/02/2018  6:53 PM  Resp 17 11/02/2018  6:53 PM  SpO2 100 % 11/02/2018  6:53 PM  Vitals shown include unvalidated device data.  Last Pain:  Vitals:   11/02/18 1404  TempSrc: Oral         Complications: No apparent anesthesia complications

## 2018-11-03 ENCOUNTER — Encounter: Payer: Medicare Other | Admitting: Family Medicine

## 2018-11-03 ENCOUNTER — Other Ambulatory Visit: Payer: Self-pay

## 2018-11-03 ENCOUNTER — Encounter: Payer: Self-pay | Admitting: Orthopedic Surgery

## 2018-11-03 LAB — GLUCOSE, CAPILLARY
Glucose-Capillary: 101 mg/dL — ABNORMAL HIGH (ref 70–99)
Glucose-Capillary: 115 mg/dL — ABNORMAL HIGH (ref 70–99)
Glucose-Capillary: 65 mg/dL — ABNORMAL LOW (ref 70–99)
Glucose-Capillary: 96 mg/dL (ref 70–99)

## 2018-11-03 MED ORDER — TRAMADOL HCL 50 MG PO TABS: 50.0000 mg | ORAL_TABLET | Freq: Four times a day (QID) | ORAL | 0 refills | Status: DC | PRN

## 2018-11-03 MED ORDER — OXYCODONE HCL 5 MG PO TABS: 5.0000 mg | ORAL_TABLET | ORAL | 0 refills | Status: DC | PRN

## 2018-11-03 MED ORDER — ENOXAPARIN SODIUM 40 MG/0.4ML ~~LOC~~ SOLN: 40.0000 mg | SUBCUTANEOUS | 0 refills | Status: DC

## 2018-11-03 NOTE — Progress Notes (Signed)
   Subjective: 1 Day Post-Op Procedure(s) (LRB): COMPUTER ASSISTED TOTAL KNEE ARTHROPLASTY (Right) Patient reports pain as mild.   Patient is well, and has had no acute complaints or problems We will start therapy today.  Dangle leg at side of bed this morning with no problems. Plan is to go Home after hospital stay. no nausea and no vomiting Patient denies any chest pains or shortness of breath. Patient slept well during the night. Voicing no complaints other than she does not like the bone foam.  Pleased with results so far  Objective: Vital signs in last 24 hours: Temp:  [96.2 F (35.7 C)-98.5 F (36.9 C)] 97.6 F (36.4 C) (12/19 0419) Pulse Rate:  [55-87] 67 (12/19 0419) Resp:  [12-22] 19 (12/19 0419) BP: (98-150)/(63-92) 126/73 (12/19 0419) SpO2:  [98 %-100 %] 99 % (12/19 0419) Weight:  [87 kg] 87 kg (12/18 1404) Heels are non tender and elevated off the bed using rolled towels Intake/Output from previous day: 12/18 0701 - 12/19 0700 In: 1859.7 [I.V.:1705.4; IV Piggyback:154.3] Out: 2830 [Urine:2590; Drains:190; Blood:50] Intake/Output this shift: Total I/O In: 759.7 [I.V.:605.4; IV Piggyback:154.3] Out: 2230 [Urine:2040; Drains:190]  No results for input(s): HGB in the last 72 hours. No results for input(s): WBC, RBC, HCT, PLT in the last 72 hours. No results for input(s): NA, K, CL, CO2, BUN, CREATININE, GLUCOSE, CALCIUM in the last 72 hours. No results for input(s): LABPT, INR in the last 72 hours.  EXAM General - Patient is Alert, Appropriate and Oriented Extremity - Neurologically intact Neurovascular intact Sensation intact distally Intact pulses distally Dorsiflexion/Plantar flexion intact Compartment soft Dressing - dressing C/D/I Motor Function - intact, moving foot and toes well on exam.  Able to do straight leg raise on her own  Past Medical History:  Diagnosis Date  . Anxiety   . Diabetes mellitus without complication (HCC)   . Hypothyroidism    . Thyroid disease     Assessment/Plan: 1 Day Post-Op Procedure(s) (LRB): COMPUTER ASSISTED TOTAL KNEE ARTHROPLASTY (Right) Active Problems:   Total knee replacement status  Estimated body mass index is 28.32 kg/m as calculated from the following:   Height as of this encounter: 5\' 9"  (1.753 m).   Weight as of this encounter: 87 kg. Advance diet Up with therapy D/C IV fluids Plan for discharge tomorrow Discharge home with home health  Labs: None DVT Prophylaxis - Lovenox, Foot Pumps and TED hose Weight-Bearing as tolerated to right leg D/C O2 and Pulse OX and try on Room Air Begin working on bowel movement  Aviona Martenson R. [facility] PA [facility] 11/03/2018, 6:54 AM

## 2018-11-03 NOTE — Evaluation (Signed)
Occupational Therapy Evaluation Patient Details Name: Jodi Day MRN: [id number] DOB: 1946/05/09 Today's Date: 11/03/2018    History of Present Illness Pt is a 72 y.o. female s/p R TKA secondary degenerative arthrosis 11/02/18.  PMH includes DM and thyroid disease.   Clinical Impression   Mrs. Jodi BlazingSmith seen for OT evaluation this date, POD#1 from above surgery. Pt was independent in all ADLs prior to surgery. Pt is eager to return to PLOF with less pain and improved safety and independence. Pt currently requires minimal assist for LB dressing while in seated position due to pain and limited AROM of R knee. Pt instructed in polar care mgt, falls prevention strategies, home/routines modifications, DME/AE for LB bathing and dressing tasks, and compression stocking mgt. Pt verbalized understanding of provided education. Do not currently anticipate any OT needs following this hospitalization. Will sign off.     Follow Up Recommendations  No OT follow up    Equipment Recommendations  Other (comment)(Reacher)    Recommendations for Other Services       Precautions / Restrictions Precautions Precautions: Knee Precaution Booklet Issued: No Required Braces or Orthoses: Knee Immobilizer - Right Knee Immobilizer - Right: Discontinue once straight leg raise with < 10 degree lag Restrictions Weight Bearing Restrictions: Yes RLE Weight Bearing: Weight bearing as tolerated      Mobility Bed Mobility     General bed mobility comments: Deferred, Pt up in recliner   Transfers          Balance Overall balance assessment: Needs assistance Sitting-balance support: No upper extremity supported Sitting balance-Leahy Scale: Good Sitting balance - Comments: steady sitting reaching within BOS                           ADL either performed or assessed with clinical judgement   ADL Overall ADL's : Needs assistance/impaired                                        General ADL Comments: Min assist for lower body ADL; Mod assist with compression stockings     Vision Baseline Vision/History: Wears glasses Wears Glasses: Distance only Patient Visual Report: No change from baseline       Perception     Praxis      Pertinent Vitals/Pain Pain Assessment: 0-10 Pain Score: 1  Pain Location: R knee Pain Descriptors / Indicators: Sore Pain Intervention(s): Limited activity within patient's tolerance;Monitored during session;Premedicated before session;Repositioned;Ice applied     Hand Dominance     Extremity/Trunk Assessment Upper Extremity Assessment Upper Extremity Assessment: Overall WFL for tasks assessed   Lower Extremity Assessment Lower Extremity Assessment: Defer to PT evaluation;RLE deficits/detail RLE Deficits / Details: able to perform R LE SLR x10 reps independently; expected post-op strength/ROM impairments.  RLE: Unable to fully assess due to pain LLE Deficits / Details: strength and ROM WFL   Cervical / Trunk Assessment Cervical / Trunk Assessment: Normal   Communication Communication Communication: No difficulties   Cognition Arousal/Alertness: Awake/alert Behavior During Therapy: WFL for tasks assessed/performed Overall Cognitive Status: Within Functional Limits for tasks assessed                                     General Comments  R knee polar care and dressings in place  Exercises   Other Exercises: Pt educated in compression stocking mgt, polar care mgt, falls prevention, pet care considerations, AE/DME, and home/routines modifications.   Shoulder Instructions      Home Living Family/patient expects to be discharged to:: Private residence Living Arrangements: Spouse/significant other Available Help at Discharge: Family Type of Home: House Home Access: Stairs to enter Entergy CorporationEntrance Stairs-Number of Steps: 1 Entrance Stairs-Rails: None Home Layout: Two level;Able to live on main level with  bedroom/bathroom     Bathroom Shower/Tub: Producer, television/film/videoWalk-in shower   Bathroom Toilet: Handicapped height     Home Equipment: Cane - single point;Shower seat - built in          Prior Functioning/Environment Level of Independence: Independent        Comments: Pt independent with ADL, IADL, Mobility, driving, working, no falls.        OT Problem List: Decreased strength;Decreased range of motion;Decreased knowledge of use of DME or AE      OT Treatment/Interventions:      OT Goals(Current goals can be found in the care plan section) Acute Rehab OT Goals Patient Stated Goal: Return to PLOF OT Goal Formulation: All assessment and education complete, DC therapy  OT Frequency:     Barriers to D/C:            Co-evaluation              AM-PAC OT "6 Clicks" Daily Activity     Outcome Measure Help from another person eating meals?: None Help from another person taking care of personal grooming?: None Help from another person toileting, which includes using toliet, bedpan, or urinal?: A Little Help from another person bathing (including washing, rinsing, drying)?: A Little Help from another person to put on and taking off regular upper body clothing?: None Help from another person to put on and taking off regular lower body clothing?: A Little 6 Click Score: 21   End of Session    Activity Tolerance: Patient tolerated treatment well Patient left: in chair;with call bell/phone within reach;with chair alarm set;with SCD's reapplied;Other (comment)(polar care applied)  OT Visit Diagnosis: Other abnormalities of gait and mobility (R26.89)                Time: [id number] OT Time Calculation (min): 20 min Charges:  OT General Charges $OT Visit: 1 Visit OT Evaluation $OT Eval Low Complexity: 1 Low OT Treatments $Self Care/Home Management : 8-22 mins  Jodi Day, OTR/L 11/03/18, 11:30 AM

## 2018-11-03 NOTE — Evaluation (Signed)
Physical Therapy Evaluation Patient Details Name: Jodi Day MRN: [id number] DOB: May 23, 1946 Today's Date: 11/03/2018   History of Present Illness  Pt is a 72 y.o. female s/p R TKA secondary degenerative arthrosis 11/02/18.  PMH includes DM and thyroid disease.  Clinical Impression  Prior to hospital admission, pt was independent with functional mobility.  Pt lives with her husband on main level of home with 1 step to enter (no railing).  Currently pt is SBA semi-supine to sit; CGA with transfers; and CGA ambulating 60 feet with RW.  Able to perform R LE SLR independently so no KI utilized.  Pain 1/10 R knee beginning/end of session and pt reporting getting up and moving felt "good".  R knee flexion AROM 85 degrees.  Pt would benefit from skilled PT to address noted impairments and functional limitations (see below for any additional details).  Upon hospital discharge, recommend pt discharge to home with HHPT.    Follow Up Recommendations Home health PT    Equipment Recommendations  Rolling walker with 5" wheels    Recommendations for Other Services OT consult     Precautions / Restrictions Precautions Precautions: Fall;Knee Precaution Booklet Issued: Yes (comment) Required Braces or Orthoses: Knee Immobilizer - Right Knee Immobilizer - Right: Discontinue once straight leg raise with < 10 degree lag Restrictions Weight Bearing Restrictions: Yes RLE Weight Bearing: Weight bearing as tolerated      Mobility  Bed Mobility Overal bed mobility: Needs Assistance Bed Mobility: Supine to Sit     Supine to sit: Supervision;HOB elevated     General bed mobility comments: mild increased effort to perform on own  Transfers Overall transfer level: Needs assistance Equipment used: Rolling walker (2 wheeled) Transfers: Sit to/from UGI CorporationStand;Stand Pivot Transfers Sit to Stand: Min guard Stand pivot transfers: Min guard(bed to recliner)       General transfer comment: initial vc's  for technique to stand from bed and from recliner (UE/LE placement and scooting to edge of sitting surface cueing)  Ambulation/Gait Ambulation/Gait assistance: Min guard Gait Distance (Feet): 60 Feet Assistive device: Rolling walker (2 wheeled)   Gait velocity: decreased   General Gait Details: partial step through gait pattern; decreased stance time R LE; initial vc's for walker use and positioning within walker; steady  Careers information officertairs            Wheelchair Mobility    Modified Rankin (Stroke Patients Only)       Balance Overall balance assessment: Needs assistance Sitting-balance support: No upper extremity supported Sitting balance-Leahy Scale: Good Sitting balance - Comments: steady sitting reaching within BOS   Standing balance support: No upper extremity supported Standing balance-Leahy Scale: Good Standing balance comment: steady standing reaching within BOS                             Pertinent Vitals/Pain Pain Assessment: 0-10 Pain Score: 1  Pain Location: R knee Pain Descriptors / Indicators: Sore Pain Intervention(s): Limited activity within patient's tolerance;Monitored during session;Premedicated before session;Repositioned;Other (comment)(polar care applied and activated)  Vitals (HR and O2 on room air) stable and WFL throughout treatment session.    Home Living Family/patient expects to be discharged to:: Private residence Living Arrangements: Spouse/significant other Available Help at Discharge: Family Type of Home: House Home Access: Stairs to enter Entrance Stairs-Rails: None Entrance Stairs-Number of Steps: 1 Home Layout: Two level;Able to live on main level with bedroom/bathroom Home Equipment: Gilmer MorCane - single point;Shower seat - built  in      Prior Function Level of Independence: Independent               Hand Dominance        Extremity/Trunk Assessment   Upper Extremity Assessment Upper Extremity Assessment: Overall WFL  for tasks assessed    Lower Extremity Assessment Lower Extremity Assessment: RLE deficits/detail;LLE deficits/detail RLE Deficits / Details: able to perform R LE SLR x10 reps independently RLE: Unable to fully assess due to pain LLE Deficits / Details: strength and ROM WFL    Cervical / Trunk Assessment Cervical / Trunk Assessment: Normal  Communication   Communication: No difficulties  Cognition Arousal/Alertness: Awake/alert Behavior During Therapy: WFL for tasks assessed/performed Overall Cognitive Status: Within Functional Limits for tasks assessed                                        General Comments General comments (skin integrity, edema, etc.): R knee polar care and dressings in place.  Nursing cleared pt for participation in physical therapy.  Pt agreeable to PT session.    Exercises Total Joint Exercises Ankle Circles/Pumps: AROM;Strengthening;Both;10 reps;Supine Quad Sets: AROM;Strengthening;Both;10 reps;Supine Short Arc Quad: AROM;Strengthening;Right;10 reps;Supine Heel Slides: AAROM;Strengthening;Right;10 reps;Supine Hip ABduction/ADduction: AROM;Strengthening;Right;10 reps;Supine Straight Leg Raises: AROM;Strengthening;Right;10 reps;Supine Goniometric ROM: R knee extension AAROM 5 degrees short of neutral semi-supine in bed; R knee flexion 85 degrees AROM sitting edge of chair   Assessment/Plan    PT Assessment Patient needs continued PT services  PT Problem List Decreased strength;Decreased range of motion;Decreased activity tolerance;Decreased balance;Decreased mobility;Decreased knowledge of use of DME;Decreased knowledge of precautions;Pain;Decreased skin integrity       PT Treatment Interventions DME instruction;Gait training;Stair training;Functional mobility training;Therapeutic activities;Therapeutic exercise;Balance training;Patient/family education    PT Goals (Current goals can be found in the Care Plan section)  Acute Rehab PT  Goals Patient Stated Goal: to go home PT Goal Formulation: With patient Time For Goal Achievement: 11/17/18 Potential to Achieve Goals: Good    Frequency BID   Barriers to discharge        Co-evaluation               AM-PAC PT "6 Clicks" Mobility  Outcome Measure Help needed turning from your back to your side while in a flat bed without using bedrails?: None Help needed moving from lying on your back to sitting on the side of a flat bed without using bedrails?: A Little Help needed moving to and from a bed to a chair (including a wheelchair)?: A Little Help needed standing up from a chair using your arms (e.g., wheelchair or bedside chair)?: A Little Help needed to walk in hospital room?: A Little Help needed climbing 3-5 steps with a railing? : A Little 6 Click Score: 19    End of Session Equipment Utilized During Treatment: Gait belt Activity Tolerance: Patient tolerated treatment well Patient left: in chair;with call bell/phone within reach;with chair alarm set;with SCD's reapplied;Other (comment)(B heels elevated via towel rolls; polar care in place and activated) Nurse Communication: Mobility status;Precautions;Weight bearing status;Other (comment)(Pt's pain status) PT Visit Diagnosis: Other abnormalities of gait and mobility (R26.89);Muscle weakness (generalized) (M62.81);Difficulty in walking, not elsewhere classified (R26.2);Pain Pain - Right/Left: Right Pain - part of body: Knee    Time: 1610-9604 PT Time Calculation (min) (ACUTE ONLY): 38 min   Charges:   PT Evaluation $PT Eval Low Complexity: 1 Low PT  Treatments $Gait Training: 8-22 mins $Therapeutic Exercise: 8-22 mins       Ansen Sayegh HauschHendricks Limesild, PT 11/03/18, 10:08 AM [phone]

## 2018-11-03 NOTE — [place] FL2 (Signed)
[place] MEDICAID FL2 LEVEL OF CARE SCREENING TOOL     IDENTIFICATION  Patient Name: Jodi Day Birthdate: Nov 21, 1945 Sex: female Admission Date (Current Location): 11/02/2018  [place]   Facility and Address:  [facility], [address], [place], [place] [address]      Provider Number: [id number]  Attending Physician Name and Address:  Donato HeinzHooten, James P, MD  Relative Name and Phone Number:       Current Level of Care: Hospital Recommended Level of Care: Skilled Nursing Facility Prior Approval Number:    Date Approved/Denied:   PASRR Number: [id number])  Discharge Plan: SNF    Current Diagnoses: Patient Active Problem List   Diagnosis Date Noted  . Total knee replacement status 11/02/2018  . Primary osteoarthritis of right knee 09/11/2018  . Polyarthralgia 10/20/2017  . Joint stiffness 10/20/2017  . Grief counseling 01/15/2016  . Anxiety 07/15/2015  . Psoriasis 07/15/2015  . Blood glucose elevated 07/15/2015  . Hypertriglyceridemia 07/15/2015  . Adult hypothyroidism 07/15/2015  . Hemorrhoids, internal 07/15/2015  . Contusion of knee 07/15/2015  . Symptomatic menopausal or female climacteric states 07/15/2015  . Avitaminosis D 07/15/2015  . Climacteric 07/15/2015  . Disease of skin and subcutaneous tissue 10/24/2012  . Big thyroid 07/04/2007  . Urge incontinence 07/04/2007  . Goiter, non-toxic 07/04/2007    Orientation RESPIRATION BLADDER Height & Weight     Self, Time, Situation, Place  Normal Continent Weight: 191 lb 12.8 oz (87 kg) Height:  5\' 9"  (175.3 cm)  BEHAVIORAL SYMPTOMS/MOOD NEUROLOGICAL BOWEL NUTRITION STATUS      Continent Diet(Diet: Carb Modified. )  AMBULATORY STATUS COMMUNICATION OF NEEDS Skin   Extensive Assist Verbally Surgical wounds(Incision: Right Knee. )                       Personal Care Assistance Level of Assistance  Bathing, Feeding, Dressing Bathing  Assistance: Limited assistance Feeding assistance: Independent Dressing Assistance: Limited assistance     Functional Limitations Info  Sight, Hearing, Speech Sight Info: Adequate Hearing Info: Adequate Speech Info: Adequate    SPECIAL CARE FACTORS FREQUENCY  PT (By licensed PT), OT (By licensed OT)     PT Frequency: (5) OT Frequency: (5)            Contractures      Additional Factors Info  Code Status, Allergies Code Status Info: (Full Code. ) Allergies Info: (Penicillins)           Current Medications (11/03/2018):  This is the current hospital active medication list Current Facility-Administered Medications  Medication Dose Route Frequency Provider Last Rate Last Dose  . 0.9 %  sodium chloride infusion   Intravenous Continuous Hooten, Illene LabradorJames P, MD 100 mL/hr at 11/03/18 0957    . acetaminophen (OFIRMEV) IV 1,000 mg  1,000 mg Intravenous Q6H Hooten, Illene LabradorJames P, MD 400 mL/hr at 11/03/18 0712 1,000 mg at 11/03/18 [id number]  . acetaminophen (TYLENOL) tablet 325-650 mg  325-650 mg Oral Q6H PRN Hooten, Illene LabradorJames P, MD      . alum & mag hydroxide-simeth (MAALOX/MYLANTA) 200-200-20 MG/5ML suspension 30 mL  30 mL Oral Q4H PRN Hooten, Illene LabradorJames P, MD      . bisacodyl (DULCOLAX) suppository 10 mg  10 mg Rectal Daily PRN Hooten, Illene LabradorJames P, MD      . celecoxib (CELEBREX) capsule 200 mg  200 mg Oral BID Donato HeinzHooten, James P, MD   200 mg at 11/03/18 0828  . clindamycin (  CLEOCIN) IVPB 600 mg  600 mg Intravenous Q6H Hooten, Illene LabradorJames P, MD 100 mL/hr at 11/03/18 0455 600 mg at 11/03/18 0455  . diphenhydrAMINE (BENADRYL) 12.5 MG/5ML elixir 12.5-25 mg  12.5-25 mg Oral Q4H PRN Hooten, Illene LabradorJames P, MD      . enoxaparin (LOVENOX) injection 30 mg  30 mg Subcutaneous Q12H Hooten, Illene LabradorJames P, MD   30 mg at 11/03/18 0829  . ferrous sulfate tablet 325 mg  325 mg Oral BID WC Donato HeinzHooten, James P, MD   325 mg at 11/03/18 [id number]  . gabapentin (NEURONTIN) capsule 300 mg  300 mg Oral QHS Hooten, Illene LabradorJames P, MD   300 mg at 11/02/18 2203  .  HYDROmorphone (DILAUDID) injection 0.5-1 mg  0.5-1 mg Intravenous Q4H PRN Hooten, Illene LabradorJames P, MD      . insulin aspart (novoLOG) injection 0-15 Units  0-15 Units Subcutaneous TID WC Hooten, Illene LabradorJames P, MD      . levothyroxine (SYNTHROID, LEVOTHROID) tablet 125 mcg  125 mcg Oral Q0600 Donato HeinzHooten, James P, MD   125 mcg at 11/03/18 0709  . liothyronine (CYTOMEL) tablet 5 mcg  5 mcg Oral Daily Hooten, Illene LabradorJames P, MD   5 mcg at 11/03/18 0829  . magnesium hydroxide (MILK OF MAGNESIA) suspension 30 mL  30 mL Oral Daily Hooten, Illene LabradorJames P, MD   30 mL at 11/03/18 0827  . menthol-cetylpyridinium (CEPACOL) lozenge 3 mg  1 lozenge Oral PRN Hooten, Illene LabradorJames P, MD       Or  . phenol (CHLORASEPTIC) mouth spray 1 spray  1 spray Mouth/Throat PRN Hooten, Illene LabradorJames P, MD      . metFORMIN (GLUCOPHAGE-XR) 24 hr tablet 500 mg  500 mg Oral QAC breakfast Hooten, Illene LabradorJames P, MD   500 mg at 11/03/18 0829  . metoCLOPramide (REGLAN) tablet 5-10 mg  5-10 mg Oral Q8H PRN Hooten, Illene LabradorJames P, MD       Or  . metoCLOPramide (REGLAN) injection 5-10 mg  5-10 mg Intravenous Q8H PRN Hooten, Illene LabradorJames P, MD      . metoCLOPramide (REGLAN) tablet 10 mg  10 mg Oral TID AC & HS Hooten, Illene LabradorJames P, MD   10 mg at 11/03/18 0829  . ondansetron (ZOFRAN) tablet 4 mg  4 mg Oral Q6H PRN Hooten, Illene LabradorJames P, MD       Or  . ondansetron (ZOFRAN) injection 4 mg  4 mg Intravenous Q6H PRN Hooten, Illene LabradorJames P, MD      . oxyCODONE (Oxy IR/ROXICODONE) immediate release tablet 10 mg  10 mg Oral Q4H PRN Hooten, Illene LabradorJames P, MD      . oxyCODONE (Oxy IR/ROXICODONE) immediate release tablet 5 mg  5 mg Oral Q4H PRN Donato HeinzHooten, James P, MD   5 mg at 11/03/18 0828  . pantoprazole (PROTONIX) EC tablet 40 mg  40 mg Oral BID Donato HeinzHooten, James P, MD   40 mg at 11/03/18 0828  . progesterone (PROMETRIUM) capsule 200 mg  200 mg Oral Daily Hooten, Illene LabradorJames P, MD      . senna-docusate (Senokot-S) tablet 1 tablet  1 tablet Oral BID Donato HeinzHooten, James P, MD   1 tablet at 11/03/18 [phone]  . sodium phosphate (FLEET) 7-19 GM/118ML enema 1  enema  1 enema Rectal Once PRN Hooten, Illene LabradorJames P, MD      . traMADol Janean Sark(ULTRAM) tablet 50-100 mg  50-100 mg Oral Q4H PRN Donato HeinzHooten, James P, MD   100 mg at 11/03/18 0452  . Vitamin D (Cholecalciferol) CAPS 250 mcg  250 mcg Oral Daily Hooten,  Illene Labrador, MD      . Melene Muller ON 11/09/2018] vitamin E capsule 400 Units  400 Units Oral Weekly Hooten, Illene Labrador, MD         Discharge Medications: Please see discharge summary for a list of discharge medications.  Relevant Imaging Results:  Relevant Lab Results:   Additional Information (SSN: [id number])  Juvia Aerts, Darleen Crocker, LCSW

## 2018-11-03 NOTE — Care Management Note (Signed)
Case Management Note  Patient Details  Name: Jodi Day MRN: [id number] Date of Birth: January 06, 1946  Subjective/Objective:                   RNCM met with patient to discuss discharge planning. She plans to return to home with her husband [name]. She agrees to home health services with [facility] per surgeon's preference as she has no preference.  She will need a rolling walker which has been faxed to [facility] for delivery to this hospital before noon tomorrow ([facility] is out [facility] stock and unsure what time truck will deliver tomorrow).  Her Lovenox has been e-scribed  [phone] and her cost is $54.85- she agrees. Action/Plan: Home health agency list provided based on 3-5 star ratings at CMS.gov making patient aware that [facility] is partners [facility]. Referral to [facility] with [facility].  Fax confirmed receipt to [employer] to 1200P on 11/03/18.  Expected Discharge Date:                  Expected Discharge Plan:     In-House Referral:     Discharge planning Services  CM Consult  Post Acute Care Choice:  Home Health, Durable Medical Equipment Choice offered to:  Patient  DME Arranged:  Walker rolling DME Agency:  [facility]  HH Arranged:  PT [place] Agency:  [facility] [employer]  Status of Service:  In process, will continue to follow  If discussed at Long Length of Stay Meetings, dates discussed:    Additional Comments:  Marshell Garfinkel, RN 11/03/2018, 11:56 AM

## 2018-11-03 NOTE — Discharge Summary (Signed)
Physician Discharge Summary  Patient ID: Jodi Day Arviso MRN: [id number] DOB/AGE: [birth year] 72 y.o.  Admit date: 11/02/2018 Discharge date: 11/04/2018  Admission Diagnoses:  PRIMARY OSTEOARTHRITIS OF RIGHT KNEE   Discharge Diagnoses: Patient Active Problem List   Diagnosis Date Noted  . Total knee replacement status 11/02/2018  . Primary osteoarthritis of right knee 09/11/2018  . Polyarthralgia 10/20/2017  . Joint stiffness 10/20/2017  . Grief counseling 01/15/2016  . Anxiety 07/15/2015  . Psoriasis 07/15/2015  . Blood glucose elevated 07/15/2015  . Hypertriglyceridemia 07/15/2015  . Adult hypothyroidism 07/15/2015  . Hemorrhoids, internal 07/15/2015  . Contusion of knee 07/15/2015  . Symptomatic menopausal or female climacteric states 07/15/2015  . Avitaminosis D 07/15/2015  . Climacteric 07/15/2015  . Disease of skin and subcutaneous tissue 10/24/2012  . Big thyroid 07/04/2007  . Urge incontinence 07/04/2007  . Goiter, non-toxic 07/04/2007    Past Medical History:  Diagnosis Date  . Anxiety   . Diabetes mellitus without complication (HCC)   . Hypothyroidism   . Thyroid disease      Transfusion: No transfusions during this admission   Consultants (if any):   Discharged Condition: Improved  Hospital Course: Jodi Day Ramires is an 72 y.o. female who was admitted 11/02/2018 with a diagnosis of degenerative arthrosis right knee and went to the operating room on 11/02/2018 and underwent the above named procedures.    Surgeries:Procedure(s): COMPUTER ASSISTED TOTAL KNEE ARTHROPLASTY on 11/02/2018  PRE-OPERATIVE DIAGNOSIS: Degenerative arthrosis of the right knee, primary  POST-OPERATIVE DIAGNOSIS:  Same  PROCEDURE:  Right total knee arthroplasty using computer-assisted navigation  SURGEON:  Jena GaussJames P Hooten, Jr. M.D.  ASSISTANT:  Van ClinesJon Wolfe, PA (present and scrubbed throughout the case, critical for assistance with exposure, retraction, instrumentation, and  closure)  ANESTHESIA: spinal  ESTIMATED BLOOD LOSS: 50 mL  FLUIDS REPLACED: 1100 mL of crystalloid  TOURNIQUET TIME: 93 minutes  DRAINS: 2 medium Hemovac drains  SOFT TISSUE RELEASES: Anterior cruciate ligament, posterior cruciate ligament, deep medial collateral ligament, patellofemoral ligament  IMPLANTS UTILIZED: DePuy Attune size 5 posterior stabilized femoral component (cemented), size 5 rotating platform tibial component (cemented), 38 mm medialized dome patella (cemented), and a 5 mm stabilized rotating platform polyethylene insert.  INDICATIONS FOR SURGERY: Jodi Day Mecham is a 72 y.o. year old female with a long history of progressive knee pain. X-rays demonstrated severe degenerative changes in tricompartmental fashion. The patient had not seen any significant improvement despite conservative nonsurgical intervention. After discussion of the risks and benefits of surgical intervention, the patient expressed understanding of the risks benefits and agree with plans for total knee arthroplasty.   The risks, benefits, and alternatives were discussed at length including but not limited to the risks of infection, bleeding, nerve injury, stiffness, blood clots, the need for revision surgery, cardiopulmonary complications, among others, and they were willing to proceed.  Patient tolerated the surgery well. No complications .Patient was taken to PACU where she was stabilized and then transferred to the orthopedic floor.  Patient started on Lovenox 30 mg q 12 hrs. Foot pumps applied bilaterally at 80 mm hgb. Heels elevated off bed with rolled towels. No evidence of DVT. Calves non tender. Negative Homan. Physical therapy started on day #1 for gait training and transfer with OT starting on  day #1 for ADL and assisted devices. Patient has done well with therapy. Ambulated greater than 200 feet upon being discharged.  Was able to ascend and descend 4 steps safely and  independently  Patient's IV And  Foley were discontinued on day #1 with Hemovac being discontinued on day #2. Dressing was changed on day 2 prior to patient being discharged   She was given perioperative antibiotics:  Anti-infectives (From admission, onward)   Start     Dose/Rate Route Frequency Ordered Stop   11/02/18 2200  clindamycin (CLEOCIN) IVPB 600 mg     600 mg 100 mL/hr over 30 Minutes Intravenous Every 6 hours 11/02/18 2049 11/03/18 1658   11/02/18 1636  gentamicin (GARAMYCIN) 80 mg in sodium chloride 0.9 % 500 mL irrigation  Status:  Discontinued       As needed 11/02/18 1636 11/02/18 1848   11/02/18 1332  clindamycin (CLEOCIN) 900 MG/50ML IVPB    Note to Pharmacy:  Lorrene ReidJackson, Pamela   : cabinet override      11/02/18 1332 11/02/18 1550   11/02/18 0600  clindamycin (CLEOCIN) IVPB 900 mg  Status:  Discontinued     900 mg 100 mL/hr over 30 Minutes Intravenous On call to O.R. 11/01/18 2319 11/02/18 1352    .  She was fitted with AV 1 compression foot pump devices, instructed on heel pumps, early ambulation, and fitted with TED stockings bilaterally for DVT prophylaxis.  She benefited maximally from the hospital stay and there were no complications.    Recent vital signs:  Vitals:   11/03/18 1635 11/03/18 2254  BP: 112/69 128/61  Pulse: (!) 58 66  Resp: 18 20  Temp: (!) 97.5 F (36.4 C) 98.7 F (37.1 C)  SpO2: 100% 97%    Recent laboratory studies:  Lab Results  Component Value Date   HGB 14.2 10/19/2018   HGB 13.5 03/10/2018   HGB 13.3 04/30/2016   Lab Results  Component Value Date   WBC 7.4 10/19/2018   PLT 298 10/19/2018   Lab Results  Component Value Date   INR 0.88 10/19/2018   Lab Results  Component Value Date   NA 139 10/19/2018   K 3.6 10/19/2018   CL 104 10/19/2018   CO2 27 10/19/2018   BUN 12 10/19/2018   CREATININE 0.62 10/19/2018   GLUCOSE 112 (H) 10/19/2018    Discharge Medications:   Allergies as of 11/04/2018      Reactions    Penicillins Other (See Comments)   Unknown, per pt's mother. Has patient had a PCN reaction causing immediate rash, facial/tongue/throat swelling, SOB or lightheadedness with hypotension: Unknown Has patient had a PCN reaction causing severe rash involving mucus membranes or skin necrosis: Unknown Has patient had a PCN reaction that required hospitalization: No Has patient had a PCN reaction occurring within the last 10 years: No If all of the above answers are "NO", then may proceed with Cephalosporin use.      Medication List    STOP taking these medications   naproxen sodium 220 MG tablet Commonly known as:  ALEVE   TURMERIC CURCUMIN PO     TAKE these medications   acetaminophen 500 MG tablet Commonly known as:  TYLENOL Take 500 mg by mouth every 6 (six) hours as needed for moderate pain.   b complex vitamins tablet Take 1 tablet by mouth daily.   BIOFREEZE EX Apply 1 application topically daily as needed (pain).   DHEA 10 MG Tabs Take 10 mg by mouth daily.   enoxaparin 40 MG/0.4ML injection Commonly known as:  LOVENOX Inject 0.4 mLs (40 mg total) into the skin daily for 14 days. Start taking on:  November 05, 2018   HRT Base Crea  by Does not apply route.   liothyronine 5 MCG tablet Commonly known as:  CYTOMEL Take 5 mcg by mouth daily.   metFORMIN 500 MG 24 hr tablet Commonly known as:  GLUCOPHAGE-XR Take 1 tablet by mouth daily before breakfast.   OVER THE COUNTER MEDICATION Take 12.5 mg by mouth daily. Iodine Complex   oxyCODONE 5 MG immediate release tablet Commonly known as:  Oxy IR/ROXICODONE Take 1 tablet (5 mg total) by mouth every 4 (four) hours as needed for moderate pain (pain score 4-6).   PHOSPHATIDYLSERINE PO Take 300 mg by mouth daily.   PRESCRIPTION MEDICATION Place 1 application rectally every 4 (four) months. Hormone replacement therapy pellet inserted rectally every 4 months. Dosing depends on blood work   progesterone 200 MG  capsule Commonly known as:  PROMETRIUM Take 200 mg by mouth daily.   SYNTHROID 125 MCG tablet Generic drug:  levothyroxine Take 125 mcg by mouth daily before breakfast.   traMADol 50 MG tablet Commonly known as:  ULTRAM Take 1-2 tablets (50-100 mg total) by mouth every 6 (six) hours as needed for moderate pain.   VITAMIN D (CHOLECALCIFEROL) PO Take 250 mcg by mouth daily.   vitamin E 400 UNIT capsule Take 400 Units by mouth once a week.   VITAMIN K PO Take 30.5 mg by mouth daily.            Durable Medical Equipment  (From admission, onward)         Start     Ordered   11/02/18 2050  DME Walker rolling  Once    Question:  Patient needs a walker to treat with the following condition  Answer:  Total knee replacement status   11/02/18 2049   11/02/18 2050  DME Bedside commode  Once    Question:  Patient needs a bedside commode to treat with the following condition  Answer:  Total knee replacement status   11/02/18 2049          Diagnostic Studies: Dg Knee Right Port  Result Date: 11/02/2018 CLINICAL DATA:  Postoperative evaluation. EXAM: PORTABLE RIGHT KNEE - 1-2 VIEW COMPARISON:  None. FINDINGS: Status post total knee arthroplasty with intact well seated femoral and tibial components, expected appearance of the resurfaced patella. No acute fracture deformity. No dislocation. No destructive bony lesions. Subcutaneous emphysema consistent with recent surgery with overlying skin staples. Surgical drain in place. IMPRESSION: Status post total knee arthroplasty with expected postoperative appearance, surgical drain in place. Electronically Signed   By: Awilda Metro M.D.   On: 11/02/2018 19:53    Disposition: Discharge disposition: 01-Home or Self Care       Discharge Instructions    Increase activity slowly   Complete by:  As directed       Follow-up Information    Anson [place], PA-C On 11/18/2018.   Specialty:  Physician Assistant Why:  at  3:15am Contact information: [address] [place] [address] [phone]        Donato Heinz, MD On 12/15/2018.   Specialty:  Orthopedic Surgery Why:  at 11:15am Contact information: [address] [facility] [place] [address] [phone]            Signed: Lenard Forth, Jahkai Yandell 11/04/2018, 7:19 AM

## 2018-11-03 NOTE — Progress Notes (Signed)
Physical Therapy Treatment Patient Details Name: Jodi Day J Alonso MRN: [id number] DOB: 28-Oct-1946 Today's Date: 11/03/2018    History of Present Illness Pt is a 72 y.o. female s/p R TKA secondary degenerative arthrosis 11/02/18.  PMH includes DM and thyroid disease.    PT Comments    Pt reporting 1/10 R knee pain beginning of session and 2/10 end of session. Able to increase ambulation distance to 180 feet with RW CGA.  Overall appearing steady and strong with ambulation and functional mobility during session.  Tolerated LE ex's well.  Will continue to progress pt with strengthening, knee ROM, and progressive ambulation distance per pt tolerance.   Follow Up Recommendations  Home health PT     Equipment Recommendations  Rolling walker with 5" wheels    Recommendations for Other Services       Precautions / Restrictions Precautions Precautions: Knee;Fall Precaution Booklet Issued: Yes (comment) Required Braces or Orthoses: Knee Immobilizer - Right Knee Immobilizer - Right: Discontinue once straight leg raise with < 10 degree lag Restrictions Weight Bearing Restrictions: Yes RLE Weight Bearing: Weight bearing as tolerated    Mobility  Bed Mobility Overal bed mobility: Needs Assistance Bed Mobility: Sit to Supine       Sit to supine: Supervision;HOB elevated   General bed mobility comments: mild increased effort to perform on own  Transfers Overall transfer level: Needs assistance Equipment used: Rolling walker (2 wheeled) Transfers: Sit to/from Stand Sit to Stand: Min guard         General transfer comment: strong stand from recliner and controlled descent sitting onto bed; no cueing for technique required  Ambulation/Gait Ambulation/Gait assistance: Min guard Gait Distance (Feet): 180 Feet Assistive device: Rolling walker (2 wheeled)   Gait velocity: decreased   General Gait Details: partial step through gait pattern; mild decreased stance time R LE;  steady   Social research officer, governmenttairs             Wheelchair Mobility    Modified Rankin (Stroke Patients Only)       Balance Overall balance assessment: Needs assistance Sitting-balance support: No upper extremity supported Sitting balance-Leahy Scale: Normal Sitting balance - Comments: steady sitting reaching outside BOS   Standing balance support: No upper extremity supported Standing balance-Leahy Scale: Good Standing balance comment: steady standing reaching within BOS                            Cognition Arousal/Alertness: Awake/alert Behavior During Therapy: WFL for tasks assessed/performed Overall Cognitive Status: Within Functional Limits for tasks assessed                                        Exercises Total Joint Exercises Long Arc Quad: AROM;Strengthening;Right;10 reps;Seated Knee Flexion: AROM;Strengthening;Right;10 reps;Seated General Exercises - Lower Extremity Hip Flexion/Marching: AROM;Strengthening;Both;10 reps;Seated    General Comments General comments (skin integrity, edema, etc.): R knee dressings and hemovac in place upon PT entry  Pt agreeable to PT session.  Pt reporting just waking up from nap upon PT arrival (pt in recliner); SCD's not attached and polar care not attached (nurse notified).      Pertinent Vitals/Pain Pain Assessment: 0-10 Pain Score: 2  Pain Location: R knee Pain Descriptors / Indicators: Sore Pain Intervention(s): Limited activity within patient's tolerance;Monitored during session;Premedicated before session;Repositioned;Other (comment)(polar care applied and activated)    Home Living  Prior Function            PT Goals (current goals can now be found in the care plan section) Acute Rehab PT Goals Patient Stated Goal: improve mobility PT Goal Formulation: With patient Time For Goal Achievement: 11/17/18 Potential to Achieve Goals: Good Progress towards PT goals:  Progressing toward goals    Frequency    BID      PT Plan Current plan remains appropriate    Co-evaluation              AM-PAC PT "6 Clicks" Mobility   Outcome Measure  Help needed turning from your back to your side while in a flat bed without using bedrails?: None Help needed moving from lying on your back to sitting on the side of a flat bed without using bedrails?: A Little Help needed moving to and from a bed to a chair (including a wheelchair)?: A Little Help needed standing up from a chair using your arms (e.g., wheelchair or bedside chair)?: A Little Help needed to walk in hospital room?: A Little Help needed climbing 3-5 steps with a railing? : A Little 6 Click Score: 19    End of Session Equipment Utilized During Treatment: Gait belt Activity Tolerance: Patient tolerated treatment well Patient left: in bed;with call bell/phone within reach;with bed alarm set;with SCD's reapplied;Other (comment)(R heel elevated via bone foam; L heel elevated via towel roll; polar care in place and activated) Nurse Communication: Mobility status;Precautions;Weight bearing status;Other (comment)(Pt's pain status) PT Visit Diagnosis: Other abnormalities of gait and mobility (R26.89);Muscle weakness (generalized) (M62.81);Difficulty in walking, not elsewhere classified (R26.2);Pain Pain - Right/Left: Right Pain - part of body: Knee     Time: [id number] PT Time Calculation (min) (ACUTE ONLY): 27 min  Charges:  $Therapeutic Exercise: 8-22 mins $Therapeutic Activity: 8-22 mins                    Hendricks LimesEmily Geralyn Figiel, PT 11/03/18, 3:48 PM [phone]

## 2018-11-03 NOTE — Progress Notes (Signed)
Patient dangled to the side of bed. Tolerated well

## 2018-11-03 NOTE — Anesthesia Postprocedure Evaluation (Signed)
Anesthesia Post Note  Patient: Jodi Day  Procedure(s) Performed: COMPUTER ASSISTED TOTAL KNEE ARTHROPLASTY (Right Knee)  Patient location during evaluation: Nursing Unit Anesthesia Type: Spinal Level of consciousness: oriented and awake and alert Pain management: pain level controlled Vital Signs Assessment: post-procedure vital signs reviewed and stable Respiratory status: spontaneous breathing and respiratory function stable Cardiovascular status: blood pressure returned to baseline and stable Postop Assessment: no headache, no backache, no apparent nausea or vomiting and patient able to bend at knees Anesthetic complications: no     Last Vitals:  Vitals:   11/03/18 0109 11/03/18 0419  BP: 117/74 126/73  Pulse: (!) 55 67  Resp: 19 19  Temp: (!) 35.7 C 36.4 C  SpO2: 100% 99%    Last Pain:  Vitals:   11/03/18 0551  TempSrc:   PainSc: 0-No pain                 Starling Mannsurtis,  Avigayil Ton A

## 2018-11-03 NOTE — Progress Notes (Signed)
Clinical Social Worker (CSW) received SNF consult. PT is recommending home health. RN case manager aware of above. Please reconsult if future social work needs arise. CSW signing off.   Rosita Guzzetta, LCSW [phone] 

## 2018-11-04 LAB — GLUCOSE, CAPILLARY: Glucose-Capillary: 95 mg/dL (ref 70–99)

## 2018-11-04 NOTE — Progress Notes (Signed)
Discharge instructions and prescriptions given to pt. IV removed. Pt denies pain at this time. Honeycomb dressing changed. Pt dressed and awaiting ride home with husband.

## 2018-11-04 NOTE — Care Management (Addendum)
Rolling walker and bedside commode delivered by [employer] per patient however patient did not need and I did not order bedside commode from [facility]. I will collect Bedside commode and have Christoper [facility] [phone] pick it up. RNCM [facility] with [facility] and they will pick this commode up. RNCM will tag "[facility]" and place in CM station on 1A.  Patient advised to dispute any charges with [employer].  Rosey Batheresa with [facility] notified of patient discharge to home today. No other RNCM needs.

## 2018-11-04 NOTE — Progress Notes (Signed)
   Subjective: 2 Days Post-Op Procedure(s) (LRB): COMPUTER ASSISTED TOTAL KNEE ARTHROPLASTY (Right) Patient reports pain as mild.   Patient is well, and has had no acute complaints or problems We will continue therapy today.  Dangle leg at side of bed this morning with no problems. Plan is to go Home after hospital stay. no nausea and no vomiting Patient denies any chest pains or shortness of breath. Patient slept well during the night. Voicing no complaints other than she does not like the bone foam.  Pleased with results so far  Objective: Vital signs in last 24 hours: Temp:  [97.5 F (36.4 C)-98.7 F (37.1 C)] 98.7 F (37.1 C) (12/19 2254) Pulse Rate:  [58-66] 66 (12/19 2254) Resp:  [18-20] 20 (12/19 2254) BP: (112-128)/(61-72) 128/61 (12/19 2254) SpO2:  [97 %-100 %] 97 % (12/19 2254) Heels are non tender and elevated off the bed using rolled towels Intake/Output from previous day: 12/19 0701 - 12/20 0700 In: 240 [P.O.:240] Out: 440 [Drains:440] Intake/Output this shift: No intake/output data recorded.  No results for input(s): HGB in the last 72 hours. No results for input(s): WBC, RBC, HCT, PLT in the last 72 hours. No results for input(s): NA, K, CL, CO2, BUN, CREATININE, GLUCOSE, CALCIUM in the last 72 hours. No results for input(s): LABPT, INR in the last 72 hours.  EXAM General - Patient is Alert, Appropriate and Oriented Extremity - Neurologically intact Neurovascular intact Sensation intact distally Intact pulses distally Dorsiflexion/Plantar flexion intact Compartment soft Dressing - dressing C/D/I.  Hemovac removed. Motor Function - intact, moving foot and toes well on exam.  Able to do straight leg raise on her own  Past Medical History:  Diagnosis Date  . Anxiety   . Diabetes mellitus without complication (HCC)   . Hypothyroidism   . Thyroid disease     Assessment/Plan: 2 Days Post-Op Procedure(s) (LRB): COMPUTER ASSISTED TOTAL KNEE ARTHROPLASTY  (Right) Active Problems:   Total knee replacement status  Estimated body mass index is 28.32 kg/m as calculated from the following:   Height as of this encounter: 5\' 9"  (1.753 m).   Weight as of this encounter: 87 kg.  Continue physical therapy.  Stairs this morning. Regular diet. Needs bowel movement. Discharge home today.  Labs: None DVT Prophylaxis - Lovenox, Foot Pumps and TED hose Weight-Bearing as tolerated to right leg   Dedra Skeensodd Anhar Mcdermott PA-C [facility] 11/04/2018, 7:17 AM

## 2018-11-04 NOTE — Progress Notes (Signed)
Physical Therapy Treatment Patient Details Name: Jodi Day MRN: [id number] DOB: 06/03/1946 Today's Date: 11/04/2018    History of Present Illness Pt is a 72 y.o. female s/p R TKA secondary degenerative arthrosis 11/02/18.  PMH includes DM and thyroid disease.    PT Comments    Pt modified independent with bed mobility; SBA with transfers; SBA with ambulation around nursing loop with RW; and CGA navigating stairs with UE support.  Pt steady without any loss of balance during session's activities; no knee buckling noted.  Pain 1/10 R knee beginning of session and 2-3/10 end of session.  R knee flexion ROM to 100 degrees.  Educated pt on safe car transfer technique and positioning; pt verbalizing good understanding.  Pt appears safe to discharge home when medically appropriate; nurse notified.    Follow Up Recommendations  Home health PT     Equipment Recommendations  Rolling walker with 5" wheels    Recommendations for Other Services       Precautions / Restrictions Precautions Precautions: Knee;Fall Precaution Booklet Issued: Yes (comment) Required Braces or Orthoses: Knee Immobilizer - Right Knee Immobilizer - Right: Discontinue once straight leg raise with < 10 degree lag Restrictions Weight Bearing Restrictions: Yes RLE Weight Bearing: Weight bearing as tolerated    Mobility  Bed Mobility Overal bed mobility: Modified Independent       Supine to sit: Modified independent (Device/Increase time) Sit to supine: Modified independent (Device/Increase time)   General bed mobility comments: Bed flat; use of L LE to assist R LE into bed; use of B UE's to assist L LE out of bed  Transfers Overall transfer level: Needs assistance Equipment used: Rolling walker (2 wheeled) Transfers: Sit to/from Stand Sit to Stand: Supervision Stand pivot transfers: Supervision       General transfer comment: x3 trials from bed; steady strong stand  noted  Ambulation/Gait Ambulation/Gait assistance: Supervision Gait Distance (Feet): 280 Feet Assistive device: Rolling walker (2 wheeled)   Gait velocity: mildly decreased   General Gait Details: mild decreased stance time R LE; mostly step-through gait pattern; steady ambulation with RW use   Stairs Stairs: Yes Stairs assistance: Min guard Stair Management: Two rails;With walker Number of Stairs: 5 General stair comments: ascended 1 step backward with RW; ascended 1 step forward with RW; descended 1 step (x2 trials) forward with RW use; ascended/descended 3 steps with B railings; steady and safe with all stairs navigation; initial vc's for technique and then no further cueing required   Wheelchair Mobility    Modified Rankin (Stroke Patients Only)       Balance Overall balance assessment: Needs assistance Sitting-balance support: No upper extremity supported Sitting balance-Leahy Scale: Normal Sitting balance - Comments: steady sitting reaching outside BOS   Standing balance support: No upper extremity supported Standing balance-Leahy Scale: Good Standing balance comment: steady standing reaching within BOS                            Cognition Arousal/Alertness: Awake/alert Behavior During Therapy: WFL for tasks assessed/performed Overall Cognitive Status: Within Functional Limits for tasks assessed                                        Exercises Total Joint Exercises Ankle Circles/Pumps: AROM;Strengthening;Both;10 reps;Supine Quad Sets: AROM;Strengthening;10 reps;Supine;Right Heel Slides: AAROM;Strengthening;Right;10 reps;Supine Knee Flexion: AROM;Strengthening;Right;Supine;10 reps Goniometric ROM: R  knee extension AROM 3 degrees short of neutral semi-supine in bed; R knee flexion 100 degrees AAROM sitting edge of bed    General Comments General comments (skin integrity, edema, etc.): R knee dressings in place upon PT entry.   Nursing cleared pt for participation in physical therapy.  Pt agreeable to PT session.      Pertinent Vitals/Pain Pain Assessment: 0-10 Pain Score: 3  Pain Location: R knee Pain Descriptors / Indicators: Sore Pain Intervention(s): Limited activity within patient's tolerance;Monitored during session;Premedicated before session;Repositioned;Other (comment)(polar care applied and activated)  Vitals (HR and O2 on room air) stable and WFL throughout treatment session.    Home Living                      Prior Function            PT Goals (current goals can now be found in the care plan section) Acute Rehab PT Goals Patient Stated Goal: improve mobility PT Goal Formulation: With patient Time For Goal Achievement: 11/17/18 Potential to Achieve Goals: Good Progress towards PT goals: Progressing toward goals    Frequency    BID      PT Plan Current plan remains appropriate    Co-evaluation              AM-PAC PT "6 Clicks" Mobility   Outcome Measure  Help needed turning from your back to your side while in a flat bed without using bedrails?: None Help needed moving from lying on your back to sitting on the side of a flat bed without using bedrails?: None Help needed moving to and from a bed to a chair (including a wheelchair)?: A Little Help needed standing up from a chair using your arms (e.g., wheelchair or bedside chair)?: A Little Help needed to walk in hospital room?: A Little Help needed climbing 3-5 steps with a railing? : A Little 6 Click Score: 20    End of Session Equipment Utilized During Treatment: Gait belt Activity Tolerance: Patient tolerated treatment well Patient left: in chair;with call bell/phone within reach;with chair alarm set;with SCD's reapplied;Other (comment)(B heels elevated via towel rolls; polar care in place and activated) Nurse Communication: Mobility status;Precautions;Weight bearing status PT Visit Diagnosis: Other  abnormalities of gait and mobility (R26.89);Muscle weakness (generalized) (M62.81);Difficulty in walking, not elsewhere classified (R26.2);Pain Pain - Right/Left: Right Pain - part of body: Knee     Time: [id number] PT Time Calculation (min) (ACUTE ONLY): 40 min  Charges:  $Gait Training: 8-22 mins $Therapeutic Exercise: 8-22 mins $Therapeutic Activity: 8-22 mins                    Session was performed by student PT, Lisbeth RenshawShane Courtney, and directed, overseen, and documented by this PT.  Hendricks LimesEmily Sheela Mcculley, PT 11/04/18, 9:42 AM [phone]

## 2018-11-05 DIAGNOSIS — Z7901 Long term (current) use of anticoagulants: Secondary | ICD-10-CM | POA: Diagnosis not present

## 2018-11-05 DIAGNOSIS — Z7984 Long term (current) use of oral hypoglycemic drugs: Secondary | ICD-10-CM | POA: Diagnosis not present

## 2018-11-05 DIAGNOSIS — Z96651 Presence of right artificial knee joint: Secondary | ICD-10-CM | POA: Diagnosis not present

## 2018-11-05 DIAGNOSIS — Z471 Aftercare following joint replacement surgery: Secondary | ICD-10-CM | POA: Diagnosis not present

## 2018-11-05 DIAGNOSIS — E781 Pure hyperglyceridemia: Secondary | ICD-10-CM | POA: Diagnosis not present

## 2018-11-05 DIAGNOSIS — E039 Hypothyroidism, unspecified: Secondary | ICD-10-CM | POA: Diagnosis not present

## 2018-11-05 DIAGNOSIS — E119 Type 2 diabetes mellitus without complications: Secondary | ICD-10-CM | POA: Diagnosis not present

## 2018-11-05 DIAGNOSIS — F419 Anxiety disorder, unspecified: Secondary | ICD-10-CM | POA: Diagnosis not present

## 2018-11-18 DIAGNOSIS — M6281 Muscle weakness (generalized): Secondary | ICD-10-CM | POA: Diagnosis not present

## 2018-11-18 DIAGNOSIS — Z96651 Presence of right artificial knee joint: Secondary | ICD-10-CM | POA: Diagnosis not present

## 2018-11-18 DIAGNOSIS — M25561 Pain in right knee: Secondary | ICD-10-CM | POA: Diagnosis not present

## 2018-11-18 DIAGNOSIS — M25661 Stiffness of right knee, not elsewhere classified: Secondary | ICD-10-CM | POA: Diagnosis not present

## 2018-11-21 DIAGNOSIS — Z96651 Presence of right artificial knee joint: Secondary | ICD-10-CM | POA: Diagnosis not present

## 2018-11-21 DIAGNOSIS — M25561 Pain in right knee: Secondary | ICD-10-CM | POA: Diagnosis not present

## 2018-11-23 DIAGNOSIS — Z96651 Presence of right artificial knee joint: Secondary | ICD-10-CM | POA: Diagnosis not present

## 2018-11-23 DIAGNOSIS — M25561 Pain in right knee: Secondary | ICD-10-CM | POA: Diagnosis not present

## 2018-11-25 DIAGNOSIS — M25561 Pain in right knee: Secondary | ICD-10-CM | POA: Diagnosis not present

## 2018-11-25 DIAGNOSIS — Z96651 Presence of right artificial knee joint: Secondary | ICD-10-CM | POA: Diagnosis not present

## 2018-11-28 DIAGNOSIS — M25561 Pain in right knee: Secondary | ICD-10-CM | POA: Diagnosis not present

## 2018-11-28 DIAGNOSIS — Z96651 Presence of right artificial knee joint: Secondary | ICD-10-CM | POA: Diagnosis not present

## 2018-12-02 DIAGNOSIS — M25551 Pain in right hip: Secondary | ICD-10-CM | POA: Diagnosis not present

## 2018-12-02 DIAGNOSIS — M7061 Trochanteric bursitis, right hip: Secondary | ICD-10-CM | POA: Diagnosis not present

## 2018-12-02 DIAGNOSIS — M25561 Pain in right knee: Secondary | ICD-10-CM | POA: Diagnosis not present

## 2018-12-02 DIAGNOSIS — Z96651 Presence of right artificial knee joint: Secondary | ICD-10-CM | POA: Diagnosis not present

## 2018-12-05 DIAGNOSIS — M25561 Pain in right knee: Secondary | ICD-10-CM | POA: Diagnosis not present

## 2018-12-05 DIAGNOSIS — Z96651 Presence of right artificial knee joint: Secondary | ICD-10-CM | POA: Diagnosis not present

## 2018-12-07 DIAGNOSIS — Z1231 Encounter for screening mammogram for malignant neoplasm of breast: Secondary | ICD-10-CM | POA: Diagnosis not present

## 2018-12-07 LAB — HM MAMMOGRAPHY

## 2018-12-12 DIAGNOSIS — M25561 Pain in right knee: Secondary | ICD-10-CM | POA: Diagnosis not present

## 2018-12-12 DIAGNOSIS — G8929 Other chronic pain: Secondary | ICD-10-CM | POA: Diagnosis not present

## 2018-12-15 DIAGNOSIS — M1711 Unilateral primary osteoarthritis, right knee: Secondary | ICD-10-CM | POA: Diagnosis not present

## 2018-12-19 DIAGNOSIS — E890 Postprocedural hypothyroidism: Secondary | ICD-10-CM | POA: Diagnosis not present

## 2018-12-19 LAB — TSH: TSH: 0.44 (ref 0.41–5.90)

## 2018-12-26 DIAGNOSIS — R5383 Other fatigue: Secondary | ICD-10-CM | POA: Diagnosis not present

## 2018-12-26 DIAGNOSIS — M2550 Pain in unspecified joint: Secondary | ICD-10-CM | POA: Diagnosis not present

## 2018-12-26 DIAGNOSIS — N951 Menopausal and female climacteric states: Secondary | ICD-10-CM | POA: Diagnosis not present

## 2018-12-26 DIAGNOSIS — R6882 Decreased libido: Secondary | ICD-10-CM | POA: Diagnosis not present

## 2018-12-27 DIAGNOSIS — E890 Postprocedural hypothyroidism: Secondary | ICD-10-CM | POA: Diagnosis not present

## 2018-12-27 DIAGNOSIS — R42 Dizziness and giddiness: Secondary | ICD-10-CM | POA: Diagnosis not present

## 2018-12-28 DIAGNOSIS — R6882 Decreased libido: Secondary | ICD-10-CM | POA: Diagnosis not present

## 2018-12-28 DIAGNOSIS — N951 Menopausal and female climacteric states: Secondary | ICD-10-CM | POA: Diagnosis not present

## 2018-12-28 DIAGNOSIS — Z72820 Sleep deprivation: Secondary | ICD-10-CM | POA: Diagnosis not present

## 2019-01-03 DIAGNOSIS — Z471 Aftercare following joint replacement surgery: Secondary | ICD-10-CM | POA: Diagnosis not present

## 2019-01-05 ENCOUNTER — Encounter: Payer: Self-pay | Admitting: Family Medicine

## 2019-01-05 ENCOUNTER — Ambulatory Visit (INDEPENDENT_AMBULATORY_CARE_PROVIDER_SITE_OTHER): Payer: Medicare Other | Admitting: Family Medicine

## 2019-01-05 VITALS — BP 127/79 | HR 73 | Temp 98.7°F | Wt 186.0 lb

## 2019-01-05 DIAGNOSIS — E559 Vitamin D deficiency, unspecified: Secondary | ICD-10-CM | POA: Diagnosis not present

## 2019-01-05 DIAGNOSIS — Z0000 Encounter for general adult medical examination without abnormal findings: Secondary | ICD-10-CM

## 2019-01-05 DIAGNOSIS — Z9889 Other specified postprocedural states: Secondary | ICD-10-CM | POA: Diagnosis not present

## 2019-01-05 DIAGNOSIS — E039 Hypothyroidism, unspecified: Secondary | ICD-10-CM | POA: Diagnosis not present

## 2019-01-05 DIAGNOSIS — R739 Hyperglycemia, unspecified: Secondary | ICD-10-CM | POA: Diagnosis not present

## 2019-01-05 DIAGNOSIS — Z79899 Other long term (current) drug therapy: Secondary | ICD-10-CM | POA: Diagnosis not present

## 2019-01-05 DIAGNOSIS — E781 Pure hyperglyceridemia: Secondary | ICD-10-CM | POA: Diagnosis not present

## 2019-01-05 LAB — T4, FREE
T4,Free (Direct): 1.25
Triiodothyronine,Free,Serum: 3

## 2019-01-05 MED ORDER — METFORMIN HCL ER 500 MG PO TB24: 500.0000 mg | ORAL_TABLET | Freq: Every day | ORAL | 3 refills | Status: DC

## 2019-01-05 NOTE — Assessment & Plan Note (Signed)
Recheck lipids

## 2019-01-05 NOTE — Progress Notes (Signed)
Patient: Jodi Day, Female    DOB: [birth year], 73 y.o.   MRN: [id number] Visit Date: 01/05/2019  Today's Provider: Shirlee LatchAngela Djuna Frechette, MD   Chief Complaint  Patient presents with  . Annual Exam   Subjective:    I, Presley RaddleNikki Walston, CMA, am acting as a scribe for Shirlee LatchAngela Alfreida Steffenhagen, MD.   Patient had a AWE on 09/23/2018 with [facility]   Complete Physical Jodi Pasty ArchJ Carrera is a 73 y.o. female. Jodi Day feels well. Jodi Day reports exercising 1 day per week at this time. Jodi Day reports Jodi Day is sleeping well.  Jodi Day is s/p knee replacement and doing well.  Walking more.  Jodi Day is not on a statin but would take one if needed as Jodi Day read about its benefits  Jodi Day is on Metformin for h/o prediabetes.  A1c has been well controlled. ----------------------------------------------------------------------------------------------------------------   Review of Systems  Constitutional: Negative.   HENT: Negative.   Eyes: Negative.   Respiratory: Negative.   Cardiovascular: Negative.   Gastrointestinal: Negative.   Endocrine: Negative.   Genitourinary: Negative.   Musculoskeletal: Negative.   Skin: Negative.   Allergic/Immunologic: Negative.   Neurological: Negative.   Hematological: Negative.   Psychiatric/Behavioral: Negative.     Social History   Socioeconomic History  . Marital status: Married    Spouse name: Thereasa DistanceRodney  . Number of children: 2  . Years of education: Associates  . Highest education level: Associate degree: occupational, Scientist, product/process developmenttechnical, or vocational program  Occupational History  . Occupation: Self Employeed owns a Research officer, political partyreal estate business    Comment: retired  Engineer, productionocial Needs  . Financial resource strain: Not hard at all  . Food insecurity:    Worry: Never true    Inability: Never true  . Transportation needs:    Medical: No    Non-medical: No  Tobacco Use  . Smoking status: Former Smoker    Packs/day: 1.00    Years: 7.00    Pack years: 7.00    Last attempt to quit: 11/15/1977     Years since quitting: 41.1  . Smokeless tobacco: Never Used  Substance and Sexual Activity  . Alcohol use: Yes    Alcohol/week: 0.0 - 1.0 standard drinks  . Drug use: No  . Sexual activity: Yes  Lifestyle  . Physical activity:    Days per week: 3 days    Minutes per session: 60 min  . Stress: Not at all  Relationships  . Social connections:    Talks on phone: Patient refused    Gets together: Patient refused    Attends religious service: Patient refused    Active member of club or organization: Patient refused    Attends meetings of clubs or organizations: Patient refused    Relationship status: Patient refused  . Intimate partner violence:    Fear of current or ex partner: Patient refused    Emotionally abused: Patient refused    Physically abused: Patient refused    Forced sexual activity: Patient refused  Other Topics Concern  . Not on file  Social History Narrative  . Not on file    Past Medical History:  Diagnosis Date  . Anxiety   . Diabetes mellitus without complication (HCC)   . Hypothyroidism   . Thyroid disease      Patient Active Problem List   Diagnosis Date Noted  . Total knee replacement status 11/02/2018  . Primary osteoarthritis of right knee 09/11/2018  . Polyarthralgia 10/20/2017  . Joint stiffness 10/20/2017  . Grief  counseling 01/15/2016  . Anxiety 07/15/2015  . Psoriasis 07/15/2015  . Blood glucose elevated 07/15/2015  . Hypertriglyceridemia 07/15/2015  . Adult hypothyroidism 07/15/2015  . Hemorrhoids, internal 07/15/2015  . Contusion of knee 07/15/2015  . Symptomatic menopausal or female climacteric states 07/15/2015  . Avitaminosis D 07/15/2015  . Climacteric 07/15/2015  . Disease of skin and subcutaneous tissue 10/24/2012  . Big thyroid 07/04/2007  . Urge incontinence 07/04/2007  . Goiter, non-toxic 07/04/2007    Past Surgical History:  Procedure Laterality Date  . ABDOMINAL HYSTERECTOMY  1992   fibroids. ovaries intact  .  CHOLECYSTECTOMY  1996  . EYE SURGERY Bilateral 2014   cataract surgery  . KNEE ARTHROPLASTY Right 11/02/2018   Procedure: COMPUTER ASSISTED TOTAL KNEE ARTHROPLASTY;  Surgeon: Donato Heinz, MD;  Location: [facility] ORS;  Service: Orthopedics;  Laterality: Right;  . thyroidectomy  1973  . tonsillectomy and adenoidectomy  1973    Her family history includes COPD in her father; Colon polyps in her brother; Diabetes in her mother; Hypothyroidism in her mother; Parkinson's disease in her brother; Pneumonia in her brother; Prostate cancer in her brother and brother; Stroke in her mother.   Current Outpatient Medications:  .  Hormone Cream Base (HRT BASE) CREA, by Does not apply route., Disp: , Rfl:  .  liothyronine (CYTOMEL) 5 MCG tablet, Take 5 mcg by mouth daily., Disp: , Rfl:  .  Menthol, Topical Analgesic, (BIOFREEZE EX), Apply 1 application topically daily as needed (pain). , Disp: , Rfl:  .  metFORMIN (GLUCOPHAGE-XR) 500 MG 24 hr tablet, Take 1 tablet (500 mg total) by mouth daily before breakfast., Disp: 90 tablet, Rfl: 3 .  OVER THE COUNTER MEDICATION, Take 12.5 mg by mouth daily. Iodine Complex, Disp: , Rfl:  .  PHOSPHATIDYLSERINE PO, Take 300 mg by mouth daily., Disp: , Rfl:  .  PRESCRIPTION MEDICATION, Place 1 application rectally every 4 (four) months. Hormone replacement therapy pellet inserted rectally every 4 months. Dosing depends on blood work, Disp: , Rfl:  .  progesterone (PROMETRIUM) 200 MG capsule, Take 200 mg by mouth daily. , Disp: , Rfl:  .  SYNTHROID 125 MCG tablet, Take 125 mcg by mouth daily before breakfast. , Disp: , Rfl: 10 .  TURMERIC PO, Take 750 mg by mouth., Disp: , Rfl:  .  VITAMIN D, CHOLECALCIFEROL, PO, Take 250 mcg by mouth daily. , Disp: , Rfl:  .  VITAMIN K PO, Take 30.5 mg by mouth daily. , Disp: , Rfl:  .  acetaminophen (TYLENOL) 500 MG tablet, Take 500 mg by mouth every 6 (six) hours as needed for moderate pain., Disp: , Rfl:  .  b complex vitamins tablet,  Take 1 tablet by mouth daily., Disp: , Rfl:  .  DHEA 10 MG TABS, Take 10 mg by mouth daily., Disp: , Rfl:  .  vitamin E 400 UNIT capsule, Take 400 Units by mouth once a week. , Disp: , Rfl:   Patient Care Team: Erasmo Downer, MD as PCP - General (Family Medicine) Tyrone Schimke, MD as Consulting Physician (Ophthalmology) Vernie Murders, MD (Otolaryngology) Ernest Pine, Illene Labrador, MD (Orthopedic Surgery)     Objective:    Vitals: BP 127/79 (BP Location: Left Arm, Patient Position: Sitting, Cuff Size: Large)   Pulse 73   Temp 98.7 F (37.1 C) (Oral)   Wt 186 lb (84.4 kg)   SpO2 99%   BMI 27.47 kg/m   Physical Exam Vitals signs reviewed.  Constitutional:  General: Jodi Day is not in acute distress.    Appearance: Normal appearance. Jodi Day is well-developed. Jodi Day is not diaphoretic.  HENT:     Head: Normocephalic and atraumatic.     Right Ear: Tympanic membrane, ear canal and external ear normal.     Left Ear: Tympanic membrane, ear canal and external ear normal.     Nose: Nose normal. No congestion.     Mouth/Throat:     Mouth: Mucous membranes are moist.     Pharynx: Oropharynx is clear. No oropharyngeal exudate.  Eyes:     General: No scleral icterus.    Conjunctiva/sclera: Conjunctivae normal.     Pupils: Pupils are equal, round, and reactive to light.  Neck:     Musculoskeletal: Neck supple.     Thyroid: No thyromegaly.  Cardiovascular:     Rate and Rhythm: Normal rate and regular rhythm.     Pulses: Normal pulses.     Heart sounds: Normal heart sounds. No murmur.  Pulmonary:     Effort: Pulmonary effort is normal. No respiratory distress.     Breath sounds: Normal breath sounds. No wheezing or rales.  Abdominal:     General: Bowel sounds are normal. There is no distension.     Palpations: Abdomen is soft.     Tenderness: There is no abdominal tenderness. There is no guarding or rebound.  Musculoskeletal:     Right lower leg: No edema.     Left lower leg: No  edema.  Lymphadenopathy:     Cervical: No cervical adenopathy.  Skin:    General: Skin is warm and dry.     Capillary Refill: Capillary refill takes less than 2 seconds.     Findings: No rash.  Neurological:     Mental Status: Jodi Day is alert and oriented to person, place, and time. Mental status is at baseline.  Psychiatric:        Mood and Affect: Mood normal.        Behavior: Behavior normal.        Thought Content: Thought content normal.     Activities of Daily Living In your present state of health, do you have any difficulty performing the following activities: 01/05/2019 11/02/2018  Hearing? N N  Vision? N N  Difficulty concentrating or making decisions? N N  Walking or climbing stairs? N Y  Comment - -  Dressing or bathing? N N  Doing errands, shopping? N N  Preparing Food and eating ? - -  Using the Toilet? - -  In the past six months, have you accidently leaked urine? - -  Comment - -  Do you have problems with loss of bowel control? - -  Managing your Medications? - -  Managing your Finances? - -  Housekeeping or managing your Housekeeping? - -  Some recent data might be hidden    Fall Risk Assessment Fall Risk  01/05/2019 09/23/2018 09/22/2017 04/16/2016  Falls in the past year? 0 0 No No     Depression Screen PHQ 2/9 Scores 01/05/2019 09/23/2018 09/22/2017 09/22/2017  PHQ - 2 Score 0 0 0 0  PHQ- 9 Score 1 - 2 -    6CIT Screen 09/22/2017  What Year? 0 points  What month? 0 points  What time? 0 points  Count back from 20 0 points  Months in reverse 2 points  Repeat phrase 0 points  Total Score 2      Assessment & Plan:    Annual Physical Reviewed  patient's Family Medical History Reviewed and updated list of patient's medical providers Assessment of cognitive impairment was done Assessed patient's functional ability Established a written schedule for health screening services Health Risk Assessent Completed and Reviewed  Exercise Activities and Dietary  recommendations Goals    . Increase water intake     Recommend increasing water intake to 6-8 glasses a day.        Immunization History  Administered Date(s) Administered  . Influenza-Unspecified 08/15/2018  . Pneumococcal Conjugate-13 01/08/2015  . Pneumococcal Polysaccharide-23 01/15/2016  . Td 10/20/2017  . Tdap 07/01/2007  . Zoster 01/04/2009  . Zoster Recombinat (Shingrix) 12/27/2017, 02/28/2018    Health Maintenance  Topic Date Due  . MAMMOGRAM  12/08/2019  . COLONOSCOPY  01/04/2023  . TETANUS/TDAP  10/21/2027  . INFLUENZA VACCINE  Completed  . DEXA SCAN  Completed  . Hepatitis C Screening  Completed  . PNA vac Low Risk Adult  Completed     Discussed health benefits of physical activity, and encouraged her to engage in regular exercise appropriate for her age and condition.    ------------------------------------------------------------------------------------------------------------  Problem List Items Addressed This Visit      Endocrine   Adult hypothyroidism    F/b ENT Abstracted most recent TSH        Other   Blood glucose elevated    Recheck A1c Continue metformin Not diabetic      Relevant Orders   Comprehensive metabolic panel   Hemoglobin A1c   Hypertriglyceridemia    Recheck lipids      Relevant Orders   Lipid panel   Comprehensive metabolic panel   Avitaminosis D    Recheck Vit D Continue supplement      Relevant Orders   VITAMIN D 25 Hydroxy (Vit-D Deficiency, Fractures)    Other Visit Diagnoses    Encounter for annual physical exam    -  Primary   Long-term use of high-risk medication       Relevant Orders   B12   Recent major surgery       Relevant Orders   CBC w/Diff/Platelet       Return in about 1 year (around 01/06/2020) for AWV/CPE.   The entirety of the information documented in the History of Present Illness, Review of Systems and Physical Exam were personally obtained by me. Portions of this information  were initially documented by Presley Raddle, CMA and reviewed by me for thoroughness and accuracy.    Erasmo Downer, MD, MPH [facility] 01/05/2019 10:00 AM

## 2019-01-05 NOTE — Assessment & Plan Note (Signed)
Recheck A1c Continue metformin Not diabetic

## 2019-01-05 NOTE — Assessment & Plan Note (Signed)
F/b ENT Abstracted most recent TSH

## 2019-01-05 NOTE — Patient Instructions (Signed)
Preventive Care 71 Years and Older, Female Preventive care refers to lifestyle choices and visits with your health care provider that can promote health and wellness. What does preventive care include?  A yearly physical exam. This is also called an annual well check.  Dental exams once or twice a year.  Routine eye exams. Ask your health care provider how often you should have your eyes checked.  Personal lifestyle choices, including: ? Daily care of your teeth and gums. ? Regular physical activity. ? Eating a healthy diet. ? Avoiding tobacco and drug use. ? Limiting alcohol use. ? Practicing safe sex. ? Taking low-dose aspirin every day. ? Taking vitamin and mineral supplements as recommended by your health care provider. What happens during an annual well check? The services and screenings done by your health care provider during your annual well check will depend on your age, overall health, lifestyle risk factors, and family history of disease. Counseling Your health care provider may ask you questions about your:  Alcohol use.  Tobacco use.  Drug use.  Emotional well-being.  Home and relationship well-being.  Sexual activity.  Eating habits.  History of falls.  Memory and ability to understand (cognition).  Work and work Statistician.  Reproductive health.  Screening You may have the following tests or measurements:  Height, weight, and BMI.  Blood pressure.  Lipid and cholesterol levels. These may be checked every 5 years, or more frequently if you are over 30 years old.  Skin check.  Lung cancer screening. You may have this screening every year starting at age 27 if you have a 30-pack-year history of smoking and currently smoke or have quit within the past 15 years.  Colorectal cancer screening. All adults should have this screening starting at age 33 and continuing until age 46. You will have tests every 1-10 years, depending on your results and the  type of screening test. People at increased risk should start screening at an earlier age. Screening tests may include: ? Guaiac-based fecal occult blood testing. ? Fecal immunochemical test (FIT). ? Stool DNA test. ? Virtual colonoscopy. ? Sigmoidoscopy. During this test, a flexible tube with a tiny camera (sigmoidoscope) is used to examine your rectum and lower colon. The sigmoidoscope is inserted through your anus into your rectum and lower colon. ? Colonoscopy. During this test, a long, thin, flexible tube with a tiny camera (colonoscope) is used to examine your entire colon and rectum.  Hepatitis C blood test.  Hepatitis B blood test.  Sexually transmitted disease (STD) testing.  Diabetes screening. This is done by checking your blood sugar (glucose) after you have not eaten for a while (fasting). You may have this done every 1-3 years.  Bone density scan. This is done to screen for osteoporosis. You may have this done starting at age 37.  Mammogram. This may be done every 1-2 years. Talk to your health care provider about how often you should have regular mammograms. Talk with your health care provider about your test results, treatment options, and if necessary, the need for more tests. Vaccines Your health care provider may recommend certain vaccines, such as:  Influenza vaccine. This is recommended every year.  Tetanus, diphtheria, and acellular pertussis (Tdap, Td) vaccine. You may need a Td booster every 10 years.  Varicella vaccine. You may need this if you have not been vaccinated.  Zoster vaccine. You may need this after age 38.  Measles, mumps, and rubella (MMR) vaccine. You may need at least  one dose of MMR if you were born in 1957 or later. You may also need a second dose.  Pneumococcal 13-valent conjugate (PCV13) vaccine. One dose is recommended after age 24.  Pneumococcal polysaccharide (PPSV23) vaccine. One dose is recommended after age 24.  Meningococcal  vaccine. You may need this if you have certain conditions.  Hepatitis A vaccine. You may need this if you have certain conditions or if you travel or work in places where you may be exposed to hepatitis A.  Hepatitis B vaccine. You may need this if you have certain conditions or if you travel or work in places where you may be exposed to hepatitis B.  Haemophilus influenzae type b (Hib) vaccine. You may need this if you have certain conditions. Talk to your health care provider about which screenings and vaccines you need and how often you need them. This information is not intended to replace advice given to you by your health care provider. Make sure you discuss any questions you have with your health care provider. Document Released: 11/29/2015 Document Revised: 12/23/2017 Document Reviewed: 09/03/2015 Elsevier Interactive Patient Education  2019 [employer].

## 2019-01-05 NOTE — Assessment & Plan Note (Signed)
Recheck Vit D Continue supplement 

## 2019-01-06 LAB — COMPREHENSIVE METABOLIC PANEL
ALT: 26 IU/L (ref 0–32)
AST: 21 IU/L (ref 0–40)
Albumin/Globulin Ratio: 1.7 (ref 1.2–2.2)
Albumin: 4.3 g/dL (ref 3.8–4.8)
Alkaline Phosphatase: 71 IU/L (ref 39–117)
BUN/Creatinine Ratio: 13 (ref 12–28)
BUN: 9 mg/dL (ref 8–27)
Bilirubin Total: 0.4 mg/dL (ref 0.0–1.2)
CO2: 22 mmol/L (ref 20–29)
CREATININE: 0.68 mg/dL (ref 0.57–1.00)
Calcium: 9.5 mg/dL (ref 8.7–10.3)
Chloride: 101 mmol/L (ref 96–106)
GFR calc Af Amer: 102 mL/min/{1.73_m2} (ref >59)
GFR calc non Af Amer: 89 mL/min/{1.73_m2} (ref >59)
Globulin, Total: 2.5 g/dL (ref 1.5–4.5)
Glucose: 95 mg/dL (ref 65–99)
Potassium: 4.5 mmol/L (ref 3.5–5.2)
Sodium: 137 mmol/L (ref 134–144)
Total Protein: 6.8 g/dL (ref 6.0–8.5)

## 2019-01-06 LAB — CBC WITH DIFFERENTIAL/PLATELET
Basophils Absolute: 0.1 10*3/uL (ref 0.0–0.2)
Basos: 1 %
EOS (ABSOLUTE): 0.2 10*3/uL (ref 0.0–0.4)
Eos: 3 %
Hematocrit: 41.1 % (ref 34.0–46.6)
Hemoglobin: 13.8 g/dL (ref 11.1–15.9)
IMMATURE GRANS (ABS): 0 10*3/uL (ref 0.0–0.1)
Immature Granulocytes: 0 %
Lymphocytes Absolute: 1.5 10*3/uL (ref 0.7–3.1)
Lymphs: 19 %
MCH: 29.6 pg (ref 26.6–33.0)
MCHC: 33.6 g/dL (ref 31.5–35.7)
MCV: 88 fL (ref 79–97)
Monocytes Absolute: 0.8 10*3/uL (ref 0.1–0.9)
Monocytes: 10 %
Neutrophils Absolute: 5.1 10*3/uL (ref 1.4–7.0)
Neutrophils: 67 %
Platelets: 338 10*3/uL (ref 150–450)
RBC: 4.66 x10E6/uL (ref 3.77–5.28)
RDW: 12.3 % (ref 11.7–15.4)
WBC: 7.6 10*3/uL (ref 3.4–10.8)

## 2019-01-06 LAB — HEMOGLOBIN A1C
Est. average glucose Bld gHb Est-mCnc: 105 mg/dL
Hgb A1c MFr Bld: 5.3 % (ref 4.8–5.6)

## 2019-01-06 LAB — VITAMIN B12: Vitamin B-12: 486 pg/mL (ref 232–1245)

## 2019-01-06 LAB — VITAMIN D 25 HYDROXY (VIT D DEFICIENCY, FRACTURES): VIT D 25 HYDROXY: 47.2 ng/mL (ref 30.0–100.0)

## 2019-01-06 LAB — LIPID PANEL
CHOL/HDL RATIO: 4 ratio (ref 0.0–4.4)
Cholesterol, Total: 176 mg/dL (ref 100–199)
HDL: 44 mg/dL (ref >39)
LDL Calculated: 92 mg/dL (ref 0–99)
Triglycerides: 199 mg/dL — ABNORMAL HIGH (ref 0–149)
VLDL Cholesterol Cal: 40 mg/dL (ref 5–40)

## 2019-01-23 DIAGNOSIS — J029 Acute pharyngitis, unspecified: Secondary | ICD-10-CM | POA: Diagnosis not present

## 2019-01-23 DIAGNOSIS — R0982 Postnasal drip: Secondary | ICD-10-CM | POA: Diagnosis not present

## 2019-02-15 ENCOUNTER — Ambulatory Visit (INDEPENDENT_AMBULATORY_CARE_PROVIDER_SITE_OTHER): Payer: Medicare Other | Admitting: Physician Assistant

## 2019-02-15 DIAGNOSIS — R05 Cough: Secondary | ICD-10-CM | POA: Diagnosis not present

## 2019-02-15 DIAGNOSIS — J309 Allergic rhinitis, unspecified: Secondary | ICD-10-CM

## 2019-02-15 DIAGNOSIS — R059 Cough, unspecified: Secondary | ICD-10-CM

## 2019-02-15 DIAGNOSIS — R062 Wheezing: Secondary | ICD-10-CM | POA: Diagnosis not present

## 2019-02-15 MED ORDER — LEVOCETIRIZINE DIHYDROCHLORIDE 5 MG PO TABS: 2.5000 mg | ORAL_TABLET | Freq: Every evening | ORAL | 0 refills | Status: DC

## 2019-02-15 MED ORDER — ALBUTEROL SULFATE HFA 108 (90 BASE) MCG/ACT IN AERS: 2.0000 | INHALATION_SPRAY | Freq: Four times a day (QID) | RESPIRATORY_TRACT | 0 refills | Status: DC | PRN

## 2019-02-15 NOTE — Progress Notes (Signed)
   Subjective:    Patient ID: Jodi Day, female    DOB: 29-Dec-1945, 73 y.o.   MRN: [id number]  Jodi Day is a 73 y.o. female presenting on 02/15/2019 for Cough and Wheezing   HPI   Patient went to urgent care on 01/23/2019 and thought she had strep throat. She tested negative. She was prescribed a z-pak and tessalon perles, which she did not take. Reports she is having a cough as if she is having to clear her throat. She also reports wheezing. Denies fevers, chills, aches, headaches, shortness of breath, vomiting. Reports itchy eyes. Denies runny nose. Reports occasional wheezing. Not taking allergy medication. She did take some flonase.  Social History   Tobacco Use  . Smoking status: Former Smoker    Packs/day: 1.00    Years: 7.00    Pack years: 7.00    Last attempt to quit: 11/15/1977    Years since quitting: 41.2  . Smokeless tobacco: Never Used  Substance Use Topics  . Alcohol use: Yes    Alcohol/week: 0.0 - 1.0 standard drinks  . Drug use: No    Review of Systems Per HPI unless specifically indicated above     Objective:    There were no vitals taken for this visit.  Wt Readings from Last 3 Encounters:  01/05/19 186 lb (84.4 kg)  11/02/18 191 lb 12.8 oz (87 kg)  10/19/18 192 lb (87.1 kg)    Physical Exam Pulmonary:     Effort: Pulmonary effort is normal. No respiratory distress.  Skin:    General: Skin is warm and dry.  Neurological:     Mental Status: She is alert. Mental status is at baseline.  Psychiatric:        Mood and Affect: Mood normal.        Behavior: Behavior normal.    Results for orders placed or performed in visit on 01/05/19  TSH  Result Value Ref Range   TSH 0.44 0.41 - 5.90  T4, free  Result Value Ref Range   Triiodothyronine,Free,Serum 3.0    T4,Free (Direct) 1.25       Assessment & Plan:  1. Cough   2. Wheezing  - albuterol (PROVENTIL HFA;VENTOLIN HFA) 108 (90 Base) MCG/ACT inhaler; Inhale 2 puffs into the lungs every  6 (six) hours as needed for wheezing or shortness of breath.  Dispense: 1 Inhaler; Refill: 0  3. Allergic rhinitis, unspecified seasonality, unspecified trigger  Suspect allergies and PND the source of her symptoms. Recommend medication as below and inhaler PRN for wheezing. Advised to call back with any concerns.  - levocetirizine (XYZAL) 5 MG tablet; Take 0.5 tablets (2.5 mg total) by mouth every evening.  Dispense: 45 tablet; Refill: 0  Follow up plan: Return if symptoms worsen or fail to improve.  Osvaldo Angst, PA-C [facility] 02/15/2019, 10:18 AM

## 2019-02-15 NOTE — Patient Instructions (Addendum)

## 2019-03-27 DIAGNOSIS — E890 Postprocedural hypothyroidism: Secondary | ICD-10-CM | POA: Diagnosis not present

## 2019-03-30 DIAGNOSIS — E890 Postprocedural hypothyroidism: Secondary | ICD-10-CM | POA: Diagnosis not present

## 2019-03-30 DIAGNOSIS — H6060 Unspecified chronic otitis externa, unspecified ear: Secondary | ICD-10-CM | POA: Diagnosis not present

## 2019-04-03 DIAGNOSIS — E039 Hypothyroidism, unspecified: Secondary | ICD-10-CM | POA: Diagnosis not present

## 2019-04-03 DIAGNOSIS — N951 Menopausal and female climacteric states: Secondary | ICD-10-CM | POA: Diagnosis not present

## 2019-04-03 DIAGNOSIS — Z72820 Sleep deprivation: Secondary | ICD-10-CM | POA: Diagnosis not present

## 2019-04-05 DIAGNOSIS — R6882 Decreased libido: Secondary | ICD-10-CM | POA: Diagnosis not present

## 2019-04-05 DIAGNOSIS — N951 Menopausal and female climacteric states: Secondary | ICD-10-CM | POA: Diagnosis not present

## 2019-04-05 DIAGNOSIS — M2550 Pain in unspecified joint: Secondary | ICD-10-CM | POA: Diagnosis not present

## 2019-04-05 DIAGNOSIS — R5383 Other fatigue: Secondary | ICD-10-CM | POA: Diagnosis not present

## 2019-04-13 DIAGNOSIS — L578 Other skin changes due to chronic exposure to nonionizing radiation: Secondary | ICD-10-CM | POA: Diagnosis not present

## 2019-04-13 DIAGNOSIS — L570 Actinic keratosis: Secondary | ICD-10-CM | POA: Diagnosis not present

## 2019-04-13 DIAGNOSIS — L400 Psoriasis vulgaris: Secondary | ICD-10-CM | POA: Diagnosis not present

## 2019-06-21 DIAGNOSIS — E890 Postprocedural hypothyroidism: Secondary | ICD-10-CM | POA: Diagnosis not present

## 2019-06-30 DIAGNOSIS — E890 Postprocedural hypothyroidism: Secondary | ICD-10-CM | POA: Diagnosis not present

## 2019-07-03 DIAGNOSIS — R6882 Decreased libido: Secondary | ICD-10-CM | POA: Diagnosis not present

## 2019-07-03 DIAGNOSIS — R5383 Other fatigue: Secondary | ICD-10-CM | POA: Diagnosis not present

## 2019-07-03 DIAGNOSIS — M2550 Pain in unspecified joint: Secondary | ICD-10-CM | POA: Diagnosis not present

## 2019-07-03 DIAGNOSIS — N951 Menopausal and female climacteric states: Secondary | ICD-10-CM | POA: Diagnosis not present

## 2019-07-05 DIAGNOSIS — R5383 Other fatigue: Secondary | ICD-10-CM | POA: Diagnosis not present

## 2019-07-05 DIAGNOSIS — R6882 Decreased libido: Secondary | ICD-10-CM | POA: Diagnosis not present

## 2019-07-05 DIAGNOSIS — Z72820 Sleep deprivation: Secondary | ICD-10-CM | POA: Diagnosis not present

## 2019-07-05 DIAGNOSIS — N951 Menopausal and female climacteric states: Secondary | ICD-10-CM | POA: Diagnosis not present

## 2019-07-25 ENCOUNTER — Ambulatory Visit (INDEPENDENT_AMBULATORY_CARE_PROVIDER_SITE_OTHER): Payer: Medicare Other

## 2019-07-25 ENCOUNTER — Other Ambulatory Visit: Payer: Self-pay

## 2019-07-25 DIAGNOSIS — Z23 Encounter for immunization: Secondary | ICD-10-CM

## 2019-09-18 DIAGNOSIS — H5213 Myopia, bilateral: Secondary | ICD-10-CM | POA: Diagnosis not present

## 2019-09-25 DIAGNOSIS — Z72820 Sleep deprivation: Secondary | ICD-10-CM | POA: Diagnosis not present

## 2019-09-25 DIAGNOSIS — R6882 Decreased libido: Secondary | ICD-10-CM | POA: Diagnosis not present

## 2019-09-25 DIAGNOSIS — N951 Menopausal and female climacteric states: Secondary | ICD-10-CM | POA: Diagnosis not present

## 2019-09-25 DIAGNOSIS — R5383 Other fatigue: Secondary | ICD-10-CM | POA: Diagnosis not present

## 2019-09-27 ENCOUNTER — Other Ambulatory Visit: Payer: Self-pay | Admitting: Family Medicine

## 2019-09-27 DIAGNOSIS — R6882 Decreased libido: Secondary | ICD-10-CM | POA: Diagnosis not present

## 2019-09-27 DIAGNOSIS — N951 Menopausal and female climacteric states: Secondary | ICD-10-CM | POA: Diagnosis not present

## 2019-09-27 DIAGNOSIS — R5383 Other fatigue: Secondary | ICD-10-CM | POA: Diagnosis not present

## 2019-09-29 ENCOUNTER — Ambulatory Visit: Payer: Medicare Other

## 2019-10-25 DIAGNOSIS — E890 Postprocedural hypothyroidism: Secondary | ICD-10-CM | POA: Diagnosis not present

## 2019-10-31 DIAGNOSIS — E890 Postprocedural hypothyroidism: Secondary | ICD-10-CM | POA: Diagnosis not present

## 2019-11-03 ENCOUNTER — Telehealth: Payer: Self-pay | Admitting: Family Medicine

## 2019-11-03 NOTE — Telephone Encounter (Signed)
Error

## 2019-11-22 LAB — MICROALBUMIN, URINE: Microalb, Ur: NORMAL

## 2019-12-07 ENCOUNTER — Ambulatory Visit: Payer: Medicare Other | Attending: Internal Medicine

## 2019-12-07 DIAGNOSIS — Z23 Encounter for immunization: Secondary | ICD-10-CM | POA: Insufficient documentation

## 2019-12-07 NOTE — Progress Notes (Signed)
   Covid-19 Vaccination Clinic  Name:  Jodi Day    MRN: [id number] DOB: [birth year]  12/07/2019  Jodi Day was observed post Covid-19 immunization for 15 minutes without incidence. She was provided with Vaccine Information Sheet and instruction to access the V-Safe system.   Jodi Day was instructed to call 911 with any severe reactions post vaccine: Marland Kitchen Difficulty breathing  . Swelling of your face and throat  . A fast heartbeat  . A bad rash all over your body  . Dizziness and weakness    Immunizations Administered    Name Date Dose VIS Date Route   Pfizer COVID-19 Vaccine 12/07/2019  8:15 AM 0.3 mL 10/27/2019 Intramuscular   Manufacturer: [employer], [employer]   Lot: [id number]   NDC: [address]-8

## 2019-12-13 DIAGNOSIS — Z1231 Encounter for screening mammogram for malignant neoplasm of breast: Secondary | ICD-10-CM | POA: Diagnosis not present

## 2019-12-13 LAB — HM MAMMOGRAPHY

## 2019-12-25 ENCOUNTER — Ambulatory Visit: Payer: Medicare Other | Attending: Internal Medicine

## 2019-12-25 DIAGNOSIS — Z23 Encounter for immunization: Secondary | ICD-10-CM

## 2019-12-25 NOTE — Progress Notes (Signed)
   Covid-19 Vaccination Clinic  Name:  BRADYN VASSEY    MRN: [id number] DOB: April 22, 1946  12/25/2019  Ms. Michl was observed post Covid-19 immunization for 15 minutes without incidence. She was provided with Vaccine Information Sheet and instruction to access the V-Safe system.   Ms. Clavel was instructed to call 911 with any severe reactions post vaccine: Marland Kitchen Difficulty breathing  . Swelling of your face and throat  . A fast heartbeat  . A bad rash all over your body  . Dizziness and weakness    Immunizations Administered    Name Date Dose VIS Date Route   Pfizer COVID-19 Vaccine 12/25/2019  8:35 AM 0.3 mL 10/27/2019 Intramuscular   Manufacturer: [employer], [employer]   Lot: [id number]   NDC: [address]-7

## 2019-12-27 ENCOUNTER — Other Ambulatory Visit: Payer: Self-pay | Admitting: Family Medicine

## 2019-12-29 ENCOUNTER — Encounter: Payer: Self-pay | Admitting: Family Medicine

## 2020-01-01 DIAGNOSIS — R6882 Decreased libido: Secondary | ICD-10-CM | POA: Diagnosis not present

## 2020-01-01 DIAGNOSIS — R5383 Other fatigue: Secondary | ICD-10-CM | POA: Diagnosis not present

## 2020-01-01 DIAGNOSIS — N951 Menopausal and female climacteric states: Secondary | ICD-10-CM | POA: Diagnosis not present

## 2020-01-01 DIAGNOSIS — E039 Hypothyroidism, unspecified: Secondary | ICD-10-CM | POA: Diagnosis not present

## 2020-01-03 DIAGNOSIS — R6882 Decreased libido: Secondary | ICD-10-CM | POA: Diagnosis not present

## 2020-01-03 DIAGNOSIS — F3289 Other specified depressive episodes: Secondary | ICD-10-CM | POA: Diagnosis not present

## 2020-01-03 DIAGNOSIS — Z72820 Sleep deprivation: Secondary | ICD-10-CM | POA: Diagnosis not present

## 2020-01-03 DIAGNOSIS — N951 Menopausal and female climacteric states: Secondary | ICD-10-CM | POA: Diagnosis not present

## 2020-01-04 NOTE — Progress Notes (Signed)
Subjective:   Jodi Day is a 74 y.o. female who presents for Medicare Annual (Subsequent) preventive examination.    This visit is being conducted through telemedicine due to the COVID-19 pandemic. This patient has given me verbal consent via doximity to conduct this visit, patient states they are participating from their home address. Some vital signs may be absent or patient reported.    Patient identification: identified by name, DOB, and current address  Review of Systems:  N/A  Cardiac Risk Factors include: advanced age (>82men, >52 women)     Objective:     Vitals: There were no vitals taken for this visit.  There is no height or weight on file to calculate BMI. Unable to obtain vitals due to visit being conducted via telephonically.   Advanced Directives 01/08/2020 11/02/2018 10/19/2018 09/23/2018 09/22/2017 04/16/2016  Does Patient Have a Medical Advance Directive? No No No No No No  Does patient want to make changes to medical advance directive? - - - - No - Patient declined -  Would patient like information on creating a medical advance directive? No - Patient declined No - Patient declined No - Patient declined No - Patient declined - -    Tobacco Social History   Tobacco Use  Smoking Status Former Smoker  . Packs/day: 1.00  . Years: 7.00  . Pack years: 7.00  . Quit date: 11/15/1977  . Years since quitting: 42.1  Smokeless Tobacco Never Used     Counseling given: Not Answered   Clinical Intake:  Pre-visit preparation completed: Yes  Pain : No/denies pain Pain Score: 0-No pain     Nutritional Risks: None Diabetes: No  How often do you need to have someone help you when you read instructions, pamphlets, or other written materials from your doctor or pharmacy?: 1 - Never  Interpreter Needed?: No  Information entered by :: [facility], LPN  Past Medical History:  Diagnosis Date  . Anxiety   . Diabetes mellitus without complication (HCC)   .  Hypothyroidism   . Thyroid disease    Past Surgical History:  Procedure Laterality Date  . ABDOMINAL HYSTERECTOMY  1992   fibroids. ovaries intact  . CHOLECYSTECTOMY  1996  . EYE SURGERY Bilateral 2014   cataract surgery  . KNEE ARTHROPLASTY Right 11/02/2018   Procedure: COMPUTER ASSISTED TOTAL KNEE ARTHROPLASTY;  Surgeon: Donato Heinz, MD;  Location: [facility] ORS;  Service: Orthopedics;  Laterality: Right;  . thyroidectomy  1973  . tonsillectomy and adenoidectomy  1973   Family History  Problem Relation Age of Onset  . Diabetes Mother   . Hypothyroidism Mother   . Stroke Mother   . COPD Father   . Prostate cancer Brother   . Pneumonia Brother        spesis from pneumonia  . Prostate cancer Brother   . Colon polyps Brother   . Parkinson's disease Brother   . Cancer Brother        lung cancer   Social History   Socioeconomic History  . Marital status: Married    Spouse name: Thereasa Distance  . Number of children: 2  . Years of education: Associates  . Highest education level: Associate degree: occupational, Scientist, product/process development, or vocational program  Occupational History  . Occupation: Self Employeed owns a Research officer, political party business    Comment: retired  Tobacco Use  . Smoking status: Former Smoker    Packs/day: 1.00    Years: 7.00    Pack years: 7.00  Quit date: 11/15/1977    Years since quitting: 42.1  . Smokeless tobacco: Never Used  Substance and Sexual Activity  . Alcohol use: Yes    Alcohol/week: 0.0 - 1.0 standard drinks  . Drug use: No  . Sexual activity: Yes  Other Topics Concern  . Not on file  Social History Narrative  . Not on file   Social Determinants of Health   Financial Resource Strain: Low Risk   . Difficulty of Paying Living Expenses: Not hard at all  Food Insecurity: No Food Insecurity  . Worried About Programme researcher, broadcasting/film/video in the Last Year: Never true  . Ran Out of Food in the Last Year: Never true  Transportation Needs: No Transportation Needs  . Lack of  Transportation (Medical): No  . Lack of Transportation (Non-Medical): No  Physical Activity: Sufficiently Active  . Days of Exercise per Week: 4 days  . Minutes of Exercise per Session: 60 min  Stress: No Stress Concern Present  . Feeling of Stress : Not at all  Social Connections: Slightly Isolated  . Frequency of Communication with Friends and Family: More than three times a week  . Frequency of Social Gatherings with Friends and Family: More than three times a week  . Attends Religious Services: More than 4 times per year  . Active Member of Clubs or Organizations: No  . Attends Banker Meetings: Never  . Marital Status: Married    Outpatient Encounter Medications as of 01/08/2020  Medication Sig  . b complex vitamins tablet Take 1 tablet by mouth daily.  Marland Kitchen DHEA 10 MG TABS Take 10 mg by mouth daily.  Marland Kitchen liothyronine (CYTOMEL) 5 MCG tablet Take 5 mcg by mouth daily.  . metFORMIN (GLUCOPHAGE-XR) 500 MG 24 hr tablet TAKE ONE TABLET EVERY DAY BEFORE BREAKFAST  . Multiple Vitamins-Minerals (ZINC PO) Take by mouth daily.  Marland Kitchen OVER THE COUNTER MEDICATION Take 12.5 mg by mouth daily. Iodine Complex  . PHOSPHATIDYLSERINE PO Take 300 mg by mouth daily.  Marland Kitchen PRESCRIPTION MEDICATION Place 1 application rectally every 4 (four) months. Hormone replacement therapy pellet inserted rectally every 4 months. Dosing depends on blood work  . progesterone (PROMETRIUM) 200 MG capsule Take 200 mg by mouth daily.   Marland Kitchen SYNTHROID 125 MCG tablet Take 125 mcg by mouth daily before breakfast.   . TURMERIC PO Take 750 mg by mouth daily.   Marland Kitchen VITAMIN D, CHOLECALCIFEROL, PO Take 10,000 Units by mouth daily.   Marland Kitchen VITAMIN K PO Take 30.5 mg by mouth daily.   Marland Kitchen acetaminophen (TYLENOL) 500 MG tablet Take 500 mg by mouth every 6 (six) hours as needed for moderate pain.  Marland Kitchen albuterol (PROVENTIL HFA;VENTOLIN HFA) 108 (90 Base) MCG/ACT inhaler Inhale 2 puffs into the lungs every 6 (six) hours as needed for wheezing or  shortness of breath. (Patient not taking: Reported on 01/08/2020)  . Hormone Cream Base (HRT BASE) CREA by Does not apply route.  Marland Kitchen levocetirizine (XYZAL) 5 MG tablet Take 0.5 tablets (2.5 mg total) by mouth every evening.  . Menthol, Topical Analgesic, (BIOFREEZE EX) Apply 1 application topically daily as needed (pain).   . vitamin E 400 UNIT capsule Take 400 Units by mouth once a week.    No facility-administered encounter medications on file as of 01/08/2020.    Activities of Daily Living In your present state of health, do you have any difficulty performing the following activities: 01/08/2020  Hearing? N  Vision? N  Difficulty concentrating  or making decisions? N  Walking or climbing stairs? N  Dressing or bathing? N  Doing errands, shopping? N  Preparing Food and eating ? N  Using the Toilet? N  In the past six months, have you accidently leaked urine? N  Do you have problems with loss of bowel control? N  Managing your Medications? N  Managing your Finances? N  Housekeeping or managing your Housekeeping? N  Some recent data might be hidden    Patient Care Team: Virginia Crews, MD as PCP - General (Family Medicine) Margaretha Sheffield, MD (Otolaryngology)    Assessment:   This is a routine wellness examination for Elzina.  Exercise Activities and Dietary recommendations Current Exercise Habits: Structured exercise class, Type of exercise: Other - see comments(aerobic and pilates), Time (Minutes): 60, Frequency (Times/Week): 4, Weekly Exercise (Minutes/Week): 240, Intensity: Mild, Exercise limited by: None identified  Goals    . DIET - REDUCE CALORIE INTAKE     Recommend to cut back on carbohydrates in daily diet (avoid breads).        Fall Risk: Fall Risk  01/08/2020 01/05/2019 09/23/2018 09/22/2017 04/16/2016  Falls in the past year? 0 0 0 No No  Number falls in past yr: 0 - - - -  Injury with Fall? 0 - - - -    FALL RISK PREVENTION PERTAINING TO THE HOME:  Any  stairs in or around the home? Yes  If so, are there any without handrails? No   Home free of loose throw rugs in walkways, pet beds, electrical cords, etc? Yes  Adequate lighting in your home to reduce risk of falls? Yes   ASSISTIVE DEVICES UTILIZED TO PREVENT FALLS:  Life alert? No  Use of a cane, walker or w/c? No  Grab bars in the bathroom? No  Shower chair or bench in shower? Yes  Elevated toilet seat or a handicapped toilet? No    TIMED UP AND GO:  Was the test performed? No .    Depression Screen PHQ 2/9 Scores 01/08/2020 01/05/2019 09/23/2018 09/22/2017  PHQ - 2 Score 0 0 0 0  PHQ- 9 Score - 1 - 2     Cognitive Function     6CIT Screen 01/08/2020 09/22/2017  What Year? 0 points 0 points  What month? 0 points 0 points  What time? 0 points 0 points  Count back from 20 0 points 0 points  Months in reverse 2 points 2 points  Repeat phrase 0 points 0 points  Total Score 2 2    Immunization History  Administered Date(s) Administered  . Fluad Quad(high Dose 65+) 07/25/2019  . Influenza-Unspecified 08/15/2018  . PFIZER SARS-COV-2 Vaccination 12/07/2019, 12/25/2019  . Pneumococcal Conjugate-13 01/08/2015  . Pneumococcal Polysaccharide-23 01/15/2016  . Td 10/20/2017  . Tdap 07/01/2007  . Zoster 01/04/2009  . Zoster Recombinat (Shingrix) 12/27/2017, 02/28/2018    Qualifies for Shingles Vaccine? Completed series  Tdap: Up to date  Flu Vaccine: Up to date  Pneumococcal Vaccine: Completed series  Screening Tests Health Maintenance  Topic Date Due  . MAMMOGRAM  12/12/2020  . DEXA SCAN  12/01/2022  . COLONOSCOPY  01/04/2023  . TETANUS/TDAP  10/21/2027  . INFLUENZA VACCINE  Completed  . Hepatitis C Screening  Completed  . PNA vac Low Risk Adult  Completed    Cancer Screenings:  Colorectal Screening: Completed 01/04/18. Repeat every 5 years.   Mammogram: Completed 12/13/19. Repeat every 1-2 years as advised.   Bone Density: Completed 12/01/17. Results reflect  OSTEOPENIA. Repeat every 5 years.   Lung Cancer Screening: (Low Dose CT Chest recommended if Age 60-80 years, 30 pack-year currently smoking OR have quit w/in 15years.) does not qualify.   Additional Screening:  Hepatitis C Screening: Up to date  Vision Screening: Recommended annual ophthalmology exams for early detection of glaucoma and other disorders of the eye.  Dental Screening: Recommended annual dental exams for proper oral hygiene  Community Resource Referral:  CRR required this visit?  No       Plan:  I have personally reviewed and addressed the Medicare Annual Wellness questionnaire and have noted the following in the patient's chart:  A. Medical and social history B. Use of alcohol, tobacco or illicit drugs  C. Current medications and supplements D. Functional ability and status E.  Nutritional status F.  Physical activity G. Advance directives H. List of other physicians I.  Hospitalizations, surgeries, and ER visits in previous 12 months J.  Vitals K. Screenings such as hearing and vision if needed, cognitive and depression L. Referrals and appointments   In addition, I have reviewed and discussed with patient certain preventive protocols, quality metrics, and best practice recommendations. A written personalized care plan for preventive services as well as general preventive health recommendations were provided to patient. Nurse Health Advisor  Signed,    Saivon Prowse [place], [place]  [id number] Nurse Health Advisor   Nurse Notes: None.

## 2020-01-08 ENCOUNTER — Ambulatory Visit (INDEPENDENT_AMBULATORY_CARE_PROVIDER_SITE_OTHER): Payer: Medicare Other

## 2020-01-08 ENCOUNTER — Other Ambulatory Visit: Payer: Self-pay

## 2020-01-08 DIAGNOSIS — Z0000 Encounter for general adult medical examination without abnormal findings: Secondary | ICD-10-CM

## 2020-01-08 NOTE — Patient Instructions (Signed)
Jodi Day , Thank you for taking time to come for your Medicare Wellness Visit. I appreciate your ongoing commitment to your health goals. Please review the following plan we discussed and let me know if I can assist you in the future.   Screening recommendations/referrals: Colonoscopy: Up to date, due 12/2022 Mammogram: Up to date, due 11/2021 Bone Density: Up to date, due 11/2022 Recommended yearly ophthalmology/optometry visit for glaucoma screening and checkup Recommended yearly dental visit for hygiene and checkup  Vaccinations: Influenza vaccine: Up to date Pneumococcal vaccine: Completed series Tdap vaccine: Up to date, due 10/2027  Shingles vaccine: Completed series    Advanced directives: Advance directive discussed with you today. Even though you declined this today please call our office should you change your mind and we can give you the proper paperwork for you to fill out.  Conditions/risks identified: Recommend to cut back on carbohydrates in daily diet (avoid breads).   Next appointment: 01/09/20 @ 9:20 AM with Dr Beryle Flock    Preventive Care 72 Years and Older, Female Preventive care refers to lifestyle choices and visits with your health care provider that can promote health and wellness. What does preventive care include?  A yearly physical exam. This is also called an annual well check.  Dental exams once or twice a year.  Routine eye exams. Ask your health care provider how often you should have your eyes checked.  Personal lifestyle choices, including:  Daily care of your teeth and gums.  Regular physical activity.  Eating a healthy diet.  Avoiding tobacco and drug use.  Limiting alcohol use.  Practicing safe sex.  Taking low-dose aspirin every day.  Taking vitamin and mineral supplements as recommended by your health care provider. What happens during an annual well check? The services and screenings done by your health care provider during your  annual well check will depend on your age, overall health, lifestyle risk factors, and family history of disease. Counseling  Your health care provider may ask you questions about your:  Alcohol use.  Tobacco use.  Drug use.  Emotional well-being.  Home and relationship well-being.  Sexual activity.  Eating habits.  History of falls.  Memory and ability to understand (cognition).  Work and work Astronomer.  Reproductive health. Screening  You may have the following tests or measurements:  Height, weight, and BMI.  Blood pressure.  Lipid and cholesterol levels. These may be checked every 5 years, or more frequently if you are over 16 years old.  Skin check.  Lung cancer screening. You may have this screening every year starting at age 72 if you have a 30-pack-year history of smoking and currently smoke or have quit within the past 15 years.  Fecal occult blood test (FOBT) of the stool. You may have this test every year starting at age 72.  Flexible sigmoidoscopy or colonoscopy. You may have a sigmoidoscopy every 5 years or a colonoscopy every 10 years starting at age 72.  Hepatitis C blood test.  Hepatitis B blood test.  Sexually transmitted disease (STD) testing.  Diabetes screening. This is done by checking your blood sugar (glucose) after you have not eaten for a while (fasting). You may have this done every 1-3 years.  Bone density scan. This is done to screen for osteoporosis. You may have this done starting at age 72.  Mammogram. This may be done every 1-2 years. Talk to your health care provider about how often you should have regular mammograms. Talk with your  health care provider about your test results, treatment options, and if necessary, the need for more tests. Vaccines  Your health care provider may recommend certain vaccines, such as:  Influenza vaccine. This is recommended every year.  Tetanus, diphtheria, and acellular pertussis (Tdap, Td)  vaccine. You may need a Td booster every 10 years.  Zoster vaccine. You may need this after age 72.  Pneumococcal 13-valent conjugate (PCV13) vaccine. One dose is recommended after age 72.  Pneumococcal polysaccharide (PPSV23) vaccine. One dose is recommended after age 72. Talk to your health care provider about which screenings and vaccines you need and how often you need them. This information is not intended to replace advice given to you by your health care provider. Make sure you discuss any questions you have with your health care provider. Document Released: 11/29/2015 Document Revised: 07/22/2016 Document Reviewed: 09/03/2015 Elsevier Interactive Patient Education  2017 [employer] Prevention in the Home Falls can cause injuries. They can happen to people of all ages. There are many things you can do to make your home safe and to help prevent falls. What can I do on the outside of my home?  Regularly fix the edges of walkways and driveways and fix any cracks.  Remove anything that might make you trip as you walk through a door, such as a raised step or threshold.  Trim any bushes or trees on the path to your home.  Use bright outdoor lighting.  Clear any walking paths of anything that might make someone trip, such as rocks or tools.  Regularly check to see if handrails are loose or broken. Make sure that both sides of any steps have handrails.  Any raised decks and porches should have guardrails on the edges.  Have any leaves, snow, or ice cleared regularly.  Use sand or salt on walking paths during winter.  Clean up any spills in your garage right away. This includes oil or grease spills. What can I do in the bathroom?  Use night lights.  Install grab bars by the toilet and in the tub and shower. Do not use towel bars as grab bars.  Use non-skid mats or decals in the tub or shower.  If you need to sit down in the shower, use a plastic, non-slip  stool.  Keep the floor dry. Clean up any water that spills on the floor as soon as it happens.  Remove soap buildup in the tub or shower regularly.  Attach bath mats securely with double-sided non-slip rug tape.  Do not have throw rugs and other things on the floor that can make you trip. What can I do in the bedroom?  Use night lights.  Make sure that you have a light by your bed that is easy to reach.  Do not use any sheets or blankets that are too big for your bed. They should not hang down onto the floor.  Have a firm chair that has side arms. You can use this for support while you get dressed.  Do not have throw rugs and other things on the floor that can make you trip. What can I do in the kitchen?  Clean up any spills right away.  Avoid walking on wet floors.  Keep items that you use a lot in easy-to-reach places.  If you need to reach something above you, use a strong step stool that has a grab bar.  Keep electrical cords out of the way.  Do not use  floor polish or wax that makes floors slippery. If you must use wax, use non-skid floor wax.  Do not have throw rugs and other things on the floor that can make you trip. What can I do with my stairs?  Do not leave any items on the stairs.  Make sure that there are handrails on both sides of the stairs and use them. Fix handrails that are broken or loose. Make sure that handrails are as long as the stairways.  Check any carpeting to make sure that it is firmly attached to the stairs. Fix any carpet that is loose or worn.  Avoid having throw rugs at the top or bottom of the stairs. If you do have throw rugs, attach them to the floor with carpet tape.  Make sure that you have a light switch at the top of the stairs and the bottom of the stairs. If you do not have them, ask someone to add them for you. What else can I do to help prevent falls?  Wear shoes that:  Do not have high heels.  Have rubber bottoms.  Are  comfortable and fit you well.  Are closed at the toe. Do not wear sandals.  If you use a stepladder:  Make sure that it is fully opened. Do not climb a closed stepladder.  Make sure that both sides of the stepladder are locked into place.  Ask someone to hold it for you, if possible.  Clearly mark and make sure that you can see:  Any grab bars or handrails.  First and last steps.  Where the edge of each step is.  Use tools that help you move around (mobility aids) if they are needed. These include:  Canes.  Walkers.  Scooters.  Crutches.  Turn on the lights when you go into a dark area. Replace any light bulbs as soon as they burn out.  Set up your furniture so you have a clear path. Avoid moving your furniture around.  If any of your floors are uneven, fix them.  If there are any pets around you, be aware of where they are.  Review your medicines with your doctor. Some medicines can make you feel dizzy. This can increase your chance of falling. Ask your doctor what other things that you can do to help prevent falls. This information is not intended to replace advice given to you by your health care provider. Make sure you discuss any questions you have with your health care provider. Document Released: 08/29/2009 Document Revised: 04/09/2016 Document Reviewed: 12/07/2014 Elsevier Interactive Patient Education  2017 [employer].

## 2020-01-09 ENCOUNTER — Ambulatory Visit: Payer: Medicare Other | Admitting: Family Medicine

## 2020-01-11 ENCOUNTER — Ambulatory Visit: Payer: Medicare Other | Admitting: Family Medicine

## 2020-01-11 ENCOUNTER — Ambulatory Visit: Payer: Medicare Other

## 2020-01-18 ENCOUNTER — Other Ambulatory Visit: Payer: Self-pay

## 2020-01-18 ENCOUNTER — Encounter: Payer: Self-pay | Admitting: Family Medicine

## 2020-01-18 ENCOUNTER — Ambulatory Visit (INDEPENDENT_AMBULATORY_CARE_PROVIDER_SITE_OTHER): Payer: Medicare Other | Admitting: Family Medicine

## 2020-01-18 VITALS — BP 126/73 | HR 78 | Temp 97.1°F | Resp 16 | Ht 69.0 in | Wt 193.0 lb

## 2020-01-18 DIAGNOSIS — R739 Hyperglycemia, unspecified: Secondary | ICD-10-CM

## 2020-01-18 DIAGNOSIS — J309 Allergic rhinitis, unspecified: Secondary | ICD-10-CM

## 2020-01-18 DIAGNOSIS — Z0000 Encounter for general adult medical examination without abnormal findings: Secondary | ICD-10-CM | POA: Diagnosis not present

## 2020-01-18 DIAGNOSIS — E559 Vitamin D deficiency, unspecified: Secondary | ICD-10-CM

## 2020-01-18 DIAGNOSIS — E781 Pure hyperglyceridemia: Secondary | ICD-10-CM

## 2020-01-18 DIAGNOSIS — E039 Hypothyroidism, unspecified: Secondary | ICD-10-CM | POA: Diagnosis not present

## 2020-01-18 DIAGNOSIS — R0982 Postnasal drip: Secondary | ICD-10-CM

## 2020-01-18 NOTE — Patient Instructions (Signed)
Preventive Care 72 Years and Older, Female Preventive care refers to lifestyle choices and visits with your health care provider that can promote health and wellness. This includes:  A yearly physical exam. This is also called an annual well check.  Regular dental and eye exams.  Immunizations.  Screening for certain conditions.  Healthy lifestyle choices, such as diet and exercise. What can I expect for my preventive care visit? Physical exam Your health care provider will check:  Height and weight. These may be used to calculate body mass index (BMI), which is a measurement that tells if you are at a healthy weight.  Heart rate and blood pressure.  Your skin for abnormal spots. Counseling Your health care provider may ask you questions about:  Alcohol, tobacco, and drug use.  Emotional well-being.  Home and relationship well-being.  Sexual activity.  Eating habits.  History of falls.  Memory and ability to understand (cognition).  Work and work Statistician.  Pregnancy and menstrual history. What immunizations do I need?  Influenza (flu) vaccine  This is recommended every year. Tetanus, diphtheria, and pertussis (Tdap) vaccine  You may need a Td booster every 10 years. Varicella (chickenpox) vaccine  You may need this vaccine if you have not already been vaccinated. Zoster (shingles) vaccine  You may need this after age 72. Pneumococcal conjugate (PCV13) vaccine  One dose is recommended after age 72. Pneumococcal polysaccharide (PPSV23) vaccine  One dose is recommended after age 72. Measles, mumps, and rubella (MMR) vaccine  You may need at least one dose of MMR if you were born in 1957 or later. You may also need a second dose. Meningococcal conjugate (MenACWY) vaccine  You may need this if you have certain conditions. Hepatitis A vaccine  You may need this if you have certain conditions or if you travel or work in places where you may be exposed  to hepatitis A. Hepatitis B vaccine  You may need this if you have certain conditions or if you travel or work in places where you may be exposed to hepatitis B. Haemophilus influenzae type b (Hib) vaccine  You may need this if you have certain conditions. You may receive vaccines as individual doses or as more than one vaccine together in one shot (combination vaccines). Talk with your health care provider about the risks and benefits of combination vaccines. What tests do I need? Blood tests  Lipid and cholesterol levels. These may be checked every 5 years, or more frequently depending on your overall health.  Hepatitis C test.  Hepatitis B test. Screening  Lung cancer screening. You may have this screening every year starting at age 39 if you have a 30-pack-year history of smoking and currently smoke or have quit within the past 15 years.  Colorectal cancer screening. All adults should have this screening starting at age 36 and continuing until age 15. Your health care provider may recommend screening at age 23 if you are at increased risk. You will have tests every 1-10 years, depending on your results and the type of screening test.  Diabetes screening. This is done by checking your blood sugar (glucose) after you have not eaten for a while (fasting). You may have this done every 1-3 years.  Mammogram. This may be done every 1-2 years. Talk with your health care provider about how often you should have regular mammograms.  BRCA-related cancer screening. This may be done if you have a family history of breast, ovarian, tubal, or peritoneal cancers.  Other tests  Sexually transmitted disease (STD) testing.  Bone density scan. This is done to screen for osteoporosis. You may have this done starting at age 44. Follow these instructions at home: Eating and drinking  Eat a diet that includes fresh fruits and vegetables, whole grains, lean protein, and low-fat dairy products. Limit  your intake of foods with high amounts of sugar, saturated fats, and salt.  Take vitamin and mineral supplements as recommended by your health care provider.  Do not drink alcohol if your health care provider tells you not to drink.  If you drink alcohol: ? Limit how much you have to 0-1 drink a day. ? Be aware of how much alcohol is in your drink. In the [place] one drink equals one 12 oz bottle of beer (355 mL), one 5 oz glass of wine (148 mL), or one 1 oz glass of hard liquor (44 mL). Lifestyle  Take daily care of your teeth and gums.  Stay active. Exercise for at least 30 minutes on 5 or more days each week.  Do not use any products that contain nicotine or tobacco, such as cigarettes, e-cigarettes, and chewing tobacco. If you need help quitting, ask your health care provider.  If you are sexually active, practice safe sex. Use a condom or other form of protection in order to prevent STIs (sexually transmitted infections).  Talk with your health care provider about taking a low-dose aspirin or statin. What's next?  Go to your health care provider once a year for a well check visit.  Ask your health care provider how often you should have your eyes and teeth checked.  Stay up to date on all vaccines. This information is not intended to replace advice given to you by your health care provider. Make sure you discuss any questions you have with your health care provider. Document Revised: 10/27/2018 Document Reviewed: 10/27/2018 Elsevier Patient Education  2020 [employer].

## 2020-01-18 NOTE — Assessment & Plan Note (Signed)
Followed by ENT Repeat TSH Continue Synthroid at current dose

## 2020-01-18 NOTE — Assessment & Plan Note (Signed)
Recheck lipids Not currently on a statin

## 2020-01-18 NOTE — Progress Notes (Signed)
Patient: Jodi Day, Female    DOB: 05-11-1946, 74 y.o.   MRN: [id number] Visit Date: 01/18/2020  Today's Provider: Shirlee Latch, MD   Chief Complaint  Patient presents with  . Annual Exam   Subjective:     Complete Physical Jodi Day is a 74 y.o. female. She feels well. She reports exercising daily. She reports she is sleeping well.  04/16/2016 Pap-normal 12/13/2019 Mammogram 10/31/2018 Colonoscopy-polyps Pathology-Tubular Adenomal -----------------------------------------------------------   Review of Systems  Constitutional: Negative.   HENT: Negative.   Eyes: Negative.   Respiratory: Negative.   Cardiovascular: Negative.   Gastrointestinal: Negative.   Endocrine: Negative.   Genitourinary: Negative.   Musculoskeletal: Negative.   Skin: Negative.   Allergic/Immunologic: Negative.   Neurological: Negative.   Hematological: Negative.   Psychiatric/Behavioral: Negative.     Social History   Socioeconomic History  . Marital status: Married    Spouse name: Thereasa Distance  . Number of children: 2  . Years of education: Associates  . Highest education level: Associate degree: occupational, Scientist, product/process development, or vocational program  Occupational History  . Occupation: Self Employeed owns a Research officer, political party business    Comment: retired  Tobacco Use  . Smoking status: Former Smoker    Packs/day: 1.00    Years: 7.00    Pack years: 7.00    Quit date: 11/15/1977    Years since quitting: 42.2  . Smokeless tobacco: Never Used  Substance and Sexual Activity  . Alcohol use: Yes    Alcohol/week: 0.0 - 1.0 standard drinks  . Drug use: No  . Sexual activity: Yes  Other Topics Concern  . Not on file  Social History Narrative  . Not on file   Social Determinants of Health   Financial Resource Strain: Low Risk   . Difficulty of Paying Living Expenses: Not hard at all  Food Insecurity: No Food Insecurity  . Worried About Programme researcher, broadcasting/film/video in the Last Year: Never  true  . Ran Out of Food in the Last Year: Never true  Transportation Needs: No Transportation Needs  . Lack of Transportation (Medical): No  . Lack of Transportation (Non-Medical): No  Physical Activity: Sufficiently Active  . Days of Exercise per Week: 4 days  . Minutes of Exercise per Session: 60 min  Stress: No Stress Concern Present  . Feeling of Stress : Not at all  Social Connections: Slightly Isolated  . Frequency of Communication with Friends and Family: More than three times a week  . Frequency of Social Gatherings with Friends and Family: More than three times a week  . Attends Religious Services: More than 4 times per year  . Active Member of Clubs or Organizations: No  . Attends Banker Meetings: Never  . Marital Status: Married  Catering manager Violence: Not At Risk  . Fear of Current or Ex-Partner: No  . Emotionally Abused: No  . Physically Abused: No  . Sexually Abused: No    Past Medical History:  Diagnosis Date  . Anxiety   . Diabetes mellitus without complication (HCC)   . Hypothyroidism   . Thyroid disease      Patient Active Problem List   Diagnosis Date Noted  . Total knee replacement status 11/02/2018  . Primary osteoarthritis of right knee 09/11/2018  . Polyarthralgia 10/20/2017  . Joint stiffness 10/20/2017  . Grief counseling 01/15/2016  . Anxiety 07/15/2015  . Psoriasis 07/15/2015  . Blood glucose elevated 07/15/2015  . Hypertriglyceridemia 07/15/2015  .  Adult hypothyroidism 07/15/2015  . Hemorrhoids, internal 07/15/2015  . Symptomatic menopausal or female climacteric states 07/15/2015  . Avitaminosis D 07/15/2015  . Climacteric 07/15/2015  . Disease of skin and subcutaneous tissue 10/24/2012  . Big thyroid 07/04/2007  . Urge incontinence 07/04/2007  . Goiter, non-toxic 07/04/2007    Past Surgical History:  Procedure Laterality Date  . ABDOMINAL HYSTERECTOMY  1992   fibroids. ovaries intact  . CHOLECYSTECTOMY  1996  .  EYE SURGERY Bilateral 2014   cataract surgery  . KNEE ARTHROPLASTY Right 11/02/2018   Procedure: COMPUTER ASSISTED TOTAL KNEE ARTHROPLASTY;  Surgeon: Donato Heinz, MD;  Location: [facility] ORS;  Service: Orthopedics;  Laterality: Right;  . thyroidectomy  1973  . tonsillectomy and adenoidectomy  1973    Her family history includes COPD in her father; Cancer in her brother; Colon polyps in her brother; Diabetes in her mother; Hypothyroidism in her mother; Parkinson's disease in her brother; Pneumonia in her brother; Prostate cancer in her brother and brother; Stroke in her mother.   Current Outpatient Medications:  .  b complex vitamins tablet, Take 1 tablet by mouth daily., Disp: , Rfl:  .  DHEA 10 MG TABS, Take 10 mg by mouth daily., Disp: , Rfl:  .  liothyronine (CYTOMEL) 5 MCG tablet, Take 5 mcg by mouth daily., Disp: , Rfl:  .  metFORMIN (GLUCOPHAGE-XR) 500 MG 24 hr tablet, TAKE ONE TABLET EVERY DAY BEFORE BREAKFAST, Disp: 90 tablet, Rfl: 0 .  Multiple Vitamins-Minerals (ZINC PO), Take by mouth daily., Disp: , Rfl:  .  OVER THE COUNTER MEDICATION, Take 12.5 mg by mouth daily. Iodine Complex, Disp: , Rfl:  .  PHOSPHATIDYLSERINE PO, Take 300 mg by mouth daily., Disp: , Rfl:  .  PRESCRIPTION MEDICATION, Place 1 application rectally every 4 (four) months. Hormone replacement therapy pellet inserted rectally every 4 months. Dosing depends on blood work, Disp: , Rfl:  .  progesterone (PROMETRIUM) 200 MG capsule, Take 200 mg by mouth daily. , Disp: , Rfl:  .  SYNTHROID 125 MCG tablet, Take 125 mcg by mouth daily before breakfast. , Disp: , Rfl: 10 .  TURMERIC PO, Take 750 mg by mouth daily. , Disp: , Rfl:  .  VITAMIN D, CHOLECALCIFEROL, PO, Take 10,000 Units by mouth daily. , Disp: , Rfl:  .  VITAMIN K PO, Take 30.5 mg by mouth daily. , Disp: , Rfl:  .  albuterol (PROVENTIL HFA;VENTOLIN HFA) 108 (90 Base) MCG/ACT inhaler, Inhale 2 puffs into the lungs every 6 (six) hours as needed for wheezing or  shortness of breath. (Patient not taking: Reported on 01/08/2020), Disp: 1 Inhaler, Rfl: 0  Patient Care Team: Erasmo Downer, MD as PCP - General (Family Medicine) Vernie Murders, MD (Otolaryngology)     Objective:    Vitals: BP 126/73 (BP Location: Left Arm, Patient Position: Sitting, Cuff Size: Large)   Pulse 78   Temp (!) 97.1 F (36.2 C) (Temporal)   Resp 16   Ht 5\' 9"  (1.753 m)   Wt 193 lb (87.5 kg)   BMI 28.50 kg/m   Physical Exam Vitals reviewed.  Constitutional:      General: She is not in acute distress.    Appearance: Normal appearance. She is well-developed. She is not diaphoretic.  HENT:     Head: Normocephalic and atraumatic.     Right Ear: Tympanic membrane, ear canal and external ear normal.     Left Ear: Tympanic membrane, ear canal and external ear  normal.  Eyes:     General: No scleral icterus.    Conjunctiva/sclera: Conjunctivae normal.     Pupils: Pupils are equal, round, and reactive to light.  Neck:     Thyroid: No thyromegaly.  Cardiovascular:     Rate and Rhythm: Normal rate and regular rhythm.     Pulses: Normal pulses.     Heart sounds: Normal heart sounds. No murmur.  Pulmonary:     Effort: Pulmonary effort is normal. No respiratory distress.     Breath sounds: Normal breath sounds. No wheezing or rales.  Abdominal:     General: There is no distension.     Palpations: Abdomen is soft.     Tenderness: There is no abdominal tenderness. There is no guarding or rebound.  Musculoskeletal:        General: No deformity.     Cervical back: Neck supple.     Right lower leg: No edema.     Left lower leg: No edema.  Lymphadenopathy:     Cervical: No cervical adenopathy.  Skin:    General: Skin is warm and dry.     Findings: No rash.  Neurological:     Mental Status: She is alert and oriented to person, place, and time. Mental status is at baseline.  Psychiatric:        Mood and Affect: Mood normal.        Behavior: Behavior normal.         Thought Content: Thought content normal.     Activities of Daily Living In your present state of health, do you have any difficulty performing the following activities: 01/08/2020  Hearing? N  Vision? N  Difficulty concentrating or making decisions? N  Walking or climbing stairs? N  Dressing or bathing? N  Doing errands, shopping? N  Preparing Food and eating ? N  Using the Toilet? N  In the past six months, have you accidently leaked urine? N  Do you have problems with loss of bowel control? N  Managing your Medications? N  Managing your Finances? N  Housekeeping or managing your Housekeeping? N  Some recent data might be hidden    Fall Risk Assessment Fall Risk  01/08/2020 01/05/2019 09/23/2018 09/22/2017 04/16/2016  Falls in the past year? 0 0 0 No No  Number falls in past yr: 0 - - - -  Injury with Fall? 0 - - - -     Depression Screen PHQ 2/9 Scores 01/08/2020 01/05/2019 09/23/2018 09/22/2017  PHQ - 2 Score 0 0 0 0  PHQ- 9 Score - 1 - 2    6CIT Screen 01/08/2020  What Year? 0 points  What month? 0 points  What time? 0 points  Count back from 20 0 points  Months in reverse 2 points  Repeat phrase 0 points  Total Score 2   Audit-C Alcohol Use Screening   Alcohol Use Disorder Test (AUDIT) 01/05/2019 01/08/2020  1. How often do you have a drink containing alcohol? 1 2  2. How many drinks containing alcohol do you have on a typical day when you are drinking? 0 0  3. How often do you have six or more drinks on one occasion? 0 0  AUDIT-C Score 1 2    A score of 3 or more in women, and 4 or more in men indicates increased risk for alcohol abuse, EXCEPT if all of the points are from question 1     Assessment & Plan:  Annual Physical Reviewed patient's Family Medical History Reviewed and updated list of patient's medical providers Assessment of cognitive impairment was done Assessed patient's functional ability Established a written schedule for health screening  services Health Risk Assessent Completed and Reviewed  Exercise Activities and Dietary recommendations Goals    . DIET - REDUCE CALORIE INTAKE     Recommend to cut back on carbohydrates in daily diet (avoid breads).        Immunization History  Administered Date(s) Administered  . Fluad Quad(high Dose 65+) 07/25/2019  . Influenza-Unspecified 08/15/2018  . PFIZER SARS-COV-2 Vaccination 12/07/2019, 12/25/2019  . Pneumococcal Conjugate-13 01/08/2015  . Pneumococcal Polysaccharide-23 01/15/2016  . Td 10/20/2017  . Tdap 07/01/2007  . Zoster 01/04/2009  . Zoster Recombinat (Shingrix) 12/27/2017, 02/28/2018    Health Maintenance  Topic Date Due  . MAMMOGRAM  12/12/2020  . DEXA SCAN  12/01/2022  . COLONOSCOPY  01/04/2023  . TETANUS/TDAP  10/21/2027  . INFLUENZA VACCINE  Completed  . Hepatitis C Screening  Completed  . PNA vac Low Risk Adult  Completed     Discussed health benefits of physical activity, and encouraged her to engage in regular exercise appropriate for her age and condition.    ------------------------------------------------------------------------------------------------------------  Problem List Items Addressed This Visit      Endocrine   Adult hypothyroidism    Followed by ENT Repeat TSH Continue Synthroid at current dose      Relevant Orders   TSH     Other   Blood glucose elevated    Not actually diabetic as she has never had blood sugar A1c in diabetic range She was started many years ago on metformin by a naturopath, and wants to continue it We will continue low-dose Recheck A1c She does actually do her eye exam and microalbumin through insurance every year, even though she has not technically need these as she is not diabetic      Relevant Orders   Hemoglobin A1c   Hypertriglyceridemia    Recheck lipids Not currently on a statin      Relevant Orders   Comprehensive metabolic panel   Lipid panel   Avitaminosis D    Recheck vitamin  D level Continue current supplement      Relevant Orders   VITAMIN D 25 Hydroxy (Vit-D Deficiency, Fractures)    Other Visit Diagnoses    Encounter for annual physical exam    -  Primary   Relevant Orders   TSH   Comprehensive metabolic panel   Lipid panel   Hemoglobin A1c   VITAMIN D 25 Hydroxy (Vit-D Deficiency, Fractures)   Allergic rhinitis with postnasal drip           Return in about 1 year (around 01/17/2021) for CPE.   The entirety of the information documented in the History of Present Illness, Review of Systems and Physical Exam were personally obtained by me. Portions of this information were initially documented by Rondel Baton, CMA and reviewed by me for thoroughness and accuracy.    Charmelle Soh, Marzella Schlein, MD MPH [facility]

## 2020-01-18 NOTE — Assessment & Plan Note (Signed)
Recheck vitamin D level Continue current supplement 

## 2020-01-18 NOTE — Assessment & Plan Note (Signed)
Not actually diabetic as she has never had blood sugar A1c in diabetic range She was started many years ago on metformin by a naturopath, and wants to continue it We will continue low-dose Recheck A1c She does actually do her eye exam and microalbumin through insurance every year, even though she has not technically need these as she is not diabetic

## 2020-01-19 DIAGNOSIS — Z0000 Encounter for general adult medical examination without abnormal findings: Secondary | ICD-10-CM | POA: Diagnosis not present

## 2020-01-19 DIAGNOSIS — E039 Hypothyroidism, unspecified: Secondary | ICD-10-CM | POA: Diagnosis not present

## 2020-01-19 DIAGNOSIS — E781 Pure hyperglyceridemia: Secondary | ICD-10-CM | POA: Diagnosis not present

## 2020-01-19 DIAGNOSIS — R739 Hyperglycemia, unspecified: Secondary | ICD-10-CM | POA: Diagnosis not present

## 2020-01-20 LAB — HEMOGLOBIN A1C
Est. average glucose Bld gHb Est-mCnc: 111 mg/dL
Hgb A1c MFr Bld: 5.5 % (ref 4.8–5.6)

## 2020-01-20 LAB — LIPID PANEL
Chol/HDL Ratio: 3.7 ratio (ref 0.0–4.4)
Cholesterol, Total: 176 mg/dL (ref 100–199)
HDL: 48 mg/dL (ref >39)
LDL Chol Calc (NIH): 100 mg/dL — ABNORMAL HIGH (ref 0–99)
Triglycerides: 159 mg/dL — ABNORMAL HIGH (ref 0–149)
VLDL Cholesterol Cal: 28 mg/dL (ref 5–40)

## 2020-01-20 LAB — COMPREHENSIVE METABOLIC PANEL
ALT: 28 IU/L (ref 0–32)
AST: 24 IU/L (ref 0–40)
Albumin/Globulin Ratio: 1.7 (ref 1.2–2.2)
Albumin: 4.2 g/dL (ref 3.7–4.7)
Alkaline Phosphatase: 70 IU/L (ref 39–117)
BUN/Creatinine Ratio: 16 (ref 12–28)
BUN: 12 mg/dL (ref 8–27)
Bilirubin Total: 0.4 mg/dL (ref 0.0–1.2)
CO2: 22 mmol/L (ref 20–29)
Calcium: 9.3 mg/dL (ref 8.7–10.3)
Chloride: 104 mmol/L (ref 96–106)
Creatinine, Ser: 0.73 mg/dL (ref 0.57–1.00)
GFR calc Af Amer: 96 mL/min/{1.73_m2} (ref >59)
GFR calc non Af Amer: 83 mL/min/{1.73_m2} (ref >59)
Globulin, Total: 2.5 g/dL (ref 1.5–4.5)
Glucose: 108 mg/dL — ABNORMAL HIGH (ref 65–99)
Potassium: 4.6 mmol/L (ref 3.5–5.2)
Sodium: 140 mmol/L (ref 134–144)
Total Protein: 6.7 g/dL (ref 6.0–8.5)

## 2020-01-20 LAB — TSH: TSH: 0.247 u[IU]/mL — ABNORMAL LOW (ref 0.450–4.500)

## 2020-01-20 LAB — VITAMIN D 25 HYDROXY (VIT D DEFICIENCY, FRACTURES): Vit D, 25-Hydroxy: 84.7 ng/mL (ref 30.0–100.0)

## 2020-01-23 ENCOUNTER — Other Ambulatory Visit: Payer: Self-pay

## 2020-01-23 NOTE — Telephone Encounter (Signed)
-----   Message from Erasmo Downer, MD sent at 01/22/2020  1:12 PM EST ----- Normal kidney function, liver function, electrolytes, vitamin D level, A1c.  Low TSH, signifying that Synthroid dose may be too high.  Cholesterol is stable, but 10-year risk of heart disease/stroke is elevated 9.2%.  I would recommend a statin to lower heart disease/stroke risk.  Follow-up with ENT regarding this TSH.  If agreeable to starting a statin, okay to prescribe atorvastatin 20 mg daily.  Would recommend rechecking lipid panel and metabolic panel in about 3 months.

## 2020-01-23 NOTE — Telephone Encounter (Signed)
Patient advised as below. Patient is requesting to start Crestor 5mg  daily instead of Atorvastatin 20 mg. Please advise.

## 2020-01-24 MED ORDER — ROSUVASTATIN CALCIUM 5 MG PO TABS: 5.0000 mg | ORAL_TABLET | Freq: Every day | ORAL | 3 refills | Status: DC

## 2020-01-30 DIAGNOSIS — L272 Dermatitis due to ingested food: Secondary | ICD-10-CM | POA: Diagnosis not present

## 2020-01-30 DIAGNOSIS — R0982 Postnasal drip: Secondary | ICD-10-CM | POA: Diagnosis not present

## 2020-01-30 DIAGNOSIS — E890 Postprocedural hypothyroidism: Secondary | ICD-10-CM | POA: Diagnosis not present

## 2020-03-06 DIAGNOSIS — H01131 Eczematous dermatitis of right upper eyelid: Secondary | ICD-10-CM | POA: Diagnosis not present

## 2020-03-06 DIAGNOSIS — L814 Other melanin hyperpigmentation: Secondary | ICD-10-CM | POA: Diagnosis not present

## 2020-03-06 DIAGNOSIS — H01134 Eczematous dermatitis of left upper eyelid: Secondary | ICD-10-CM | POA: Diagnosis not present

## 2020-03-06 DIAGNOSIS — D0372 Melanoma in situ of left lower limb, including hip: Secondary | ICD-10-CM | POA: Diagnosis not present

## 2020-03-06 DIAGNOSIS — L400 Psoriasis vulgaris: Secondary | ICD-10-CM | POA: Diagnosis not present

## 2020-03-06 DIAGNOSIS — D0461 Carcinoma in situ of skin of right upper limb, including shoulder: Secondary | ICD-10-CM | POA: Diagnosis not present

## 2020-03-08 DIAGNOSIS — D485 Neoplasm of uncertain behavior of skin: Secondary | ICD-10-CM | POA: Diagnosis not present

## 2020-03-12 DIAGNOSIS — N951 Menopausal and female climacteric states: Secondary | ICD-10-CM | POA: Diagnosis not present

## 2020-03-12 DIAGNOSIS — R6882 Decreased libido: Secondary | ICD-10-CM | POA: Diagnosis not present

## 2020-03-12 DIAGNOSIS — Z72820 Sleep deprivation: Secondary | ICD-10-CM | POA: Diagnosis not present

## 2020-03-20 DIAGNOSIS — C439 Malignant melanoma of skin, unspecified: Secondary | ICD-10-CM | POA: Diagnosis not present

## 2020-03-22 DIAGNOSIS — D485 Neoplasm of uncertain behavior of skin: Secondary | ICD-10-CM | POA: Diagnosis not present

## 2020-03-26 DIAGNOSIS — D0372 Melanoma in situ of left lower limb, including hip: Secondary | ICD-10-CM | POA: Diagnosis not present

## 2020-03-26 DIAGNOSIS — C4372 Malignant melanoma of left lower limb, including hip: Secondary | ICD-10-CM | POA: Diagnosis not present

## 2020-03-28 ENCOUNTER — Other Ambulatory Visit: Payer: Self-pay | Admitting: Family Medicine

## 2020-04-03 DIAGNOSIS — D0461 Carcinoma in situ of skin of right upper limb, including shoulder: Secondary | ICD-10-CM | POA: Diagnosis not present

## 2020-04-08 DIAGNOSIS — D0372 Melanoma in situ of left lower limb, including hip: Secondary | ICD-10-CM | POA: Diagnosis not present

## 2020-04-08 DIAGNOSIS — Z09 Encounter for follow-up examination after completed treatment for conditions other than malignant neoplasm: Secondary | ICD-10-CM | POA: Diagnosis not present

## 2020-04-24 DIAGNOSIS — E890 Postprocedural hypothyroidism: Secondary | ICD-10-CM | POA: Diagnosis not present

## 2020-04-29 DIAGNOSIS — E890 Postprocedural hypothyroidism: Secondary | ICD-10-CM | POA: Diagnosis not present

## 2020-04-29 DIAGNOSIS — H6123 Impacted cerumen, bilateral: Secondary | ICD-10-CM | POA: Diagnosis not present

## 2020-04-29 DIAGNOSIS — D0372 Melanoma in situ of left lower limb, including hip: Secondary | ICD-10-CM | POA: Diagnosis not present

## 2020-04-29 DIAGNOSIS — Z09 Encounter for follow-up examination after completed treatment for conditions other than malignant neoplasm: Secondary | ICD-10-CM | POA: Diagnosis not present

## 2020-05-21 DIAGNOSIS — E039 Hypothyroidism, unspecified: Secondary | ICD-10-CM | POA: Diagnosis not present

## 2020-05-21 DIAGNOSIS — R5383 Other fatigue: Secondary | ICD-10-CM | POA: Diagnosis not present

## 2020-05-21 DIAGNOSIS — R6882 Decreased libido: Secondary | ICD-10-CM | POA: Diagnosis not present

## 2020-05-21 DIAGNOSIS — N951 Menopausal and female climacteric states: Secondary | ICD-10-CM | POA: Diagnosis not present

## 2020-05-23 DIAGNOSIS — N951 Menopausal and female climacteric states: Secondary | ICD-10-CM | POA: Diagnosis not present

## 2020-05-23 DIAGNOSIS — R6882 Decreased libido: Secondary | ICD-10-CM | POA: Diagnosis not present

## 2020-05-27 DIAGNOSIS — D0372 Melanoma in situ of left lower limb, including hip: Secondary | ICD-10-CM | POA: Diagnosis not present

## 2020-05-27 DIAGNOSIS — Z09 Encounter for follow-up examination after completed treatment for conditions other than malignant neoplasm: Secondary | ICD-10-CM | POA: Diagnosis not present

## 2020-07-01 DIAGNOSIS — D0372 Melanoma in situ of left lower limb, including hip: Secondary | ICD-10-CM | POA: Diagnosis not present

## 2020-07-01 DIAGNOSIS — Z09 Encounter for follow-up examination after completed treatment for conditions other than malignant neoplasm: Secondary | ICD-10-CM | POA: Diagnosis not present

## 2020-07-19 DIAGNOSIS — E559 Vitamin D deficiency, unspecified: Secondary | ICD-10-CM | POA: Diagnosis not present

## 2020-07-19 DIAGNOSIS — E039 Hypothyroidism, unspecified: Secondary | ICD-10-CM | POA: Diagnosis not present

## 2020-07-19 DIAGNOSIS — E890 Postprocedural hypothyroidism: Secondary | ICD-10-CM | POA: Diagnosis not present

## 2020-07-19 DIAGNOSIS — R7303 Prediabetes: Secondary | ICD-10-CM | POA: Diagnosis not present

## 2020-08-05 DIAGNOSIS — L814 Other melanin hyperpigmentation: Secondary | ICD-10-CM | POA: Diagnosis not present

## 2020-08-05 DIAGNOSIS — L720 Epidermal cyst: Secondary | ICD-10-CM | POA: Diagnosis not present

## 2020-08-05 DIAGNOSIS — L821 Other seborrheic keratosis: Secondary | ICD-10-CM | POA: Diagnosis not present

## 2020-08-05 DIAGNOSIS — L820 Inflamed seborrheic keratosis: Secondary | ICD-10-CM | POA: Diagnosis not present

## 2020-08-05 DIAGNOSIS — L400 Psoriasis vulgaris: Secondary | ICD-10-CM | POA: Diagnosis not present

## 2020-08-12 DIAGNOSIS — N951 Menopausal and female climacteric states: Secondary | ICD-10-CM | POA: Diagnosis not present

## 2020-08-12 DIAGNOSIS — E039 Hypothyroidism, unspecified: Secondary | ICD-10-CM | POA: Diagnosis not present

## 2020-09-27 DIAGNOSIS — H524 Presbyopia: Secondary | ICD-10-CM | POA: Diagnosis not present

## 2020-10-01 ENCOUNTER — Other Ambulatory Visit: Payer: Self-pay | Admitting: Family Medicine

## 2020-10-14 DIAGNOSIS — R5381 Other malaise: Secondary | ICD-10-CM | POA: Diagnosis not present

## 2020-10-14 DIAGNOSIS — E7800 Pure hypercholesterolemia, unspecified: Secondary | ICD-10-CM | POA: Diagnosis not present

## 2020-10-14 DIAGNOSIS — R5383 Other fatigue: Secondary | ICD-10-CM | POA: Diagnosis not present

## 2020-10-14 DIAGNOSIS — E559 Vitamin D deficiency, unspecified: Secondary | ICD-10-CM | POA: Diagnosis not present

## 2020-11-20 DIAGNOSIS — E039 Hypothyroidism, unspecified: Secondary | ICD-10-CM | POA: Diagnosis not present

## 2020-11-20 DIAGNOSIS — E3450 Androgen insensitivity syndrome, unspecified: Secondary | ICD-10-CM | POA: Diagnosis not present

## 2020-12-17 DIAGNOSIS — Z1231 Encounter for screening mammogram for malignant neoplasm of breast: Secondary | ICD-10-CM | POA: Diagnosis not present

## 2020-12-17 LAB — HM MAMMOGRAPHY

## 2021-01-02 DIAGNOSIS — N958 Other specified menopausal and perimenopausal disorders: Secondary | ICD-10-CM | POA: Diagnosis not present

## 2021-01-02 DIAGNOSIS — E038 Other specified hypothyroidism: Secondary | ICD-10-CM | POA: Diagnosis not present

## 2021-01-06 DIAGNOSIS — N958 Other specified menopausal and perimenopausal disorders: Secondary | ICD-10-CM | POA: Diagnosis not present

## 2021-01-06 DIAGNOSIS — R6882 Decreased libido: Secondary | ICD-10-CM | POA: Diagnosis not present

## 2021-01-06 DIAGNOSIS — E038 Other specified hypothyroidism: Secondary | ICD-10-CM | POA: Diagnosis not present

## 2021-01-06 DIAGNOSIS — R5383 Other fatigue: Secondary | ICD-10-CM | POA: Diagnosis not present

## 2021-01-08 NOTE — Progress Notes (Signed)
Subjective:   Jodi Day is a 75 y.o. female who presents for Medicare Annual (Subsequent) preventive examination.  I connected with Jodi Day today by telephone and verified that I am speaking with the correct person using two identifiers. Location patient: home Location provider: work Persons participating in the virtual visit: patient, provider.   I discussed the limitations, risks, security and privacy concerns of performing an evaluation and management service by telephone and the availability of in person appointments. I also discussed with the patient that there may be a patient responsible charge related to this service. The patient expressed understanding and verbally consented to this telephonic visit.    Interactive audio and video telecommunications were attempted between this provider and patient, however failed, due to patient having technical difficulties OR patient did not have access to video capability.  We continued and completed visit with audio only.   Review of Systems    N/A  Cardiac Risk Factors include: advanced age (>38men, >57 women);diabetes mellitus;dyslipidemia     Objective:    There were no vitals filed for this visit. There is no height or weight on file to calculate BMI.  Advanced Directives 01/09/2021 01/08/2020 11/02/2018 10/19/2018 09/23/2018 09/22/2017 04/16/2016  Does Patient Have a Medical Advance Directive? No No No No No No No  Does patient want to make changes to medical advance directive? - - - - - No - Patient declined -  Would patient like information on creating a medical advance directive? No - Patient declined No - Patient declined No - Patient declined No - Patient declined No - Patient declined - -    Current Medications (verified) Outpatient Encounter Medications as of 01/09/2021  Medication Sig  . b complex vitamins tablet Take 1 tablet by mouth daily.  . clindamycin (CLEOCIN) 300 MG capsule Take 600 mg by mouth as directed. 1  hour prior to dental procedures  . DHEA 10 MG TABS Take 10 mg by mouth daily.  Marland Kitchen liothyronine (CYTOMEL) 5 MCG tablet Take 5 mcg by mouth daily.  . metFORMIN (GLUCOPHAGE-XR) 500 MG 24 hr tablet TAKE ONE TABLET EVERY DAY BEFORE BREAKFAST  . Multiple Vitamins-Minerals (ZINC PO) Take by mouth daily.  . NON FORMULARY Take 1 capsule by mouth daily at 6 (six) AM. "Eye Sight"  . OVER THE COUNTER MEDICATION Take 12.5 mg by mouth daily. Iodine Complex  . PHOSPHATIDYLSERINE PO Take 300 mg by mouth daily.  Marland Kitchen PRESCRIPTION MEDICATION Place 1 application rectally every 4 (four) months. Hormone replacement therapy pellet inserted rectally every 4 months. Dosing depends on blood work  . progesterone (PROMETRIUM) 200 MG capsule Take 200 mg by mouth daily.   . rosuvastatin (CRESTOR) 5 MG tablet Take 1 tablet (5 mg total) by mouth daily.  Marland Kitchen SYNTHROID 125 MCG tablet Take 125 mcg by mouth daily before breakfast.   . TURMERIC PO Take 750 mg by mouth daily.   Marland Kitchen VITAMIN D, CHOLECALCIFEROL, PO Take 10,000 Units by mouth daily.   Marland Kitchen VITAMIN K PO Take 30.5 mg by mouth daily.   Marland Kitchen albuterol (PROVENTIL HFA;VENTOLIN HFA) 108 (90 Base) MCG/ACT inhaler Inhale 2 puffs into the lungs every 6 (six) hours as needed for wheezing or shortness of breath. (Patient not taking: No sig reported)   No facility-administered encounter medications on file as of 01/09/2021.    Allergies (verified) Penicillins   History: Past Medical History:  Diagnosis Date  . Anxiety   . Diabetes mellitus without complication (HCC)   .  Goiter, non-toxic 07/04/2007  . Hyperlipidemia   . Hypothyroidism   . Thyroid disease    Past Surgical History:  Procedure Laterality Date  . ABDOMINAL HYSTERECTOMY  1992   fibroids. ovaries intact  . CHOLECYSTECTOMY  1996  . EYE SURGERY Bilateral 2014   cataract surgery  . KNEE ARTHROPLASTY Right 11/02/2018   Procedure: COMPUTER ASSISTED TOTAL KNEE ARTHROPLASTY;  Surgeon: Donato Heinz, MD;  Location: [facility]  ORS;  Service: Orthopedics;  Laterality: Right;  . thyroidectomy  1973  . tonsillectomy and adenoidectomy  1973   Family History  Problem Relation Age of Onset  . Diabetes Mother   . Hypothyroidism Mother   . Stroke Mother   . COPD Father   . Prostate cancer Brother   . Pneumonia Brother        spesis from pneumonia  . Prostate cancer Brother   . Colon polyps Brother   . Parkinson's disease Brother   . Cancer Brother        lung cancer   Social History   Socioeconomic History  . Marital status: Married    Spouse name: Thereasa Distance  . Number of children: 2  . Years of education: Associates  . Highest education level: Associate degree: occupational, Scientist, product/process development, or vocational program  Occupational History  . Occupation: Self Employeed owns a Research officer, political party business    Comment: retired  Tobacco Use  . Smoking status: Former Smoker    Packs/day: 1.00    Years: 7.00    Pack years: 7.00    Quit date: 11/15/1977    Years since quitting: 43.1  . Smokeless tobacco: Never Used  Vaping Use  . Vaping Use: Never used  Substance and Sexual Activity  . Alcohol use: Yes    Alcohol/week: 1.0 standard drink    Types: 1 Glasses of wine per week  . Drug use: No  . Sexual activity: Yes  Other Topics Concern  . Not on file  Social History Narrative  . Not on file   Social Determinants of Health   Financial Resource Strain: Low Risk   . Difficulty of Paying Living Expenses: Not hard at all  Food Insecurity: No Food Insecurity  . Worried About Programme researcher, broadcasting/film/video in the Last Year: Never true  . Ran Out of Food in the Last Year: Never true  Transportation Needs: No Transportation Needs  . Lack of Transportation (Medical): No  . Lack of Transportation (Non-Medical): No  Physical Activity: Sufficiently Active  . Days of Exercise per Week: 3 days  . Minutes of Exercise per Session: 60 min  Stress: No Stress Concern Present  . Feeling of Stress : Not at all  Social Connections: Moderately  Isolated  . Frequency of Communication with Friends and Family: More than three times a week  . Frequency of Social Gatherings with Friends and Family: More than three times a week  . Attends Religious Services: Never  . Active Member of Clubs or Organizations: No  . Attends Banker Meetings: Never  . Marital Status: Married    Tobacco Counseling Counseling given: Not Answered   Clinical Intake:  Pre-visit preparation completed: Yes  Pain : No/denies pain     Nutritional Risks: None Diabetes:  (Prediabetic)  How often do you need to have someone help you when you read instructions, pamphlets, or other written materials from your doctor or pharmacy?: 1 - Never  Diabetic? Pre-diabetic  Interpreter Needed?: No  Information entered by :: [facility], LPN  Activities of Daily Living In your present state of health, do you have any difficulty performing the following activities: 01/09/2021  Hearing? N  Vision? N  Difficulty concentrating or making decisions? N  Walking or climbing stairs? N  Dressing or bathing? N  Doing errands, shopping? N  Preparing Food and eating ? N  Using the Toilet? N  In the past six months, have you accidently leaked urine? N  Do you have problems with loss of bowel control? N  Managing your Medications? N  Managing your Finances? N  Housekeeping or managing your Housekeeping? N  Some recent data might be hidden    Patient Care Team: Erasmo Downer, MD as PCP - General (Family Medicine) Vernie Murders, MD (Otolaryngology) System, Provider Not In (Ophthalmology) System, Provider Not In (Dermatology)  Indicate any recent Medical Services you may have received from other than [facility] providers in the past year (date may be approximate).     Assessment:   This is a routine wellness examination for Roza.  Hearing/Vision screen No exam data present  Dietary issues and exercise activities discussed: Current Exercise  Habits: Home exercise routine;Structured exercise class, Type of exercise: walking;Other - see comments (water aerobics), Time (Minutes): 60, Frequency (Times/Week): 3, Weekly Exercise (Minutes/Week): 180, Intensity: Mild, Exercise limited by: None identified  Goals    . DIET - REDUCE CALORIE INTAKE     Recommend to cut back on carbohydrates in daily diet (avoid breads).       Depression Screen PHQ 2/9 Scores 01/09/2021 01/08/2020 01/05/2019 09/23/2018 09/22/2017 09/22/2017 04/16/2016  PHQ - 2 Score 0 0 0 0 0 0 0  PHQ- 9 Score - - 1 - 2 - -    Fall Risk Fall Risk  01/09/2021 01/08/2020 01/05/2019 09/23/2018 09/22/2017  Falls in the past year? 0 0 0 0 No  Number falls in past yr: 0 0 - - -  Injury with Fall? 0 0 - - -    FALL RISK PREVENTION PERTAINING TO THE HOME:  Any stairs in or around the home? Yes  If so, are there any without handrails? No  Home free of loose throw rugs in walkways, pet beds, electrical cords, etc? Yes  Adequate lighting in your home to reduce risk of falls? Yes   ASSISTIVE DEVICES UTILIZED TO PREVENT FALLS:  Life alert? No  Use of a cane, walker or w/c? No  Grab bars in the bathroom? No  Shower chair or bench in shower? Yes  Elevated toilet seat or a handicapped toilet? Yes    Cognitive Function: Normal cognitive status assessed by observation by this Nurse Health Advisor. No abnormalities found.       6CIT Screen 01/08/2020 09/22/2017  What Year? 0 points 0 points  What month? 0 points 0 points  What time? 0 points 0 points  Count back from 20 0 points 0 points  Months in reverse 2 points 2 points  Repeat phrase 0 points 0 points  Total Score 2 2    Immunizations Immunization History  Administered Date(s) Administered  . Fluad Quad(high Dose 65+) 07/25/2019  . Influenza, High Dose Seasonal PF 07/30/2020  . Influenza-Unspecified 08/15/2018  . PFIZER(Purple Top)SARS-COV-2 Vaccination 12/07/2019, 12/25/2019, 08/17/2020  . Pneumococcal Conjugate-13  01/08/2015  . Pneumococcal Polysaccharide-23 01/15/2016  . Td 10/20/2017  . Tdap 07/01/2007  . Zoster 01/04/2009  . Zoster Recombinat (Shingrix) 12/27/2017, 02/28/2018    TDAP status: Up to date  Flu Vaccine status: Up to date  Pneumococcal vaccine  status: Up to date  Covid-19 vaccine status: Completed vaccines  Qualifies for Shingles Vaccine? Yes   Zostavax completed Yes   Shingrix Completed?: Yes  Screening Tests Health Maintenance  Topic Date Due  . COVID-19 Vaccine (4 - Booster for Pfizer series) 02/15/2021  . MAMMOGRAM  12/17/2021  . DEXA SCAN  12/01/2022  . COLONOSCOPY (Pts 45-466yrs Insurance coverage will need to be confirmed)  01/04/2023  . TETANUS/TDAP  10/21/2027  . INFLUENZA VACCINE  Completed  . Hepatitis C Screening  Completed  . PNA vac Low Risk Adult  Completed    Health Maintenance  There are no preventive care reminders to display for this patient.  Colorectal cancer screening: Type of screening: Colonoscopy. Completed 01/04/18. Repeat every 5 years  Mammogram status: Completed 12/17/20. Repeat every year  Bone Density status: Completed 12/01/17. Results reflect: Bone density results: NORMAL. Repeat every 5 years.  Lung Cancer Screening: (Low Dose CT Chest recommended if Age 7-80 years, 30 pack-year currently smoking OR have quit w/in 15years.) does not qualify.   Additional Screening:  Hepatitis C Screening: Up to date  Vision Screening: Recommended annual ophthalmology exams for early detection of glaucoma and other disorders of the eye. Is the patient up to date with their annual eye exam?  Yes  Who is the provider or what is the name of the office in which the patient attends annual eye exams? [facility] [place] If pt is not established with a provider, would they like to be referred to a provider to establish care? No .   Dental Screening: Recommended annual dental exams for proper oral hygiene  Community Resource Referral /  Chronic Care Management: CRR required this visit?  No   CCM required this visit?  No      Plan:     I have personally reviewed and noted the following in the patient's chart:   . Medical and social history . Use of alcohol, tobacco or illicit drugs  . Current medications and supplements . Functional ability and status . Nutritional status . Physical activity . Advanced directives . List of other physicians . Hospitalizations, surgeries, and ER visits in previous 12 months . Vitals . Screenings to include cognitive, depression, and falls . Referrals and appointments  In addition, I have reviewed and discussed with patient certain preventive protocols, quality metrics, and best practice recommendations. A written personalized care plan for preventive services as well as general preventive health recommendations were provided to patient.     Deardra Hinkley Powers[place]ki, [place]N   1/61/09602/24/2022   Nurse Notes: None.

## 2021-01-09 ENCOUNTER — Ambulatory Visit (INDEPENDENT_AMBULATORY_CARE_PROVIDER_SITE_OTHER): Payer: Medicare Other

## 2021-01-09 ENCOUNTER — Other Ambulatory Visit: Payer: Self-pay

## 2021-01-09 DIAGNOSIS — Z0000 Encounter for general adult medical examination without abnormal findings: Secondary | ICD-10-CM

## 2021-01-09 NOTE — Patient Instructions (Signed)
[name] , Thank you for taking time to come for your Medicare Wellness Visit. I appreciate your ongoing commitment to your health goals. Please review the following plan we discussed and let me know if I can assist you in the future.   Screening recommendations/referrals: Colonoscopy: Up to date, due 12/2022 Mammogram: Up to date, due 12/2021 Bone Density: Up to date, due 11/2022 Recommended yearly ophthalmology/optometry visit for glaucoma screening and checkup Recommended yearly dental visit for hygiene and checkup  Vaccinations: Influenza vaccine: Done 07/30/20 Pneumococcal vaccine: Completed series Tdap vaccine: Up to date, due 10/2027 Shingles vaccine: Shingrix discussed. Please contact your pharmacy for coverage information.     Advanced directives: Please bring a copy of your POA (Power of Attorney) and/or Living Will to your next appointment.   Conditions/risks identified: Recommend to cut back on carbohydrates in daily diet (avoid breads).   Next appointment: 01/20/21 @ 2:00 PM with Dr Beryle Flock    Preventive Care 65 Years and Older, Female Preventive care refers to lifestyle choices and visits with your health care provider that can promote health and wellness. What does preventive care include?  A yearly physical exam. This is also called an annual well check.  Dental exams once or twice a year.  Routine eye exams. Ask your health care provider how often you should have your eyes checked.  Personal lifestyle choices, including:  Daily care of your teeth and gums.  Regular physical activity.  Eating a healthy diet.  Avoiding tobacco and drug use.  Limiting alcohol use.  Practicing safe sex.  Taking low-dose aspirin every day.  Taking vitamin and mineral supplements as recommended by your health care provider. What happens during an annual well check? The services and screenings done by your health care provider during your annual well check will depend on  your age, overall health, lifestyle risk factors, and family history of disease. Counseling  Your health care provider may ask you questions about your:  Alcohol use.  Tobacco use.  Drug use.  Emotional well-being.  Home and relationship well-being.  Sexual activity.  Eating habits.  History of falls.  Memory and ability to understand (cognition).  Work and work Astronomer.  Reproductive health. Screening  You may have the following tests or measurements:  Height, weight, and BMI.  Blood pressure.  Lipid and cholesterol levels. These may be checked every 5 years, or more frequently if you are over 36 years old.  Skin check.  Lung cancer screening. You may have this screening every year starting at age 22 if you have a 30-pack-year history of smoking and currently smoke or have quit within the past 15 years.  Fecal occult blood test (FOBT) of the stool. You may have this test every year starting at age 51.  Flexible sigmoidoscopy or colonoscopy. You may have a sigmoidoscopy every 5 years or a colonoscopy every 10 years starting at age 67.  Hepatitis C blood test.  Hepatitis B blood test.  Sexually transmitted disease (STD) testing.  Diabetes screening. This is done by checking your blood sugar (glucose) after you have not eaten for a while (fasting). You may have this done every 1-3 years.  Bone density scan. This is done to screen for osteoporosis. You may have this done starting at age 58.  Mammogram. This may be done every 1-2 years. Talk to your health care provider about how often you should have regular mammograms. Talk with your health care provider about your test results, treatment options, and  if necessary, the need for more tests. Vaccines  Your health care provider may recommend certain vaccines, such as:  Influenza vaccine. This is recommended every year.  Tetanus, diphtheria, and acellular pertussis (Tdap, Td) vaccine. You may need a Td booster  every 10 years.  Zoster vaccine. You may need this after age 68.  Pneumococcal 13-valent conjugate (PCV13) vaccine. One dose is recommended after age 71.  Pneumococcal polysaccharide (PPSV23) vaccine. One dose is recommended after age 26. Talk to your health care provider about which screenings and vaccines you need and how often you need them. This information is not intended to replace advice given to you by your health care provider. Make sure you discuss any questions you have with your health care provider. Document Released: 11/29/2015 Document Revised: 07/22/2016 Document Reviewed: 09/03/2015 Elsevier Interactive Patient Education  2017 [place] Prevention in the Home Falls can cause injuries. They can happen to people of all ages. There are many things you can do to make your home safe and to help prevent falls. What can I do on the outside of my home?  Regularly fix the edges of walkways and driveways and fix any cracks.  Remove anything that might make you trip as you walk through a door, such as a raised step or threshold.  Trim any bushes or trees on the path to your home.  Use bright outdoor lighting.  Clear any walking paths of anything that might make someone trip, such as rocks or tools.  Regularly check to see if handrails are loose or broken. Make sure that both sides of any steps have handrails.  Any raised decks and porches should have guardrails on the edges.  Have any leaves, snow, or ice cleared regularly.  Use sand or salt on walking paths during winter.  Clean up any spills in your garage right away. This includes oil or grease spills. What can I do in the bathroom?  Use night lights.  Install grab bars by the toilet and in the tub and shower. Do not use towel bars as grab bars.  Use non-skid mats or decals in the tub or shower.  If you need to sit down in the shower, use a plastic, non-slip stool.  Keep the floor dry. Clean up any  water that spills on the floor as soon as it happens.  Remove soap buildup in the tub or shower regularly.  Attach bath mats securely with double-sided non-slip rug tape.  Do not have throw rugs and other things on the floor that can make you trip. What can I do in the bedroom?  Use night lights.  Make sure that you have a light by your bed that is easy to reach.  Do not use any sheets or blankets that are too big for your bed. They should not hang down onto the floor.  Have a firm chair that has side arms. You can use this for support while you get dressed.  Do not have throw rugs and other things on the floor that can make you trip. What can I do in the kitchen?  Clean up any spills right away.  Avoid walking on wet floors.  Keep items that you use a lot in easy-to-reach places.  If you need to reach something above you, use a strong step stool that has a grab bar.  Keep electrical cords out of the way.  Do not use floor polish or wax that makes floors slippery. If you  must use wax, use non-skid floor wax.  Do not have throw rugs and other things on the floor that can make you trip. What can I do with my stairs?  Do not leave any items on the stairs.  Make sure that there are handrails on both sides of the stairs and use them. Fix handrails that are broken or loose. Make sure that handrails are as long as the stairways.  Check any carpeting to make sure that it is firmly attached to the stairs. Fix any carpet that is loose or worn.  Avoid having throw rugs at the top or bottom of the stairs. If you do have throw rugs, attach them to the floor with carpet tape.  Make sure that you have a light switch at the top of the stairs and the bottom of the stairs. If you do not have them, ask someone to add them for you. What else can I do to help prevent falls?  Wear shoes that:  Do not have high heels.  Have rubber bottoms.  Are comfortable and fit you well.  Are closed  at the toe. Do not wear sandals.  If you use a stepladder:  Make sure that it is fully opened. Do not climb a closed stepladder.  Make sure that both sides of the stepladder are locked into place.  Ask someone to hold it for you, if possible.  Clearly mark and make sure that you can see:  Any grab bars or handrails.  First and last steps.  Where the edge of each step is.  Use tools that help you move around (mobility aids) if they are needed. These include:  Canes.  Walkers.  Scooters.  Crutches.  Turn on the lights when you go into a dark area. Replace any light bulbs as soon as they burn out.  Set up your furniture so you have a clear path. Avoid moving your furniture around.  If any of your floors are uneven, fix them.  If there are any pets around you, be aware of where they are.  Review your medicines with your doctor. Some medicines can make you feel dizzy. This can increase your chance of falling. Ask your doctor what other things that you can do to help prevent falls. This information is not intended to replace advice given to you by your health care provider. Make sure you discuss any questions you have with your health care provider. Document Released: 08/29/2009 Document Revised: 04/09/2016 Document Reviewed: 12/07/2014 Elsevier Interactive Patient Education  2017 [employer].

## 2021-01-13 DIAGNOSIS — L400 Psoriasis vulgaris: Secondary | ICD-10-CM | POA: Diagnosis not present

## 2021-01-13 DIAGNOSIS — L814 Other melanin hyperpigmentation: Secondary | ICD-10-CM | POA: Diagnosis not present

## 2021-01-13 DIAGNOSIS — L720 Epidermal cyst: Secondary | ICD-10-CM | POA: Diagnosis not present

## 2021-01-13 DIAGNOSIS — L821 Other seborrheic keratosis: Secondary | ICD-10-CM | POA: Diagnosis not present

## 2021-01-20 ENCOUNTER — Ambulatory Visit (INDEPENDENT_AMBULATORY_CARE_PROVIDER_SITE_OTHER): Payer: Medicare Other | Admitting: Family Medicine

## 2021-01-20 ENCOUNTER — Encounter: Payer: Self-pay | Admitting: Family Medicine

## 2021-01-20 ENCOUNTER — Other Ambulatory Visit: Payer: Self-pay

## 2021-01-20 VITALS — BP 132/68 | HR 70 | Temp 98.1°F | Resp 16 | Ht 69.5 in | Wt 188.0 lb

## 2021-01-20 DIAGNOSIS — E039 Hypothyroidism, unspecified: Secondary | ICD-10-CM | POA: Diagnosis not present

## 2021-01-20 DIAGNOSIS — Z0000 Encounter for general adult medical examination without abnormal findings: Secondary | ICD-10-CM | POA: Diagnosis not present

## 2021-01-20 DIAGNOSIS — R739 Hyperglycemia, unspecified: Secondary | ICD-10-CM

## 2021-01-20 DIAGNOSIS — E559 Vitamin D deficiency, unspecified: Secondary | ICD-10-CM | POA: Diagnosis not present

## 2021-01-20 DIAGNOSIS — E781 Pure hyperglyceridemia: Secondary | ICD-10-CM

## 2021-01-20 MED ORDER — METFORMIN HCL ER 500 MG PO TB24: ORAL_TABLET | ORAL | 3 refills | Status: DC

## 2021-01-20 MED ORDER — ROSUVASTATIN CALCIUM 5 MG PO TABS: 5.0000 mg | ORAL_TABLET | Freq: Every day | ORAL | 3 refills | Status: DC

## 2021-01-20 NOTE — Progress Notes (Signed)
Complete physical exam   Patient: Jodi Day   DOB: [birth year]   75 y.o. Female  MRN: [id number] Visit Date: 01/20/2021  Today's healthcare provider: Shirlee Latch, MD   Chief Complaint  Patient presents with  . Annual Exam   Subjective    Jodi Day is a 75 y.o. female who presents today for a complete physical exam.  She reports consuming a general diet. Gym/ health club routine includes jogging on track  and swimming. She generally feels well. She reports sleeping well. She does not have additional problems to discuss today.   HPI  01/09/2021 AWV 12/17/2020 Mammogram-BI-RADS 1 12/01/2017 BMD-normal 01/04/2018 Colonoscopy-polyps  Past Medical History:  Diagnosis Date  . Anxiety   . Diabetes mellitus without complication (HCC)   . Goiter, non-toxic 07/04/2007  . Hyperlipidemia   . Hypothyroidism   . Thyroid disease    Past Surgical History:  Procedure Laterality Date  . ABDOMINAL HYSTERECTOMY  1992   fibroids. ovaries intact  . CHOLECYSTECTOMY  1996  . EYE SURGERY Bilateral 2014   cataract surgery  . KNEE ARTHROPLASTY Right 11/02/2018   Procedure: COMPUTER ASSISTED TOTAL KNEE ARTHROPLASTY;  Surgeon: Donato Heinz, MD;  Location: [facility] ORS;  Service: Orthopedics;  Laterality: Right;  . thyroidectomy  1973  . tonsillectomy and adenoidectomy  1973   Social History   Socioeconomic History  . Marital status: Married    Spouse name: Thereasa Distance  . Number of children: 2  . Years of education: Associates  . Highest education level: Associate degree: occupational, Scientist, product/process development, or vocational program  Occupational History  . Occupation: Self Employeed owns a Research officer, political party business    Comment: retired  Tobacco Use  . Smoking status: Former Smoker    Packs/day: 1.00    Years: 7.00    Pack years: 7.00    Quit date: 11/15/1977    Years since quitting: 43.2  . Smokeless tobacco: Never Used  Vaping Use  . Vaping Use: Never used  Substance and Sexual Activity   . Alcohol use: Yes    Alcohol/week: 1.0 standard drink    Types: 1 Glasses of wine per week  . Drug use: No  . Sexual activity: Yes  Other Topics Concern  . Not on file  Social History Narrative  . Not on file   Social Determinants of Health   Financial Resource Strain: Low Risk   . Difficulty of Paying Living Expenses: Not hard at all  Food Insecurity: No Food Insecurity  . Worried About Programme researcher, broadcasting/film/video in the Last Year: Never true  . Ran Out of Food in the Last Year: Never true  Transportation Needs: No Transportation Needs  . Lack of Transportation (Medical): No  . Lack of Transportation (Non-Medical): No  Physical Activity: Sufficiently Active  . Days of Exercise per Week: 3 days  . Minutes of Exercise per Session: 60 min  Stress: No Stress Concern Present  . Feeling of Stress : Not at all  Social Connections: Moderately Isolated  . Frequency of Communication with Friends and Family: More than three times a week  . Frequency of Social Gatherings with Friends and Family: More than three times a week  . Attends Religious Services: Never  . Active Member of Clubs or Organizations: No  . Attends Banker Meetings: Never  . Marital Status: Married  Catering manager Violence: Not At Risk  . Fear of Current or Ex-Partner: No  . Emotionally Abused: No  .  Physically Abused: No  . Sexually Abused: No   Family Status  Relation Name Status  . Mother  Deceased at age 79  . Father  Deceased at age 43       COPD, tobacco abuse  . Brother  Deceased at age 56       pneumonia  . Brother  Alive  . Brother  Alive   Family History  Problem Relation Age of Onset  . Diabetes Mother   . Hypothyroidism Mother   . Stroke Mother   . COPD Father   . Prostate cancer Brother   . Pneumonia Brother        spesis from pneumonia  . Prostate cancer Brother   . Colon polyps Brother   . Parkinson's disease Brother   . Cancer Brother        lung cancer   Allergies   Allergen Reactions  . Penicillins Other (See Comments)    Unknown, per pt's mother. Has patient had a PCN reaction causing immediate rash, facial/tongue/throat swelling, SOB or lightheadedness with hypotension: Unknown Has patient had a PCN reaction causing severe rash involving mucus membranes or skin necrosis: Unknown Has patient had a PCN reaction that required hospitalization: No Has patient had a PCN reaction occurring within the last 10 years: No If all of the above answers are "NO", then may proceed with Cephalosporin use.     Patient Care Team: Erasmo Downer, MD as PCP - General (Family Medicine) Vernie Murders, MD (Otolaryngology) System, Provider Not In (Ophthalmology) System, Provider Not In (Dermatology)   Medications: Outpatient Medications Prior to Visit  Medication Sig  . b complex vitamins tablet Take 1 tablet by mouth daily.  Marland Kitchen co-enzyme Q-10 30 MG capsule Take 100 mg by mouth daily.  Marland Kitchen DHEA 10 MG TABS Take 10 mg by mouth daily.  Marland Kitchen liothyronine (CYTOMEL) 5 MCG tablet Take 5 mcg by mouth daily.  . Multiple Vitamins-Minerals (ZINC PO) Take by mouth daily.  . NON FORMULARY Take 1 capsule by mouth daily at 6 (six) AM. "Eye Sight"  . OVER THE COUNTER MEDICATION Take 12.5 mg by mouth daily. Iodine Complex  . PHOSPHATIDYLSERINE PO Take 300 mg by mouth daily.  Marland Kitchen PRESCRIPTION MEDICATION Place 1 application rectally every 4 (four) months. Hormone replacement therapy pellet inserted rectally every 4 months. Dosing depends on blood work  . progesterone (PROMETRIUM) 200 MG capsule Take 200 mg by mouth daily.   Marland Kitchen SYNTHROID 125 MCG tablet Take 125 mcg by mouth daily before breakfast.   . TURMERIC PO Take 750 mg by mouth daily.   Marland Kitchen VITAMIN D, CHOLECALCIFEROL, PO Take 10,000 Units by mouth daily.   Marland Kitchen VITAMIN K PO Take 30.5 mg by mouth daily.   . [DISCONTINUED] metFORMIN (GLUCOPHAGE-XR) 500 MG 24 hr tablet TAKE ONE TABLET EVERY DAY BEFORE BREAKFAST  . [DISCONTINUED]  rosuvastatin (CRESTOR) 5 MG tablet Take 1 tablet (5 mg total) by mouth daily.  Marland Kitchen albuterol (PROVENTIL HFA;VENTOLIN HFA) 108 (90 Base) MCG/ACT inhaler Inhale 2 puffs into the lungs every 6 (six) hours as needed for wheezing or shortness of breath. (Patient not taking: No sig reported)  . clindamycin (CLEOCIN) 300 MG capsule Take 600 mg by mouth as directed. 1 hour prior to dental procedures (Patient not taking: Reported on 01/20/2021)   No facility-administered medications prior to visit.    Review of Systems  Constitutional: Negative.   HENT: Negative.   Eyes: Negative.   Respiratory: Negative.   Cardiovascular: Negative.  Gastrointestinal: Negative.   Endocrine: Negative.   Genitourinary: Negative.   Musculoskeletal: Negative.   Skin: Negative.   Allergic/Immunologic: Negative.   Neurological: Negative.   Hematological: Negative.   Psychiatric/Behavioral: Negative.     Last CBC Lab Results  Component Value Date   WBC 7.6 01/05/2019   HGB 13.8 01/05/2019   HCT 41.1 01/05/2019   MCV 88 01/05/2019   MCH 29.6 01/05/2019   RDW 12.3 01/05/2019   PLT 338 01/05/2019   Last metabolic panel Lab Results  Component Value Date   GLUCOSE 108 (H) 01/19/2020   NA 140 01/19/2020   K 4.6 01/19/2020   CL 104 01/19/2020   CO2 22 01/19/2020   BUN 12 01/19/2020   CREATININE 0.73 01/19/2020   GFRNONAA 83 01/19/2020   GFRAA 96 01/19/2020   CALCIUM 9.3 01/19/2020   PROT 6.7 01/19/2020   ALBUMIN 4.2 01/19/2020   LABGLOB 2.5 01/19/2020   AGRATIO 1.7 01/19/2020   BILITOT 0.4 01/19/2020   ALKPHOS 70 01/19/2020   AST 24 01/19/2020   ALT 28 01/19/2020   ANIONGAP 8 10/19/2018   Last lipids Lab Results  Component Value Date   CHOL 176 01/19/2020   HDL 48 01/19/2020   LDLCALC 100 (H) 01/19/2020   TRIG 159 (H) 01/19/2020   CHOLHDL 3.7 01/19/2020   Last hemoglobin A1c Lab Results  Component Value Date   HGBA1C 5.5 01/19/2020   Last thyroid functions Lab Results  Component Value  Date   TSH 0.247 (L) 01/19/2020      Objective    BP 132/68 (BP Location: Left Arm, Patient Position: Sitting, Cuff Size: Large)   Pulse 70   Temp 98.1 F (36.7 C) (Oral)   Resp 16   Ht 5' 9.5" (1.765 m)   Wt 188 lb (85.3 kg)   SpO2 97%   BMI 27.36 kg/m  BP Readings from Last 3 Encounters:  01/20/21 132/68  01/18/20 126/73  01/05/19 127/79   Wt Readings from Last 3 Encounters:  01/20/21 188 lb (85.3 kg)  01/18/20 193 lb (87.5 kg)  01/05/19 186 lb (84.4 kg)      Physical Exam Vitals reviewed.  Constitutional:      General: She is not in acute distress.    Appearance: Normal appearance. She is well-developed. She is not diaphoretic.  HENT:     Head: Normocephalic and atraumatic.     Right Ear: Tympanic membrane, ear canal and external ear normal.     Left Ear: Tympanic membrane, ear canal and external ear normal.     Nose: Nose normal.     Mouth/Throat:     Mouth: Mucous membranes are moist.     Pharynx: Oropharynx is clear. No oropharyngeal exudate.  Eyes:     General: No scleral icterus.    Conjunctiva/sclera: Conjunctivae normal.     Pupils: Pupils are equal, round, and reactive to light.  Neck:     Thyroid: No thyromegaly.  Cardiovascular:     Rate and Rhythm: Normal rate and regular rhythm.     Pulses: Normal pulses.     Heart sounds: Normal heart sounds. No murmur heard.   Pulmonary:     Effort: Pulmonary effort is normal. No respiratory distress.     Breath sounds: Normal breath sounds. No wheezing or rales.  Abdominal:     General: There is no distension.     Palpations: Abdomen is soft.     Tenderness: There is no abdominal tenderness.  Musculoskeletal:  General: No deformity.     Cervical back: Neck supple.     Right lower leg: No edema.     Left lower leg: No edema.  Lymphadenopathy:     Cervical: No cervical adenopathy.  Skin:    General: Skin is warm and dry.     Findings: No rash.  Neurological:     Mental Status: She is alert  and oriented to person, place, and time. Mental status is at baseline.     Sensory: No sensory deficit.     Motor: No weakness.     Gait: Gait normal.  Psychiatric:        Mood and Affect: Mood normal.        Behavior: Behavior normal.        Thought Content: Thought content normal.       Last depression screening scores PHQ 2/9 Scores 01/20/2021 01/09/2021 01/08/2020  PHQ - 2 Score 0 0 0  PHQ- 9 Score 0 - -   Last fall risk screening Fall Risk  01/09/2021  Falls in the past year? 0  Number falls in past yr: 0  Injury with Fall? 0   Last Audit-C alcohol use screening Alcohol Use Disorder Test (AUDIT) 01/09/2021  1. How often do you have a drink containing alcohol? 2  2. How many drinks containing alcohol do you have on a typical day when you are drinking? 0  3. How often do you have six or more drinks on one occasion? 0  AUDIT-C Score 2  Alcohol Brief Interventions/Follow-up AUDIT Score <7 follow-up not indicated   A score of 3 or more in women, and 4 or more in men indicates increased risk for alcohol abuse, EXCEPT if all of the points are from question 1   No results found for any visits on 01/20/21.  Assessment & Plan    Routine Health Maintenance and Physical Exam  Exercise Activities and Dietary recommendations Goals    . DIET - REDUCE CALORIE INTAKE     Recommend to cut back on carbohydrates in daily diet (avoid breads).        Immunization History  Administered Date(s) Administered  . Fluad Quad(high Dose 65+) 07/25/2019  . Influenza, High Dose Seasonal PF 07/30/2020  . Influenza-Unspecified 08/15/2018  . PFIZER(Purple Top)SARS-COV-2 Vaccination 12/07/2019, 12/25/2019, 08/17/2020  . Pneumococcal Conjugate-13 01/08/2015  . Pneumococcal Polysaccharide-23 01/15/2016  . Td 10/20/2017  . Tdap 07/01/2007  . Zoster 01/04/2009  . Zoster Recombinat (Shingrix) 12/27/2017, 02/28/2018    Health Maintenance  Topic Date Due  . COVID-19 Vaccine (4 - Booster for Pfizer  series) 02/15/2021  . MAMMOGRAM  12/17/2021  . DEXA SCAN  12/01/2022  . COLONOSCOPY (Pts 45-77yrs Insurance coverage will need to be confirmed)  01/04/2023  . TETANUS/TDAP  10/21/2027  . INFLUENZA VACCINE  Completed  . Hepatitis C Screening  Completed  . PNA vac Low Risk Adult  Completed  . HPV VACCINES  Aged Out    Discussed health benefits of physical activity, and encouraged her to engage in regular exercise appropriate for her age and condition.  Problem List Items Addressed This Visit      Endocrine   Adult hypothyroidism    Followed by ENT        Other   Blood glucose elevated    Not diabetic On metformin for prediabetes Recheck A1c UTD on vaccines      Relevant Orders   Hemoglobin A1c   Hypertriglyceridemia    Previously well controlled  Continue statin Repeat FLP and CMP      Relevant Medications   rosuvastatin (CRESTOR) 5 MG tablet   Other Relevant Orders   Lipid panel   Comprehensive metabolic panel   Avitaminosis D   Relevant Orders   VITAMIN D 25 Hydroxy (Vit-D Deficiency, Fractures)    Other Visit Diagnoses    Encounter for annual physical exam    -  Primary   Relevant Orders   Hemoglobin A1c   Lipid panel   Comprehensive metabolic panel   VITAMIN D 25 Hydroxy (Vit-D Deficiency, Fractures)       Return in about 1 year (around 01/20/2022) for CPE, AWV.     I, Shirlee Latch, MD, have reviewed all documentation for this visit. The documentation on 01/20/21 for the exam, diagnosis, procedures, and orders are all accurate and complete.   Bacigalupo, Marzella Schlein, MD, MPH [facility]

## 2021-01-20 NOTE — Assessment & Plan Note (Signed)
Not diabetic On metformin for prediabetes Recheck A1c UTD on vaccines

## 2021-01-20 NOTE — Assessment & Plan Note (Addendum)
Followed by ENT 

## 2021-01-20 NOTE — Assessment & Plan Note (Signed)
Previously well controlled Continue statin Repeat FLP and CMP  

## 2021-01-20 NOTE — Patient Instructions (Addendum)
Diet Recommendations for Diabetes   Starchy (carb) foods include: Bread, rice, pasta, potatoes, corn, crackers, bagels, muffins, all baked goods.  (Fruits, milk, and yogurt also have carbohydrate, but most of these foods will not spike your blood sugar as the starchy foods will.)  A few fruits do cause high blood sugars; use small portions of bananas (limit to 1/2 at a time), grapes, watermelon, and most tropical fruits.    Protein foods include: Meat, fish, poultry, eggs, dairy foods, and beans such as pinto and kidney beans (beans also provide carbohydrate).   1. Eat at least 3 meals and 1-2 snacks per day. Never go more than 4-5 hours while awake without eating. Eat breakfast within the first hour of getting up.   2. Limit starchy foods to TWO per meal and ONE per snack. ONE portion of a starchy  food is equal to the following:   - ONE slice of bread (or its equivalent, such as half of a hamburger bun).   - 1/2 cup of a "scoopable" starchy food such as potatoes or rice.   - 15 grams of carbohydrate as shown on food label.  3. Include at every meal: a protein food, a carb food, and vegetables and/or fruit.   - Obtain twice as many veg's as protein or carbohydrate foods for both lunch and dinner.   - Fresh or frozen veg's are best.   - Try to keep frozen veg's on hand for a quick vegetable serving.         Preventive Care 21 Years and Older, Female Preventive care refers to lifestyle choices and visits with your health care provider that can promote health and wellness. This includes:  A yearly physical exam. This is also called an annual wellness visit.  Regular dental and eye exams.  Immunizations.  Screening for certain conditions.  Healthy lifestyle choices, such as: ? Eating a healthy diet. ? Getting regular exercise. ? Not using drugs or products that contain nicotine and tobacco. ? Limiting alcohol use. What can I expect for my preventive care visit? Physical exam Your  health care provider will check your:  Height and weight. These may be used to calculate your BMI (body mass index). BMI is a measurement that tells if you are at a healthy weight.  Heart rate and blood pressure.  Body temperature.  Skin for abnormal spots. Counseling Your health care provider may ask you questions about your:  Past medical problems.  Family's medical history.  Alcohol, tobacco, and drug use.  Emotional well-being.  Home life and relationship well-being.  Sexual activity.  Diet, exercise, and sleep habits.  History of falls.  Memory and ability to understand (cognition).  Work and work Statistician.  Pregnancy and menstrual history.  Access to firearms. What immunizations do I need? Vaccines are usually given at various ages, according to a schedule. Your health care provider will recommend vaccines for you based on your age, medical history, and lifestyle or other factors, such as travel or where you work.   What tests do I need? Blood tests  Lipid and cholesterol levels. These may be checked every 5 years, or more often depending on your overall health.  Hepatitis C test.  Hepatitis B test. Screening  Lung cancer screening. You may have this screening every year starting at age 73 if you have a 30-pack-year history of smoking and currently smoke or have quit within the past 15 years.  Colorectal cancer screening. ? All adults should have  this screening starting at age 73 and continuing until age 41. ? Your health care provider may recommend screening at age 73 if you are at increased risk. ? You will have tests every 1-10 years, depending on your results and the type of screening test.  Diabetes screening. ? This is done by checking your blood sugar (glucose) after you have not eaten for a while (fasting). ? You may have this done every 1-3 years.  Mammogram. ? This may be done every 1-2 years. ? Talk with your health care provider about  how often you should have regular mammograms.  Abdominal aortic aneurysm (AAA) screening. You may need this if you are a current or former smoker.  BRCA-related cancer screening. This may be done if you have a family history of breast, ovarian, tubal, or peritoneal cancers. Other tests  STD (sexually transmitted disease) testing, if you are at risk.  Bone density scan. This is done to screen for osteoporosis. You may have this done starting at age 73. Talk with your health care provider about your test results, treatment options, and if necessary, the need for more tests. Follow these instructions at home: Eating and drinking  Eat a diet that includes fresh fruits and vegetables, whole grains, lean protein, and low-fat dairy products. Limit your intake of foods with high amounts of sugar, saturated fats, and salt.  Take vitamin and mineral supplements as recommended by your health care provider.  Do not drink alcohol if your health care provider tells you not to drink.  If you drink alcohol: ? Limit how much you have to 0-1 drink a day. ? Be aware of how much alcohol is in your drink. In the [place] one drink equals one 12 oz bottle of beer (355 mL), one 5 oz glass of wine (148 mL), or one 1 oz glass of hard liquor (44 mL).   Lifestyle  Take daily care of your teeth and gums. Brush your teeth every morning and night with fluoride toothpaste. Floss one time each day.  Stay active. Exercise for at least 30 minutes 5 or more days each week.  Do not use any products that contain nicotine or tobacco, such as cigarettes, e-cigarettes, and chewing tobacco. If you need help quitting, ask your health care provider.  Do not use drugs.  If you are sexually active, practice safe sex. Use a condom or other form of protection in order to prevent STIs (sexually transmitted infections).  Talk with your health care provider about taking a low-dose aspirin or statin.  Find healthy ways to cope with  stress, such as: ? Meditation, yoga, or listening to music. ? Journaling. ? Talking to a trusted person. ? Spending time with friends and family. Safety  Always wear your seat belt while driving or riding in a vehicle.  Do not drive: ? If you have been drinking alcohol. Do not ride with someone who has been drinking. ? When you are tired or distracted. ? While texting.  Wear a helmet and other protective equipment during sports activities.  If you have firearms in your house, make sure you follow all gun safety procedures. What's next?  Visit your health care provider once a year for an annual wellness visit.  Ask your health care provider how often you should have your eyes and teeth checked.  Stay up to date on all vaccines. This information is not intended to replace advice given to you by your health care provider. Make sure you discuss  any questions you have with your health care provider. Document Revised: 10/23/2020 Document Reviewed: 10/27/2018 Elsevier Patient Education  2021 [employer].

## 2021-01-24 DIAGNOSIS — E559 Vitamin D deficiency, unspecified: Secondary | ICD-10-CM | POA: Diagnosis not present

## 2021-01-24 DIAGNOSIS — E781 Pure hyperglyceridemia: Secondary | ICD-10-CM | POA: Diagnosis not present

## 2021-01-24 DIAGNOSIS — R739 Hyperglycemia, unspecified: Secondary | ICD-10-CM | POA: Diagnosis not present

## 2021-01-24 DIAGNOSIS — Z0000 Encounter for general adult medical examination without abnormal findings: Secondary | ICD-10-CM | POA: Diagnosis not present

## 2021-01-25 LAB — LIPID PANEL
Chol/HDL Ratio: 2.6 ratio (ref 0.0–4.4)
Cholesterol, Total: 132 mg/dL (ref 100–199)
HDL: 50 mg/dL (ref >39)
LDL Chol Calc (NIH): 62 mg/dL (ref 0–99)
Triglycerides: 111 mg/dL (ref 0–149)
VLDL Cholesterol Cal: 20 mg/dL (ref 5–40)

## 2021-01-25 LAB — COMPREHENSIVE METABOLIC PANEL
ALT: 25 IU/L (ref 0–32)
AST: 24 IU/L (ref 0–40)
Albumin/Globulin Ratio: 2 (ref 1.2–2.2)
Albumin: 4.6 g/dL (ref 3.7–4.7)
Alkaline Phosphatase: 69 IU/L (ref 44–121)
BUN/Creatinine Ratio: 18 (ref 12–28)
BUN: 14 mg/dL (ref 8–27)
Bilirubin Total: 0.5 mg/dL (ref 0.0–1.2)
CO2: 21 mmol/L (ref 20–29)
Calcium: 9.5 mg/dL (ref 8.7–10.3)
Chloride: 104 mmol/L (ref 96–106)
Creatinine, Ser: 0.76 mg/dL (ref 0.57–1.00)
Globulin, Total: 2.3 g/dL (ref 1.5–4.5)
Glucose: 115 mg/dL — ABNORMAL HIGH (ref 65–99)
Potassium: 4.2 mmol/L (ref 3.5–5.2)
Sodium: 140 mmol/L (ref 134–144)
Total Protein: 6.9 g/dL (ref 6.0–8.5)
eGFR: 83 mL/min/{1.73_m2} (ref >59)

## 2021-01-25 LAB — VITAMIN D 25 HYDROXY (VIT D DEFICIENCY, FRACTURES): Vit D, 25-Hydroxy: 93.3 ng/mL (ref 30.0–100.0)

## 2021-01-25 LAB — HEMOGLOBIN A1C
Est. average glucose Bld gHb Est-mCnc: 114 mg/dL
Hgb A1c MFr Bld: 5.6 % (ref 4.8–5.6)

## 2021-01-28 ENCOUNTER — Telehealth: Payer: Self-pay

## 2021-01-28 NOTE — Telephone Encounter (Signed)
Copied from CRM [id number]. Topic: General - Call Back - No Documentation >> Jan 28, 2021  3:08 PM Randol Kern wrote: Reason for CRM: Pt called to discuss lab results from recent CPE, please advise  Best contact: [phone]

## 2021-01-30 NOTE — Telephone Encounter (Signed)
Patient reviewed labs on mychart. °

## 2021-03-31 DIAGNOSIS — N958 Other specified menopausal and perimenopausal disorders: Secondary | ICD-10-CM | POA: Diagnosis not present

## 2021-03-31 DIAGNOSIS — E038 Other specified hypothyroidism: Secondary | ICD-10-CM | POA: Diagnosis not present

## 2021-04-03 DIAGNOSIS — E038 Other specified hypothyroidism: Secondary | ICD-10-CM | POA: Diagnosis not present

## 2021-04-03 DIAGNOSIS — Z72820 Sleep deprivation: Secondary | ICD-10-CM | POA: Diagnosis not present

## 2021-04-03 DIAGNOSIS — N958 Other specified menopausal and perimenopausal disorders: Secondary | ICD-10-CM | POA: Diagnosis not present

## 2021-04-03 DIAGNOSIS — F330 Major depressive disorder, recurrent, mild: Secondary | ICD-10-CM | POA: Diagnosis not present

## 2021-07-01 DIAGNOSIS — N958 Other specified menopausal and perimenopausal disorders: Secondary | ICD-10-CM | POA: Diagnosis not present

## 2021-07-01 DIAGNOSIS — E038 Other specified hypothyroidism: Secondary | ICD-10-CM | POA: Diagnosis not present

## 2021-07-03 DIAGNOSIS — N958 Other specified menopausal and perimenopausal disorders: Secondary | ICD-10-CM | POA: Diagnosis not present

## 2021-07-03 DIAGNOSIS — M2550 Pain in unspecified joint: Secondary | ICD-10-CM | POA: Diagnosis not present

## 2021-07-03 DIAGNOSIS — Z6827 Body mass index (BMI) 27.0-27.9, adult: Secondary | ICD-10-CM | POA: Diagnosis not present

## 2021-07-03 DIAGNOSIS — Z72820 Sleep deprivation: Secondary | ICD-10-CM | POA: Diagnosis not present

## 2021-08-06 ENCOUNTER — Ambulatory Visit (INDEPENDENT_AMBULATORY_CARE_PROVIDER_SITE_OTHER): Payer: Medicare Other

## 2021-08-06 ENCOUNTER — Other Ambulatory Visit: Payer: Self-pay

## 2021-08-06 DIAGNOSIS — Z23 Encounter for immunization: Secondary | ICD-10-CM | POA: Diagnosis not present

## 2021-09-02 DIAGNOSIS — L821 Other seborrheic keratosis: Secondary | ICD-10-CM | POA: Diagnosis not present

## 2021-09-02 DIAGNOSIS — D2261 Melanocytic nevi of right upper limb, including shoulder: Secondary | ICD-10-CM | POA: Diagnosis not present

## 2021-09-02 DIAGNOSIS — D2262 Melanocytic nevi of left upper limb, including shoulder: Secondary | ICD-10-CM | POA: Diagnosis not present

## 2021-09-02 DIAGNOSIS — L400 Psoriasis vulgaris: Secondary | ICD-10-CM | POA: Diagnosis not present

## 2021-09-11 DIAGNOSIS — E782 Mixed hyperlipidemia: Secondary | ICD-10-CM | POA: Diagnosis not present

## 2021-09-11 DIAGNOSIS — E039 Hypothyroidism, unspecified: Secondary | ICD-10-CM | POA: Diagnosis not present

## 2021-09-11 DIAGNOSIS — E559 Vitamin D deficiency, unspecified: Secondary | ICD-10-CM | POA: Diagnosis not present

## 2021-09-11 DIAGNOSIS — R7303 Prediabetes: Secondary | ICD-10-CM | POA: Diagnosis not present

## 2021-09-30 DIAGNOSIS — E038 Other specified hypothyroidism: Secondary | ICD-10-CM | POA: Diagnosis not present

## 2021-09-30 DIAGNOSIS — N951 Menopausal and female climacteric states: Secondary | ICD-10-CM | POA: Diagnosis not present

## 2021-10-02 DIAGNOSIS — Z6826 Body mass index (BMI) 26.0-26.9, adult: Secondary | ICD-10-CM | POA: Diagnosis not present

## 2021-10-02 DIAGNOSIS — M2550 Pain in unspecified joint: Secondary | ICD-10-CM | POA: Diagnosis not present

## 2021-10-02 DIAGNOSIS — N951 Menopausal and female climacteric states: Secondary | ICD-10-CM | POA: Diagnosis not present

## 2021-10-02 DIAGNOSIS — Z72820 Sleep deprivation: Secondary | ICD-10-CM | POA: Diagnosis not present

## 2021-10-06 DIAGNOSIS — H524 Presbyopia: Secondary | ICD-10-CM | POA: Diagnosis not present

## 2021-10-08 ENCOUNTER — Other Ambulatory Visit: Payer: Self-pay | Admitting: Family Medicine

## 2021-10-08 NOTE — Telephone Encounter (Signed)
Call to patient- she states her bottle only has one RF and she will run out before her next appointment. Patient advised I will add on e more to her Rx- she reports she is doing well with medication.

## 2021-10-13 ENCOUNTER — Other Ambulatory Visit: Payer: Self-pay | Admitting: Family Medicine

## 2021-10-27 DIAGNOSIS — H11431 Conjunctival hyperemia, right eye: Secondary | ICD-10-CM | POA: Diagnosis not present

## 2021-10-27 DIAGNOSIS — S0501XA Injury of conjunctiva and corneal abrasion without foreign body, right eye, initial encounter: Secondary | ICD-10-CM | POA: Diagnosis not present

## 2021-12-23 DIAGNOSIS — Z1231 Encounter for screening mammogram for malignant neoplasm of breast: Secondary | ICD-10-CM | POA: Diagnosis not present

## 2021-12-23 LAB — HM MAMMOGRAPHY

## 2021-12-26 ENCOUNTER — Encounter: Payer: Self-pay | Admitting: Family Medicine

## 2021-12-30 DIAGNOSIS — N951 Menopausal and female climacteric states: Secondary | ICD-10-CM | POA: Diagnosis not present

## 2021-12-30 DIAGNOSIS — E038 Other specified hypothyroidism: Secondary | ICD-10-CM | POA: Diagnosis not present

## 2022-01-01 DIAGNOSIS — N958 Other specified menopausal and perimenopausal disorders: Secondary | ICD-10-CM | POA: Diagnosis not present

## 2022-01-01 DIAGNOSIS — Z6827 Body mass index (BMI) 27.0-27.9, adult: Secondary | ICD-10-CM | POA: Diagnosis not present

## 2022-01-01 DIAGNOSIS — R4586 Emotional lability: Secondary | ICD-10-CM | POA: Diagnosis not present

## 2022-01-15 ENCOUNTER — Ambulatory Visit (INDEPENDENT_AMBULATORY_CARE_PROVIDER_SITE_OTHER): Payer: Medicare Other

## 2022-01-15 DIAGNOSIS — Z0000 Encounter for general adult medical examination without abnormal findings: Secondary | ICD-10-CM | POA: Diagnosis not present

## 2022-01-15 NOTE — Progress Notes (Signed)
Virtual Visit via Telephone Note  I connected with  Jodi Maroonommie J Hauck on 01/15/22 at  1:00 PM EST by telephone and verified that I am speaking with the correct person using two identifiers.  Location: Patient: home Provider: [facility] Persons participating in the virtual visit: patient/Nurse Health Advisor   I discussed the limitations, risks, security and privacy concerns of performing an evaluation and management service by telephone and the availability of in person appointments. The patient expressed understanding and agreed to proceed.  Interactive audio and video telecommunications were attempted between this nurse and patient, however failed, due to patient having technical difficulties OR patient did not have access to video capability.  We continued and completed visit with audio only.  Some vital signs may be absent or patient reported.   Hal HopeLorrie S Melonee Gerstel, LPN  Subjective:   Jodi Day is a 76 y.o. female who presents for Medicare Annual (Subsequent) preventive examination.  Review of Systems           Objective:    There were no vitals filed for this visit. There is no height or weight on file to calculate BMI.  Advanced Directives 01/09/2021 01/08/2020 11/02/2018 10/19/2018 09/23/2018 09/22/2017 04/16/2016  Does Patient Have a Medical Advance Directive? No No No No No No No  Does patient want to make changes to medical advance directive? - - - - - No - Patient declined -  Would patient like information on creating a medical advance directive? No - Patient declined No - Patient declined No - Patient declined No - Patient declined No - Patient declined - -    Current Medications (verified) Outpatient Encounter Medications as of 01/15/2022  Medication Sig   albuterol (PROVENTIL HFA;VENTOLIN HFA) 108 (90 Base) MCG/ACT inhaler Inhale 2 puffs into the lungs every 6 (six) hours as needed for wheezing or shortness of breath. (Patient not taking: No sig reported)   b complex vitamins  tablet Take 1 tablet by mouth daily.   clindamycin (CLEOCIN) 300 MG capsule Take 600 mg by mouth as directed. 1 hour prior to dental procedures (Patient not taking: Reported on 01/20/2021)   co-enzyme Q-10 30 MG capsule Take 100 mg by mouth daily.   DHEA 10 MG TABS Take 10 mg by mouth daily.   liothyronine (CYTOMEL) 5 MCG tablet Take 5 mcg by mouth daily.   metFORMIN (GLUCOPHAGE-XR) 500 MG 24 hr tablet TAKE 1 TABLET BY MOUTH DAILY BEFORE BREAKFAST   Multiple Vitamins-Minerals (ZINC PO) Take by mouth daily.   NON FORMULARY Take 1 capsule by mouth daily at 6 (six) AM. "Eye Sight"   OVER THE COUNTER MEDICATION Take 12.5 mg by mouth daily. Iodine Complex   PHOSPHATIDYLSERINE PO Take 300 mg by mouth daily.   PRESCRIPTION MEDICATION Place 1 application rectally every 4 (four) months. Hormone replacement therapy pellet inserted rectally every 4 months. Dosing depends on blood work   progesterone (PROMETRIUM) 200 MG capsule Take 200 mg by mouth daily.    rosuvastatin (CRESTOR) 5 MG tablet TAKE 1 TABLET BY MOUTH ONCE DAILY   SYNTHROID 125 MCG tablet Take 125 mcg by mouth daily before breakfast.    TURMERIC PO Take 750 mg by mouth daily.    VITAMIN D, CHOLECALCIFEROL, PO Take 10,000 Units by mouth daily.    VITAMIN K PO Take 30.5 mg by mouth daily.    No facility-administered encounter medications on file as of 01/15/2022.    Allergies (verified) Penicillins   History: Past Medical History:  Diagnosis  Date   Anxiety    Diabetes mellitus without complication (HCC)    Goiter, non-toxic 07/04/2007   Hyperlipidemia    Hypothyroidism    Thyroid disease    Past Surgical History:  Procedure Laterality Date   ABDOMINAL HYSTERECTOMY  1992   fibroids. ovaries intact   CHOLECYSTECTOMY  1996   EYE SURGERY Bilateral 2014   cataract surgery   KNEE ARTHROPLASTY Right 11/02/2018   Procedure: COMPUTER ASSISTED TOTAL KNEE ARTHROPLASTY;  Surgeon: Donato Heinz, MD;  Location: [facility] ORS;  Service:  Orthopedics;  Laterality: Right;   thyroidectomy  1973   tonsillectomy and adenoidectomy  1973   Family History  Problem Relation Age of Onset   Diabetes Mother    Hypothyroidism Mother    Stroke Mother    COPD Father    Prostate cancer Brother    Pneumonia Brother        spesis from pneumonia   Prostate cancer Brother    Colon polyps Brother    Parkinson's disease Brother    Cancer Brother        lung cancer   Social History   Socioeconomic History   Marital status: Married    Spouse name: Thereasa Distance   Number of children: 2   Years of education: Associates   Highest education level: Associate degree: occupational, Scientist, product/process development, or vocational program  Occupational History   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    Comment: retired  Tobacco Use   Smoking status: Former    Packs/day: 1.00    Years: 7.00    Pack years: 7.00    Types: Cigarettes    Quit date: 11/15/1977    Years since quitting: 44.1   Smokeless tobacco: Never  Vaping Use   Vaping Use: Never used  Substance and Sexual Activity   Alcohol use: Yes    Alcohol/week: 1.0 standard drink    Types: 1 Glasses of wine per week   Drug use: No   Sexual activity: Yes  Other Topics Concern   Not on file  Social History Narrative   Not on file   Social Determinants of Health   Financial Resource Strain: Not on file  Food Insecurity: Not on file  Transportation Needs: Not on file  Physical Activity: Not on file  Stress: Not on file  Social Connections: Not on file    Tobacco Counseling Counseling given: Not Answered   Clinical Intake:  Pre-visit preparation completed: Yes  Pain : No/denies pain     Nutritional Risks: None Diabetes: No  How often do you need to have someone help you when you read instructions, pamphlets, or other written materials from your doctor or pharmacy?: 1 - Never  Diabetic?no  Interpreter Needed?: No  Information entered by :: Kennedy Bucker,  LPN   Activities of Daily Living In your present state of health, do you have any difficulty performing the following activities: 01/14/2022  Hearing? N  Vision? N  Difficulty concentrating or making decisions? N  Walking or climbing stairs? N  Dressing or bathing? N  Doing errands, shopping? N  Preparing Food and eating ? N  Using the Toilet? N  In the past six months, have you accidently leaked urine? Y  Do you have problems with loss of bowel control? N  Managing your Medications? N  Managing your Finances? N  Housekeeping or managing your Housekeeping? N  Some recent data might be hidden    Patient Care Team: Erasmo Downer, MD  as PCP - General (Family Medicine) Vernie Murders, MD (Otolaryngology) System, Provider Not In (Ophthalmology) System, Provider Not In (Dermatology)  Indicate any recent Medical Services you may have received from other than [facility] providers in the past year (date may be approximate).     Assessment:   This is a routine wellness examination for Jodi Day.  Hearing/Vision screen No results found.  Dietary issues and exercise activities discussed:     Goals Addressed   None    Depression Screen PHQ 2/9 Scores 01/20/2021 01/09/2021 01/08/2020 01/05/2019 09/23/2018 09/22/2017 09/22/2017  PHQ - 2 Score 0 0 0 0 0 0 0  PHQ- 9 Score 0 - - 1 - 2 -    Fall Risk Fall Risk  01/14/2022 01/09/2021 01/08/2020 01/05/2019 09/23/2018  Falls in the past year? 0 0 0 0 0  Number falls in past yr: - 0 0 - -  Injury with Fall? - 0 0 - -    FALL RISK PREVENTION PERTAINING TO THE HOME:  Any stairs in or around the home? Yes  If so, are there any without handrails? No  Home free of loose throw rugs in walkways, pet beds, electrical cords, etc? Yes  Adequate lighting in your home to reduce risk of falls? Yes   ASSISTIVE DEVICES UTILIZED TO PREVENT FALLS:  Life alert? No  Use of a cane, walker or w/c? No  Grab bars in the bathroom? Yes  Shower chair or bench in  shower? Yes  Elevated toilet seat or a handicapped toilet? Yes   Cognitive Function:     6CIT Screen 01/20/2021 01/08/2020 09/22/2017  What Year? 0 points 0 points 0 points  What month? 0 points 0 points 0 points  What time? 0 points 0 points 0 points  Count back from 20 0 points 0 points 0 points  Months in reverse 0 points 2 points 2 points  Repeat phrase 0 points 0 points 0 points  Total Score 0 2 2    Immunizations Immunization History  Administered Date(s) Administered   Fluad Quad(high Dose 65+) 07/25/2019, 08/06/2021   Influenza, High Dose Seasonal PF 07/30/2020   Influenza-Unspecified 08/15/2018   PFIZER(Purple Top)SARS-COV-2 Vaccination 12/07/2019, 12/25/2019, 08/17/2020   Pneumococcal Conjugate-13 01/08/2015   Pneumococcal Polysaccharide-23 01/15/2016   Td 10/20/2017   Tdap 07/01/2007   Zoster Recombinat (Shingrix) 12/27/2017, 02/28/2018   Zoster, Live 01/04/2009    TDAP status: Up to date  Flu Vaccine status: Up to date  Pneumococcal vaccine status: Up to date  Covid-19 vaccine status: Completed vaccines  Qualifies for Shingles Vaccine? Yes   Zostavax completed Yes   Shingrix Completed?: Yes  Screening Tests Health Maintenance  Topic Date Due   COVID-19 Vaccine (4 - Booster for Pfizer series) 10/12/2020   DEXA SCAN  12/01/2022   MAMMOGRAM  12/23/2022   COLONOSCOPY (Pts 45-12yrs Insurance coverage will need to be confirmed)  01/04/2023   TETANUS/TDAP  10/21/2027   Pneumonia Vaccine 75+ Years old  Completed   INFLUENZA VACCINE  Completed   Hepatitis C Screening  Completed   Zoster Vaccines- Shingrix  Completed   HPV VACCINES  Aged Out    Health Maintenance  Health Maintenance Due  Topic Date Due   COVID-19 Vaccine (4 - Booster for Pfizer series) 10/12/2020    Colorectal cancer screening: Type of screening: Colonoscopy. Completed 01/04/18. Repeat every 5 years  Mammogram status: Completed 12/23/21. Repeat every year  Bone Density status: Completed  12/01/17. Results reflect: Bone density results: NORMAL. Repeat every  5 years.  Lung Cancer Screening: (Low Dose CT Chest recommended if Age 9-80 years, 30 pack-year currently smoking OR have quit w/in 15years.) does not qualify.   Additional Screening:  Hepatitis C Screening: does qualify; Completed 10/22/11  Vision Screening: Recommended annual ophthalmology exams for early detection of glaucoma and other disorders of the eye. Is the patient up to date with their annual eye exam?  Yes  Who is the provider or what is the name of the office in which the patient attends annual eye exams? [facility] If pt is not established with a provider, would they like to be referred to a provider to establish care? No .   Dental Screening: Recommended annual dental exams for proper oral hygiene  Community Resource Referral / Chronic Care Management: CRR required this visit?  No   CCM required this visit?  No      Plan:     I have personally reviewed and noted the following in the patients chart:   Medical and social history Use of alcohol, tobacco or illicit drugs  Current medications and supplements including opioid prescriptions.  Functional ability and status Nutritional status Physical activity Advanced directives List of other physicians Hospitalizations, surgeries, and ER visits in previous 12 months Vitals Screenings to include cognitive, depression, and falls Referrals and appointments  In addition, I have reviewed and discussed with patient certain preventive protocols, quality metrics, and best practice recommendations. A written personalized care plan for preventive services as well as general preventive health recommendations were provided to patient.     Hal Hope, LPN   12/25/6379   Nurse Notes: none

## 2022-01-15 NOTE — Patient Instructions (Signed)
Jodi Day , Thank you for taking time to come for your Medicare Wellness Visit. I appreciate your ongoing commitment to your health goals. Please review the following plan we discussed and let me know if I can assist you in the future.   Screening recommendations/referrals: Colonoscopy: 01/04/18 Mammogram: 12/23/21 Bone Density: 12/01/17 Recommended yearly ophthalmology/optometry visit for glaucoma screening and checkup Recommended yearly dental visit for hygiene and checkup  Vaccinations: Influenza vaccine: 08/06/21 Pneumococcal vaccine: 01/15/16 Tdap vaccine: 10/20/17 Shingles vaccine: Shingrix 12/27/17, 02/28/18  Zostavax 01/04/09   Covid-19:12/07/19, 12/25/19, 08/17/20  Advanced directives: no  Conditions/risks identified: none  Next appointment: Follow up in one year for your annual wellness visit 01/19/23 @ 1pm by phone   Preventive Care 65 Years and Older, Female Preventive care refers to lifestyle choices and visits with your health care provider that can promote health and wellness. What does preventive care include? A yearly physical exam. This is also called an annual well check. Dental exams once or twice a year. Routine eye exams. Ask your health care provider how often you should have your eyes checked. Personal lifestyle choices, including: Daily care of your teeth and gums. Regular physical activity. Eating a healthy diet. Avoiding tobacco and drug use. Limiting alcohol use. Practicing safe sex. Taking low-dose aspirin every day. Taking vitamin and mineral supplements as recommended by your health care provider. What happens during an annual well check? The services and screenings done by your health care provider during your annual well check will depend on your age, overall health, lifestyle risk factors, and family history of disease. Counseling  Your health care provider may ask you questions about your: Alcohol use. Tobacco use. Drug use. Emotional well-being. Home  and relationship well-being. Sexual activity. Eating habits. History of falls. Memory and ability to understand (cognition). Work and work Astronomer. Reproductive health. Screening  You may have the following tests or measurements: Height, weight, and BMI. Blood pressure. Lipid and cholesterol levels. These may be checked every 5 years, or more frequently if you are over 41 years old. Skin check. Lung cancer screening. You may have this screening every year starting at age 19 if you have a 30-pack-year history of smoking and currently smoke or have quit within the past 15 years. Fecal occult blood test (FOBT) of the stool. You may have this test every year starting at age 32. Flexible sigmoidoscopy or colonoscopy. You may have a sigmoidoscopy every 5 years or a colonoscopy every 10 years starting at age 54. Hepatitis C blood test. Hepatitis B blood test. Sexually transmitted disease (STD) testing. Diabetes screening. This is done by checking your blood sugar (glucose) after you have not eaten for a while (fasting). You may have this done every 1-3 years. Bone density scan. This is done to screen for osteoporosis. You may have this done starting at age 59. Mammogram. This may be done every 1-2 years. Talk to your health care provider about how often you should have regular mammograms. Talk with your health care provider about your test results, treatment options, and if necessary, the need for more tests. Vaccines  Your health care provider may recommend certain vaccines, such as: Influenza vaccine. This is recommended every year. Tetanus, diphtheria, and acellular pertussis (Tdap, Td) vaccine. You may need a Td booster every 10 years. Zoster vaccine. You may need this after age 29. Pneumococcal 13-valent conjugate (PCV13) vaccine. One dose is recommended after age 65. Pneumococcal polysaccharide (PPSV23) vaccine. One dose is recommended after age 50. Talk  to your health care provider  about which screenings and vaccines you need and how often you need them. This information is not intended to replace advice given to you by your health care provider. Make sure you discuss any questions you have with your health care provider. Document Released: 11/29/2015 Document Revised: 07/22/2016 Document Reviewed: 09/03/2015 Elsevier Interactive Patient Education  2017 [place] Prevention in the Home Falls can cause injuries. They can happen to people of all ages. There are many things you can do to make your home safe and to help prevent falls. What can I do on the outside of my home? Regularly fix the edges of walkways and driveways and fix any cracks. Remove anything that might make you trip as you walk through a door, such as a raised step or threshold. Trim any bushes or trees on the path to your home. Use bright outdoor lighting. Clear any walking paths of anything that might make someone trip, such as rocks or tools. Regularly check to see if handrails are loose or broken. Make sure that both sides of any steps have handrails. Any raised decks and porches should have guardrails on the edges. Have any leaves, snow, or ice cleared regularly. Use sand or salt on walking paths during winter. Clean up any spills in your garage right away. This includes oil or grease spills. What can I do in the bathroom? Use night lights. Install grab bars by the toilet and in the tub and shower. Do not use towel bars as grab bars. Use non-skid mats or decals in the tub or shower. If you need to sit down in the shower, use a plastic, non-slip stool. Keep the floor dry. Clean up any water that spills on the floor as soon as it happens. Remove soap buildup in the tub or shower regularly. Attach bath mats securely with double-sided non-slip rug tape. Do not have throw rugs and other things on the floor that can make you trip. What can I do in the bedroom? Use night lights. Make sure  that you have a light by your bed that is easy to reach. Do not use any sheets or blankets that are too big for your bed. They should not hang down onto the floor. Have a firm chair that has side arms. You can use this for support while you get dressed. Do not have throw rugs and other things on the floor that can make you trip. What can I do in the kitchen? Clean up any spills right away. Avoid walking on wet floors. Keep items that you use a lot in easy-to-reach places. If you need to reach something above you, use a strong step stool that has a grab bar. Keep electrical cords out of the way. Do not use floor polish or wax that makes floors slippery. If you must use wax, use non-skid floor wax. Do not have throw rugs and other things on the floor that can make you trip. What can I do with my stairs? Do not leave any items on the stairs. Make sure that there are handrails on both sides of the stairs and use them. Fix handrails that are broken or loose. Make sure that handrails are as long as the stairways. Check any carpeting to make sure that it is firmly attached to the stairs. Fix any carpet that is loose or worn. Avoid having throw rugs at the top or bottom of the stairs. If you do have throw rugs,  attach them to the floor with carpet tape. Make sure that you have a light switch at the top of the stairs and the bottom of the stairs. If you do not have them, ask someone to add them for you. What else can I do to help prevent falls? Wear shoes that: Do not have high heels. Have rubber bottoms. Are comfortable and fit you well. Are closed at the toe. Do not wear sandals. If you use a stepladder: Make sure that it is fully opened. Do not climb a closed stepladder. Make sure that both sides of the stepladder are locked into place. Ask someone to hold it for you, if possible. Clearly mark and make sure that you can see: Any grab bars or handrails. First and last steps. Where the edge of  each step is. Use tools that help you move around (mobility aids) if they are needed. These include: Canes. Walkers. Scooters. Crutches. Turn on the lights when you go into a dark area. Replace any light bulbs as soon as they burn out. Set up your furniture so you have a clear path. Avoid moving your furniture around. If any of your floors are uneven, fix them. If there are any pets around you, be aware of where they are. Review your medicines with your doctor. Some medicines can make you feel dizzy. This can increase your chance of falling. Ask your doctor what other things that you can do to help prevent falls. This information is not intended to replace advice given to you by your health care provider. Make sure you discuss any questions you have with your health care provider. Document Released: 08/29/2009 Document Revised: 04/09/2016 Document Reviewed: 12/07/2014 Elsevier Interactive Patient Education  2017 [employer].

## 2022-01-22 ENCOUNTER — Ambulatory Visit (INDEPENDENT_AMBULATORY_CARE_PROVIDER_SITE_OTHER): Payer: Medicare Other | Admitting: Family Medicine

## 2022-01-22 ENCOUNTER — Other Ambulatory Visit: Payer: Self-pay

## 2022-01-22 ENCOUNTER — Encounter: Payer: Self-pay | Admitting: Family Medicine

## 2022-01-22 VITALS — BP 110/77 | Temp 97.6°F | Resp 16 | Ht 69.0 in | Wt 183.8 lb

## 2022-01-22 DIAGNOSIS — R739 Hyperglycemia, unspecified: Secondary | ICD-10-CM | POA: Diagnosis not present

## 2022-01-22 DIAGNOSIS — E781 Pure hyperglyceridemia: Secondary | ICD-10-CM

## 2022-01-22 DIAGNOSIS — Z0000 Encounter for general adult medical examination without abnormal findings: Secondary | ICD-10-CM | POA: Diagnosis not present

## 2022-01-22 DIAGNOSIS — E559 Vitamin D deficiency, unspecified: Secondary | ICD-10-CM | POA: Diagnosis not present

## 2022-01-22 DIAGNOSIS — E039 Hypothyroidism, unspecified: Secondary | ICD-10-CM

## 2022-01-22 MED ORDER — METFORMIN HCL ER 500 MG PO TB24: ORAL_TABLET | ORAL | 3 refills | Status: DC

## 2022-01-22 MED ORDER — ROSUVASTATIN CALCIUM 5 MG PO TABS: 5.0000 mg | ORAL_TABLET | Freq: Every day | ORAL | 3 refills | Status: DC

## 2022-01-22 NOTE — Addendum Note (Signed)
Addended by: Hyacinth Meeker on: 01/22/2022 04:18 PM ? ? Modules accepted: Orders ? ?

## 2022-01-22 NOTE — Assessment & Plan Note (Signed)
F/b Integrative medicine now ?Recheck TSH ?No change to synthroid pending lab results ?

## 2022-01-22 NOTE — Assessment & Plan Note (Addendum)
Reviewed last lipid panel ?Did start Crestor and CoQ10 - tolerating well ?Recheck FLP and CMP ?Discussed diet and exercise  ?

## 2022-01-22 NOTE — Progress Notes (Signed)
I,Sulibeya S Dimas,acting as a Education administrator for Lavon Paganini, MD.,have documented all relevant documentation on the behalf of Lavon Paganini, MD,as directed by  Lavon Paganini, MD while in the presence of Lavon Paganini, MD.   Complete physical exam   Patient: Jodi Day   DOB: 1946-02-06   76 y.o. Female  MRN: [id number] Visit Date: 01/22/2022  Today's healthcare provider: Lavon Paganini, MD   Chief Complaint  Patient presents with   Annual Exam   Subjective    Jodi Day is a 76 y.o. female who presents today for a complete physical exam.  She reports consuming a  no wheat, no sugar, or corn and no dairy  diet. Gym/ health club routine includes walking on track  and swim. She generally feels well. She reports sleeping well. She does have additional problems to discuss today.  HPI    Patient reports she was recently treated for yeast with diflucan for 14 days. Patient wants to know how long she can be on diflucan. Dr. Sharol Roussel at [facility]. She was not tested for yeast, but told that she probably had this after a discussion. She also went on no sugar, low carb diet. Didn't know that she was sick but felt like she came out of a brain fog after taking the medication.   Past Medical History:  Diagnosis Date   Anxiety    Diabetes mellitus without complication ([place])    Goiter, non-toxic 07/04/2007   Hyperlipidemia    Hypothyroidism    Thyroid disease    Past Surgical History:  Procedure Laterality Date   ABDOMINAL HYSTERECTOMY  1992   fibroids. ovaries intact   CHOLECYSTECTOMY  1996   EYE SURGERY Bilateral 2014   cataract surgery   KNEE ARTHROPLASTY Right 11/02/2018   Procedure: COMPUTER ASSISTED TOTAL KNEE ARTHROPLASTY;  Surgeon: Dereck Leep, MD;  Location: [facility] ORS;  Service: Orthopedics;  Laterality: Right;   thyroidectomy  1973   tonsillectomy and adenoidectomy  1973   Social History   Socioeconomic History   Marital status: Married     Spouse name: [name]   Number of children: 2   Years of education: Associates   Highest education level: Associate degree: occupational, Hotel manager, or vocational program  Occupational History   Occupation: Self Employeed owns a Scientist, research (life sciences) estate business    Comment: retired  Tobacco Use   Smoking status: Former    Packs/day: 1.00    Years: 7.00    Pack years: 7.00    Types: Cigarettes    Quit date: 11/15/1977    Years since quitting: 44.2   Smokeless tobacco: Never  Vaping Use   Vaping Use: Never used  Substance and Sexual Activity   Alcohol use: Yes    Alcohol/week: 1.0 standard drink    Types: 1 Glasses of wine per week   Drug use: No   Sexual activity: Yes  Other Topics Concern   Not on file  Social History Narrative   Not on file   Social Determinants of Health   Financial Resource Strain: Low Risk    Difficulty of Paying Living Expenses: Not hard at all  Food Insecurity: No Food Insecurity   Worried About Charity fundraiser in the Last Year: Never true   [place] in the Last Year: Never true  Transportation Needs: No Transportation Needs   Lack of Transportation (Medical): No   Lack of Transportation (Non-Medical): No  Physical Activity: Sufficiently Active  Days of Exercise per Week: 4 days   Minutes of Exercise per Session: 60 min  Stress: No Stress Concern Present   Feeling of Stress : Not at all  Social Connections: Moderately Isolated   Frequency of Communication with Friends and Family: More than three times a week   Frequency of Social Gatherings with Friends and Family: Once a week   Attends Religious Services: Never   Marine scientist or Organizations: No   Attends Music therapist: Never   Marital Status: Married  Human resources officer Violence: Not At Risk   Fear of Current or Ex-Partner: No   Emotionally Abused: No   Physically Abused: No   Sexually Abused: No   Family Status  Relation Name Status   Mother  Deceased at  age 50   Father  Deceased at age 26       COPD, tobacco abuse   Brother  Deceased at age 75       pneumonia   Brother  Alive   Brother  Alive   Family History  Problem Relation Age of Onset   Diabetes Mother    Hypothyroidism Mother    Stroke Mother    COPD Father    Prostate cancer Brother    Pneumonia Brother        spesis from pneumonia   Prostate cancer Brother    Colon polyps Brother    Parkinson's disease Brother    Cancer Brother        lung cancer   Allergies  Allergen Reactions   Penicillins Other (See Comments)    Unknown, per pt's mother. Has patient had a PCN reaction causing immediate rash, facial/tongue/throat swelling, SOB or lightheadedness with hypotension: Unknown Has patient had a PCN reaction causing severe rash involving mucus membranes or skin necrosis: Unknown Has patient had a PCN reaction that required hospitalization: No Has patient had a PCN reaction occurring within the last 10 years: No If all of the above answers are "NO", then may proceed with Cephalosporin use.     Patient Care Team: Virginia Crews, MD as PCP - General (Family Medicine) Margaretha Sheffield, MD (Otolaryngology) System, Provider Not In (Ophthalmology) System, Provider Not In (Dermatology)   Medications: Outpatient Medications Prior to Visit  Medication Sig   b complex vitamins tablet Take 1 tablet by mouth daily.   co-enzyme Q-10 30 MG capsule Take 100 mg by mouth daily.   DHEA 10 MG TABS Take 10 mg by mouth daily.   fluconazole (DIFLUCAN) 200 MG tablet Take 200 mg by mouth daily.   liothyronine (CYTOMEL) 5 MCG tablet Take 5 mcg by mouth daily.   Multiple Vitamins-Minerals (ZINC PO) Take by mouth daily.   NON FORMULARY Take 1 capsule by mouth daily at 6 (six) AM. "Eye Sight"   OVER THE COUNTER MEDICATION Take 12.5 mg by mouth daily. Iodine Complex   PHOSPHATIDYLSERINE PO Take 300 mg by mouth daily.   PRESCRIPTION MEDICATION Place 1 application rectally every 4 (four)  months. Hormone replacement therapy pellet inserted rectally every 4 months. Dosing depends on blood work   progesterone (PROMETRIUM) 200 MG capsule Take 200 mg by mouth daily.    SYNTHROID 125 MCG tablet Take 125 mcg by mouth daily before breakfast.    TURMERIC PO Take 750 mg by mouth daily.    VITAMIN D, CHOLECALCIFEROL, PO Take 10,000 Units by mouth daily.    VITAMIN K PO Take 30.5 mg by mouth daily.    [  DISCONTINUED] metFORMIN (GLUCOPHAGE-XR) 500 MG 24 hr tablet TAKE 1 TABLET BY MOUTH DAILY BEFORE BREAKFAST   [DISCONTINUED] rosuvastatin (CRESTOR) 5 MG tablet TAKE 1 TABLET BY MOUTH ONCE DAILY   [DISCONTINUED] albuterol (PROVENTIL HFA;VENTOLIN HFA) 108 (90 Base) MCG/ACT inhaler Inhale 2 puffs into the lungs every 6 (six) hours as needed for wheezing or shortness of breath.   [DISCONTINUED] clindamycin (CLEOCIN) 300 MG capsule Take 600 mg by mouth as directed. 1 hour prior to dental procedures (Patient not taking: Reported on 01/20/2021)   No facility-administered medications prior to visit.    Review of Systems  HENT:  Positive for hearing loss and tinnitus.   Eyes:  Positive for redness.  Gastrointestinal:  Positive for abdominal distention.  Musculoskeletal:  Positive for arthralgias.  Psychiatric/Behavioral:  The patient is nervous/anxious.   All other systems reviewed and are negative.  Last CBC Lab Results  Component Value Date   WBC 7.6 01/05/2019   HGB 13.8 01/05/2019   HCT 41.1 01/05/2019   MCV 88 01/05/2019   MCH 29.6 01/05/2019   RDW 12.3 01/05/2019   PLT 338 57/84/6962   Last metabolic panel Lab Results  Component Value Date   GLUCOSE 115 (H) 01/24/2021   NA 140 01/24/2021   K 4.2 01/24/2021   CL 104 01/24/2021   CO2 21 01/24/2021   BUN 14 01/24/2021   CREATININE 0.76 01/24/2021   EGFR 83 01/24/2021   CALCIUM 9.5 01/24/2021   PROT 6.9 01/24/2021   ALBUMIN 4.6 01/24/2021   LABGLOB 2.3 01/24/2021   AGRATIO 2.0 01/24/2021   BILITOT 0.5 01/24/2021   ALKPHOS  69 01/24/2021   AST 24 01/24/2021   ALT 25 01/24/2021   ANIONGAP 8 10/19/2018   Last lipids Lab Results  Component Value Date   CHOL 132 01/24/2021   HDL 50 01/24/2021   LDLCALC 62 01/24/2021   TRIG 111 01/24/2021   CHOLHDL 2.6 01/24/2021   Last hemoglobin A1c Lab Results  Component Value Date   HGBA1C 5.6 01/24/2021   Last thyroid functions Lab Results  Component Value Date   TSH 0.247 (L) 01/19/2020   Last vitamin D Lab Results  Component Value Date   VD25OH 93.3 01/24/2021   Last vitamin B12 and Folate Lab Results  Component Value Date   VITAMINB12 486 01/05/2019      Objective    BP 110/77 (BP Location: Left Arm, Patient Position: Sitting, Cuff Size: Large)    Temp 97.6 F (36.4 C) (Temporal)    Resp 16    Ht 5' 9" (1.753 m)    Wt 183 lb 12.8 oz (83.4 kg)    BMI 27.14 kg/m  BP Readings from Last 3 Encounters:  01/22/22 110/77  01/20/21 132/68  01/18/20 126/73   Wt Readings from Last 3 Encounters:  01/22/22 183 lb 12.8 oz (83.4 kg)  01/20/21 188 lb (85.3 kg)  01/18/20 193 lb (87.5 kg)       Physical Exam Vitals reviewed.  Constitutional:      General: She is not in acute distress.    Appearance: Normal appearance. She is well-developed. She is not diaphoretic.  HENT:     Head: Normocephalic and atraumatic.     Right Ear: Tympanic membrane, ear canal and external ear normal.     Left Ear: Tympanic membrane, ear canal and external ear normal.     Nose: Nose normal.     Mouth/Throat:     Mouth: Mucous membranes are moist.     Pharynx:  Oropharynx is clear. No oropharyngeal exudate.  Eyes:     General: No scleral icterus.    Conjunctiva/sclera: Conjunctivae normal.     Pupils: Pupils are equal, round, and reactive to light.  Neck:     Thyroid: No thyromegaly.  Cardiovascular:     Rate and Rhythm: Normal rate and regular rhythm.     Pulses: Normal pulses.     Heart sounds: Normal heart sounds. No murmur heard. Pulmonary:     Effort: Pulmonary  effort is normal. No respiratory distress.     Breath sounds: Normal breath sounds. No wheezing or rales.  Abdominal:     General: There is no distension.     Palpations: Abdomen is soft.     Tenderness: There is no abdominal tenderness.  Musculoskeletal:     Cervical back: Neck supple.     Right lower leg: No edema.     Left lower leg: No edema.  Lymphadenopathy:     Cervical: No cervical adenopathy.  Skin:    General: Skin is warm and dry.     Findings: No rash.  Neurological:     Mental Status: She is alert and oriented to person, place, and time. Mental status is at baseline.     Sensory: No sensory deficit.     Motor: No weakness.     Gait: Gait normal.  Psychiatric:        Mood and Affect: Mood normal.        Behavior: Behavior normal.        Thought Content: Thought content normal.      Last depression screening scores PHQ 2/9 Scores 01/22/2022 01/15/2022 01/20/2021  PHQ - 2 Score 0 0 0  PHQ- 9 Score 1 - 0   Last fall risk screening Fall Risk  01/22/2022  Falls in the past year? 0  Number falls in past yr: 0  Injury with Fall? 0  Risk for fall due to : No Fall Risks  Follow up Falls evaluation completed   Last Audit-C alcohol use screening Alcohol Use Disorder Test (AUDIT) 01/22/2022  1. How often do you have a drink containing alcohol? 2  2. How many drinks containing alcohol do you have on a typical day when you are drinking? 0  3. How often do you have six or more drinks on one occasion? 0  AUDIT-C Score 2  Alcohol Brief Interventions/Follow-up -   A score of 3 or more in women, and 4 or more in men indicates increased risk for alcohol abuse, EXCEPT if all of the points are from question 1   No results found for any visits on 01/22/22.  Assessment & Plan    Routine Health Maintenance and Physical Exam  Exercise Activities and Dietary recommendations  Goals      DIET - EAT MORE FRUITS AND VEGETABLES     DIET - REDUCE CALORIE INTAKE     Recommend to cut back  on carbohydrates in daily diet (avoid breads).         Immunization History  Administered Date(s) Administered   Fluad Quad(high Dose 65+) 07/25/2019, 08/06/2021   Influenza, High Dose Seasonal PF 07/30/2020   Influenza-Unspecified 08/15/2018   PFIZER(Purple Top)SARS-COV-2 Vaccination 12/07/2019, 12/25/2019, 08/17/2020   Pneumococcal Conjugate-13 01/08/2015   Pneumococcal Polysaccharide-23 01/15/2016   Td 10/20/2017   Tdap 07/01/2007   Zoster Recombinat (Shingrix) 12/27/2017, 02/28/2018   Zoster, Live 01/04/2009    Health Maintenance  Topic Date Due   COVID-19 Vaccine (4 -  Booster for [employer] series) 10/12/2020   DEXA SCAN  12/01/2022   MAMMOGRAM  12/23/2022   COLONOSCOPY (Pts 45-19yr Insurance coverage will need to be confirmed)  01/04/2023   TETANUS/TDAP  10/21/2027   Pneumonia Vaccine 76 Years old  Completed   INFLUENZA VACCINE  Completed   Hepatitis C Screening  Completed   Zoster Vaccines- Shingrix  Completed   HPV VACCINES  Aged Out    Discussed health benefits of physical activity, and encouraged her to engage in regular exercise appropriate for her age and condition.  Problem List Items Addressed This Visit       Endocrine   Adult hypothyroidism    F/b Integrative medicine now Recheck TSH No change to synthroid pending lab results      Relevant Orders   TSH     Other   Blood glucose elevated    Recommend low carb diet Recheck A1c      Relevant Orders   Hemoglobin A1c   Hypertriglyceridemia    Reviewed last lipid panel Did start Crestor and CoQ10 - tolerating well Recheck FLP and CMP Discussed diet and exercise       Relevant Medications   rosuvastatin (CRESTOR) 5 MG tablet   Other Relevant Orders   Lipid Panel With LDL/HDL Ratio   Avitaminosis D    Continue supplement  Recheck level      Relevant Orders   VITAMIN D 25 Hydroxy (Vit-D Deficiency, Fractures)   Other Visit Diagnoses     Encounter for annual physical exam    -  Primary    Relevant Orders   Comprehensive metabolic panel   Lipid Panel With LDL/HDL Ratio   TSH        Return in about 1 year (around 01/23/2023) for CPE.     I, ALavon Paganini MD, have reviewed all documentation for this visit. The documentation on 01/22/22 for the exam, diagnosis, procedures, and orders are all accurate and complete.   Georgine Wiltse, ADionne Bucy MD, MPH [email or web address]

## 2022-01-22 NOTE — Assessment & Plan Note (Signed)
Recommend low carb diet °Recheck A1c  °

## 2022-01-22 NOTE — Assessment & Plan Note (Signed)
Continue supplement Recheck level 

## 2022-02-06 DIAGNOSIS — E781 Pure hyperglyceridemia: Secondary | ICD-10-CM | POA: Diagnosis not present

## 2022-02-06 DIAGNOSIS — R7303 Prediabetes: Secondary | ICD-10-CM | POA: Diagnosis not present

## 2022-02-06 DIAGNOSIS — E039 Hypothyroidism, unspecified: Secondary | ICD-10-CM | POA: Diagnosis not present

## 2022-02-06 DIAGNOSIS — E559 Vitamin D deficiency, unspecified: Secondary | ICD-10-CM | POA: Diagnosis not present

## 2022-02-06 DIAGNOSIS — Z0000 Encounter for general adult medical examination without abnormal findings: Secondary | ICD-10-CM | POA: Diagnosis not present

## 2022-02-06 DIAGNOSIS — R739 Hyperglycemia, unspecified: Secondary | ICD-10-CM | POA: Diagnosis not present

## 2022-02-07 LAB — COMPREHENSIVE METABOLIC PANEL
ALT: 35 IU/L — ABNORMAL HIGH (ref 0–32)
AST: 27 IU/L (ref 0–40)
Albumin/Globulin Ratio: 1.7 (ref 1.2–2.2)
Albumin: 4.5 g/dL (ref 3.7–4.7)
Alkaline Phosphatase: 78 IU/L (ref 44–121)
BUN/Creatinine Ratio: 19 (ref 12–28)
BUN: 13 mg/dL (ref 8–27)
Bilirubin Total: 0.5 mg/dL (ref 0.0–1.2)
CO2: 23 mmol/L (ref 20–29)
Calcium: 9.5 mg/dL (ref 8.7–10.3)
Chloride: 104 mmol/L (ref 96–106)
Creatinine, Ser: 0.7 mg/dL (ref 0.57–1.00)
Globulin, Total: 2.6 g/dL (ref 1.5–4.5)
Glucose: 109 mg/dL — ABNORMAL HIGH (ref 70–99)
Potassium: 4.7 mmol/L (ref 3.5–5.2)
Sodium: 141 mmol/L (ref 134–144)
Total Protein: 7.1 g/dL (ref 6.0–8.5)
eGFR: 91 mL/min/{1.73_m2} (ref >59)

## 2022-02-07 LAB — LIPID PANEL WITH LDL/HDL RATIO
Cholesterol, Total: 107 mg/dL (ref 100–199)
HDL: 44 mg/dL (ref >39)
LDL Chol Calc (NIH): 47 mg/dL (ref 0–99)
LDL/HDL Ratio: 1.1 ratio (ref 0.0–3.2)
Triglycerides: 81 mg/dL (ref 0–149)
VLDL Cholesterol Cal: 16 mg/dL (ref 5–40)

## 2022-02-07 LAB — HEMOGLOBIN A1C
Est. average glucose Bld gHb Est-mCnc: 114 mg/dL
Hgb A1c MFr Bld: 5.6 % (ref 4.8–5.6)

## 2022-02-07 LAB — VITAMIN D 25 HYDROXY (VIT D DEFICIENCY, FRACTURES): Vit D, 25-Hydroxy: 116 ng/mL — ABNORMAL HIGH (ref 30.0–100.0)

## 2022-02-07 LAB — TSH: TSH: 0.131 u[IU]/mL — ABNORMAL LOW (ref 0.450–4.500)

## 2022-03-04 ENCOUNTER — Encounter: Payer: Self-pay | Admitting: Family Medicine

## 2022-03-05 MED ORDER — METFORMIN HCL ER 500 MG PO TB24: 500.0000 mg | ORAL_TABLET | Freq: Two times a day (BID) | ORAL | 1 refills | Status: DC

## 2022-03-05 NOTE — Telephone Encounter (Signed)
Please send new Rx for BID dosing #180 as requested and let patient know.

## 2022-03-31 DIAGNOSIS — N951 Menopausal and female climacteric states: Secondary | ICD-10-CM | POA: Diagnosis not present

## 2022-03-31 DIAGNOSIS — E038 Other specified hypothyroidism: Secondary | ICD-10-CM | POA: Diagnosis not present

## 2022-04-01 DIAGNOSIS — D2262 Melanocytic nevi of left upper limb, including shoulder: Secondary | ICD-10-CM | POA: Diagnosis not present

## 2022-04-01 DIAGNOSIS — L570 Actinic keratosis: Secondary | ICD-10-CM | POA: Diagnosis not present

## 2022-04-01 DIAGNOSIS — L821 Other seborrheic keratosis: Secondary | ICD-10-CM | POA: Diagnosis not present

## 2022-04-01 DIAGNOSIS — D2261 Melanocytic nevi of right upper limb, including shoulder: Secondary | ICD-10-CM | POA: Diagnosis not present

## 2022-04-01 DIAGNOSIS — L448 Other specified papulosquamous disorders: Secondary | ICD-10-CM | POA: Diagnosis not present

## 2022-04-02 DIAGNOSIS — F330 Major depressive disorder, recurrent, mild: Secondary | ICD-10-CM | POA: Diagnosis not present

## 2022-04-02 DIAGNOSIS — N951 Menopausal and female climacteric states: Secondary | ICD-10-CM | POA: Diagnosis not present

## 2022-04-02 DIAGNOSIS — Z6824 Body mass index (BMI) 24.0-24.9, adult: Secondary | ICD-10-CM | POA: Diagnosis not present

## 2022-04-02 DIAGNOSIS — E038 Other specified hypothyroidism: Secondary | ICD-10-CM | POA: Diagnosis not present

## 2022-06-15 DIAGNOSIS — R7303 Prediabetes: Secondary | ICD-10-CM | POA: Diagnosis not present

## 2022-06-15 DIAGNOSIS — E559 Vitamin D deficiency, unspecified: Secondary | ICD-10-CM | POA: Diagnosis not present

## 2022-06-15 DIAGNOSIS — E039 Hypothyroidism, unspecified: Secondary | ICD-10-CM | POA: Diagnosis not present

## 2022-06-15 DIAGNOSIS — E782 Mixed hyperlipidemia: Secondary | ICD-10-CM | POA: Diagnosis not present

## 2022-07-02 DIAGNOSIS — N951 Menopausal and female climacteric states: Secondary | ICD-10-CM | POA: Diagnosis not present

## 2022-07-02 DIAGNOSIS — E038 Other specified hypothyroidism: Secondary | ICD-10-CM | POA: Diagnosis not present

## 2022-07-02 DIAGNOSIS — R6882 Decreased libido: Secondary | ICD-10-CM | POA: Diagnosis not present

## 2022-07-02 DIAGNOSIS — Z6823 Body mass index (BMI) 23.0-23.9, adult: Secondary | ICD-10-CM | POA: Diagnosis not present

## 2022-08-17 ENCOUNTER — Telehealth: Payer: Self-pay | Admitting: Family Medicine

## 2022-08-17 ENCOUNTER — Telehealth: Payer: Self-pay

## 2022-08-17 DIAGNOSIS — Z780 Asymptomatic menopausal state: Secondary | ICD-10-CM

## 2022-08-17 DIAGNOSIS — E2839 Other primary ovarian failure: Secondary | ICD-10-CM

## 2022-08-17 NOTE — Telephone Encounter (Signed)
Pt is calling to request an order for Bone Density. Pt has mammogram scheduled 2/8/24at [facility] Please advise CB- [phone]

## 2022-08-17 NOTE — Telephone Encounter (Signed)
Duplicate message. 

## 2022-08-17 NOTE — Telephone Encounter (Signed)
Copied from [place] [id number]. Topic: General - Inquiry >> Aug 17, 2022  1:58 PM Jodi Day wrote: Reason for CRM: Pt stated has a mammogram scheduled February 8th, 2024, with [facility] mammography in [place] and would like order sent to schedule her bone density as well.  Please advise.

## 2022-08-18 NOTE — Telephone Encounter (Signed)
Left message advising order placed.

## 2022-08-18 NOTE — Telephone Encounter (Signed)
Order placed

## 2022-08-19 ENCOUNTER — Ambulatory Visit (INDEPENDENT_AMBULATORY_CARE_PROVIDER_SITE_OTHER): Payer: Medicare Other

## 2022-08-19 DIAGNOSIS — Z23 Encounter for immunization: Secondary | ICD-10-CM

## 2022-09-04 DIAGNOSIS — E559 Vitamin D deficiency, unspecified: Secondary | ICD-10-CM | POA: Diagnosis not present

## 2022-09-04 DIAGNOSIS — E039 Hypothyroidism, unspecified: Secondary | ICD-10-CM | POA: Diagnosis not present

## 2022-09-29 DIAGNOSIS — N951 Menopausal and female climacteric states: Secondary | ICD-10-CM | POA: Diagnosis not present

## 2022-09-29 DIAGNOSIS — E038 Other specified hypothyroidism: Secondary | ICD-10-CM | POA: Diagnosis not present

## 2022-10-01 DIAGNOSIS — Z79890 Hormone replacement therapy (postmenopausal): Secondary | ICD-10-CM | POA: Diagnosis not present

## 2022-10-01 DIAGNOSIS — E038 Other specified hypothyroidism: Secondary | ICD-10-CM | POA: Diagnosis not present

## 2022-10-01 DIAGNOSIS — N951 Menopausal and female climacteric states: Secondary | ICD-10-CM | POA: Diagnosis not present

## 2022-10-01 DIAGNOSIS — Z6823 Body mass index (BMI) 23.0-23.9, adult: Secondary | ICD-10-CM | POA: Diagnosis not present

## 2022-10-06 DIAGNOSIS — L578 Other skin changes due to chronic exposure to nonionizing radiation: Secondary | ICD-10-CM | POA: Diagnosis not present

## 2022-10-06 DIAGNOSIS — L821 Other seborrheic keratosis: Secondary | ICD-10-CM | POA: Diagnosis not present

## 2022-10-06 DIAGNOSIS — D225 Melanocytic nevi of trunk: Secondary | ICD-10-CM | POA: Diagnosis not present

## 2022-10-06 DIAGNOSIS — H524 Presbyopia: Secondary | ICD-10-CM | POA: Diagnosis not present

## 2022-10-06 DIAGNOSIS — Z0100 Encounter for examination of eyes and vision without abnormal findings: Secondary | ICD-10-CM | POA: Diagnosis not present

## 2022-10-06 DIAGNOSIS — L570 Actinic keratosis: Secondary | ICD-10-CM | POA: Diagnosis not present

## 2022-12-24 DIAGNOSIS — Z13820 Encounter for screening for osteoporosis: Secondary | ICD-10-CM | POA: Diagnosis not present

## 2022-12-24 DIAGNOSIS — Z1231 Encounter for screening mammogram for malignant neoplasm of breast: Secondary | ICD-10-CM | POA: Diagnosis not present

## 2022-12-24 LAB — HM MAMMOGRAPHY

## 2022-12-24 LAB — HM DEXA SCAN: HM Dexa Scan: NORMAL

## 2022-12-29 ENCOUNTER — Encounter: Payer: Self-pay | Admitting: Family Medicine

## 2022-12-29 DIAGNOSIS — R928 Other abnormal and inconclusive findings on diagnostic imaging of breast: Secondary | ICD-10-CM | POA: Diagnosis not present

## 2022-12-29 DIAGNOSIS — N951 Menopausal and female climacteric states: Secondary | ICD-10-CM | POA: Diagnosis not present

## 2022-12-31 DIAGNOSIS — R5383 Other fatigue: Secondary | ICD-10-CM | POA: Diagnosis not present

## 2022-12-31 DIAGNOSIS — R6882 Decreased libido: Secondary | ICD-10-CM | POA: Diagnosis not present

## 2022-12-31 DIAGNOSIS — N951 Menopausal and female climacteric states: Secondary | ICD-10-CM | POA: Diagnosis not present

## 2022-12-31 DIAGNOSIS — E038 Other specified hypothyroidism: Secondary | ICD-10-CM | POA: Diagnosis not present

## 2023-01-19 ENCOUNTER — Ambulatory Visit (INDEPENDENT_AMBULATORY_CARE_PROVIDER_SITE_OTHER): Payer: Medicare Other

## 2023-01-19 VITALS — Ht 69.0 in | Wt 165.0 lb

## 2023-01-19 DIAGNOSIS — Z1211 Encounter for screening for malignant neoplasm of colon: Secondary | ICD-10-CM | POA: Diagnosis not present

## 2023-01-19 DIAGNOSIS — Z0000 Encounter for general adult medical examination without abnormal findings: Secondary | ICD-10-CM

## 2023-01-19 NOTE — Patient Instructions (Signed)
Jodi Day , Thank you for taking time to come for your Medicare Wellness Visit. I appreciate your ongoing commitment to your health goals. Please review the following plan we discussed and let me know if I can assist you in the future.   These are the goals we discussed:  Goals      DIET - EAT MORE FRUITS AND VEGETABLES     DIET - REDUCE CALORIE INTAKE     Recommend to cut back on carbohydrates in daily diet (avoid breads).         This is a list of the screening recommended for you and due dates:  Health Maintenance  Topic Date Due   COVID-19 Vaccine (4 - 2023-24 season) 07/17/2022   Colon Cancer Screening  01/04/2023   Mammogram  12/30/2023   Medicare Annual Wellness Visit  01/19/2024   DTaP/Tdap/Td vaccine (3 - Td or Tdap) 10/21/2027   DEXA scan (bone density measurement)  12/25/2027   Pneumonia Vaccine  Completed   Flu Shot  Completed   Hepatitis C Screening: USPSTF Recommendation to screen - Ages 64-79 yo.  Completed   Zoster (Shingles) Vaccine  Completed   HPV Vaccine  Aged Out    Advanced directives: no  Conditions/risks identified: none  Next appointment: Follow up in one year for your annual wellness visit 01/24/2024 '@1'$ :30pm   Preventive Care 65 Years and Older, Female Preventive care refers to lifestyle choices and visits with your health care provider that can promote health and wellness. What does preventive care include? A yearly physical exam. This is also called an annual well check. Dental exams once or twice a year. Routine eye exams. Ask your health care provider how often you should have your eyes checked. Personal lifestyle choices, including: Daily care of your teeth and gums. Regular physical activity. Eating a healthy diet. Avoiding tobacco and drug use. Limiting alcohol use. Practicing safe sex. Taking low-dose aspirin every day. Taking vitamin and mineral supplements as recommended by your health care provider. What happens during an annual  well check? The services and screenings done by your health care provider during your annual well check will depend on your age, overall health, lifestyle risk factors, and family history of disease. Counseling  Your health care provider may ask you questions about your: Alcohol use. Tobacco use. Drug use. Emotional well-being. Home and relationship well-being. Sexual activity. Eating habits. History of falls. Memory and ability to understand (cognition). Work and work Statistician. Reproductive health. Screening  You may have the following tests or measurements: Height, weight, and BMI. Blood pressure. Lipid and cholesterol levels. These may be checked every 5 years, or more frequently if you are over 38 years old. Skin check. Lung cancer screening. You may have this screening every year starting at age 59 if you have a 30-pack-year history of smoking and currently smoke or have quit within the past 15 years. Fecal occult blood test (FOBT) of the stool. You may have this test every year starting at age 27. Flexible sigmoidoscopy or colonoscopy. You may have a sigmoidoscopy every 5 years or a colonoscopy every 10 years starting at age 29. Hepatitis C blood test. Hepatitis B blood test. Sexually transmitted disease (STD) testing. Diabetes screening. This is done by checking your blood sugar (glucose) after you have not eaten for a while (fasting). You may have this done every 1-3 years. Bone density scan. This is done to screen for osteoporosis. You may have this done starting at age 66. Mammogram.  This may be done every 1-2 years. Talk to your health care provider about how often you should have regular mammograms. Talk with your health care provider about your test results, treatment options, and if necessary, the need for more tests. Vaccines  Your health care provider may recommend certain vaccines, such as: Influenza vaccine. This is recommended every year. Tetanus, diphtheria,  and acellular pertussis (Tdap, Td) vaccine. You may need a Td booster every 10 years. Zoster vaccine. You may need this after age 70. Pneumococcal 13-valent conjugate (PCV13) vaccine. One dose is recommended after age 70. Pneumococcal polysaccharide (PPSV23) vaccine. One dose is recommended after age 44. Talk to your health care provider about which screenings and vaccines you need and how often you need them. This information is not intended to replace advice given to you by your health care provider. Make sure you discuss any questions you have with your health care provider. Document Released: 11/29/2015 Document Revised: 07/22/2016 Document Reviewed: 09/03/2015 Elsevier Interactive Patient Education  2017 [place] Prevention in the Home Falls can cause injuries. They can happen to people of all ages. There are many things you can do to make your home safe and to help prevent falls. What can I do on the outside of my home? Regularly fix the edges of walkways and driveways and fix any cracks. Remove anything that might make you trip as you walk through a door, such as a raised step or threshold. Trim any bushes or trees on the path to your home. Use bright outdoor lighting. Clear any walking paths of anything that might make someone trip, such as rocks or tools. Regularly check to see if handrails are loose or broken. Make sure that both sides of any steps have handrails. Any raised decks and porches should have guardrails on the edges. Have any leaves, snow, or ice cleared regularly. Use sand or salt on walking paths during winter. Clean up any spills in your garage right away. This includes oil or grease spills. What can I do in the bathroom? Use night lights. Install grab bars by the toilet and in the tub and shower. Do not use towel bars as grab bars. Use non-skid mats or decals in the tub or shower. If you need to sit down in the shower, use a plastic, non-slip  stool. Keep the floor dry. Clean up any water that spills on the floor as soon as it happens. Remove soap buildup in the tub or shower regularly. Attach bath mats securely with double-sided non-slip rug tape. Do not have throw rugs and other things on the floor that can make you trip. What can I do in the bedroom? Use night lights. Make sure that you have a light by your bed that is easy to reach. Do not use any sheets or blankets that are too big for your bed. They should not hang down onto the floor. Have a firm chair that has side arms. You can use this for support while you get dressed. Do not have throw rugs and other things on the floor that can make you trip. What can I do in the kitchen? Clean up any spills right away. Avoid walking on wet floors. Keep items that you use a lot in easy-to-reach places. If you need to reach something above you, use a strong step stool that has a grab bar. Keep electrical cords out of the way. Do not use floor polish or wax that makes floors slippery. If you  must use wax, use non-skid floor wax. Do not have throw rugs and other things on the floor that can make you trip. What can I do with my stairs? Do not leave any items on the stairs. Make sure that there are handrails on both sides of the stairs and use them. Fix handrails that are broken or loose. Make sure that handrails are as long as the stairways. Check any carpeting to make sure that it is firmly attached to the stairs. Fix any carpet that is loose or worn. Avoid having throw rugs at the top or bottom of the stairs. If you do have throw rugs, attach them to the floor with carpet tape. Make sure that you have a light switch at the top of the stairs and the bottom of the stairs. If you do not have them, ask someone to add them for you. What else can I do to help prevent falls? Wear shoes that: Do not have high heels. Have rubber bottoms. Are comfortable and fit you well. Are closed at the  toe. Do not wear sandals. If you use a stepladder: Make sure that it is fully opened. Do not climb a closed stepladder. Make sure that both sides of the stepladder are locked into place. Ask someone to hold it for you, if possible. Clearly mark and make sure that you can see: Any grab bars or handrails. First and last steps. Where the edge of each step is. Use tools that help you move around (mobility aids) if they are needed. These include: Canes. Walkers. Scooters. Crutches. Turn on the lights when you go into a dark area. Replace any light bulbs as soon as they burn out. Set up your furniture so you have a clear path. Avoid moving your furniture around. If any of your floors are uneven, fix them. If there are any pets around you, be aware of where they are. Review your medicines with your doctor. Some medicines can make you feel dizzy. This can increase your chance of falling. Ask your doctor what other things that you can do to help prevent falls. This information is not intended to replace advice given to you by your health care provider. Make sure you discuss any questions you have with your health care provider. Document Released: 08/29/2009 Document Revised: 04/09/2016 Document Reviewed: 12/07/2014 Elsevier Interactive Patient Education  2017 [employer].

## 2023-01-19 NOTE — Progress Notes (Signed)
I connected with  Candee Furbish on 01/19/23 by a audio enabled telemedicine application and verified that I am speaking with the correct person using two identifiers.  Patient Location: Home  Provider Location: Home Office  I discussed the limitations of evaluation and management by telemedicine. The patient expressed understanding and agreed to proceed.  Subjective:   Jodi Day is a 77 y.o. female who presents for Medicare Annual (Subsequent) preventive examination.  Review of Systems     Cardiac Risk Factors include: advanced age (>52mn, >>32women);dyslipidemia     Objective:    Today's Vitals   01/19/23 1336  Weight: 165 lb (74.8 kg)  Height: '5\' 9"'$  (1.753 m)   Body mass index is 24.37 kg/m.     01/19/2023    1:51 PM 01/15/2022    1:19 PM 01/09/2021   11:29 AM 01/08/2020    1:32 PM 11/02/2018    8:59 PM 10/19/2018    9:24 AM 09/23/2018   11:11 AM  Advanced Directives  Does Patient Have a Medical Advance Directive? No No No No No No No  Would patient like information on creating a medical advance directive?  No - Patient declined No - Patient declined No - Patient declined No - Patient declined No - Patient declined No - Patient declined    Current Medications (verified) Outpatient Encounter Medications as of 01/19/2023  Medication Sig   Acetylcysteine (NAC PO) Take 1.2 g by mouth.   Alpha-Lipoic Acid 150 MG CAPS Take by mouth.   Apple Cider Vinegar 500 MG TABS Take by mouth.   b complex vitamins tablet Take 1 tablet by mouth daily. '20mg'$    Barberry-Oreg Grape-Goldenseal (BERBERINE COMPLEX PO) Take 1,000 mg by mouth.   Chromium (CHROMEMATE PO) Take 600 mcg by mouth.   CINNAMON PO Take 1,200 mg by mouth.   co-enzyme Q-10 30 MG capsule Take 100 mg by mouth daily.   DHEA 10 MG TABS Take 10 mg by mouth daily.   fluconazole (DIFLUCAN) 200 MG tablet Take 200 mg by mouth daily.   metFORMIN (GLUCOPHAGE-XR) 500 MG 24 hr tablet Take 1 tablet (500 mg total) by mouth 2 (two)  times daily.   NON FORMULARY Take 1 capsule by mouth daily at 6 (six) AM. "Eye Sight"   OVER THE COUNTER MEDICATION Take 12.5 mg by mouth daily. Iodine Complex   OVER THE COUNTER MEDICATION AvinoCort   OVER THE COUNTER MEDICATION GYMNEMA '400MG'$ /'300MG'$    OVER THE COUNTER MEDICATION KoncentratedK (vit K1 50064m/Vit K2 '25mg'$ /Vit K2 '25mg'$ /Vit K2.'5mg'$ /Astaxanthin '2mg'$ )   OVER THE COUNTER MEDICATION 500 mg. Phosphatidly Serine   PRESCRIPTION MEDICATION Place 1 application rectally every 4 (four) months. Hormone replacement therapy pellet inserted rectally every 4 months. Dosing depends on blood work   progesterone (PROMETRIUM) 200 MG capsule Take 200 mg by mouth daily.    SELENIUM PO Take 200 mcg by mouth.   SYNTHROID 125 MCG tablet Take 125 mcg by mouth daily before breakfast.    VITAMIN D, CHOLECALCIFEROL, PO Take 10,000 Units by mouth daily.    liothyronine (CYTOMEL) 5 MCG tablet Take 5 mcg by mouth daily. (Patient not taking: Reported on 01/19/2023)   rosuvastatin (CRESTOR) 5 MG tablet Take 1 tablet (5 mg total) by mouth daily. (Patient not taking: Reported on 01/19/2023)   No facility-administered encounter medications on file as of 01/19/2023.    Allergies (verified) Penicillins   History: Past Medical History:  Diagnosis Date   Anxiety    Diabetes mellitus without complication (  [place])    Goiter, non-toxic 07/04/2007   Hyperlipidemia    Hypothyroidism    Thyroid disease    Past Surgical History:  Procedure Laterality Date   ABDOMINAL HYSTERECTOMY  1992   fibroids. ovaries intact   CHOLECYSTECTOMY  1996   EYE SURGERY Bilateral 2014   cataract surgery   KNEE ARTHROPLASTY Right 11/02/2018   Procedure: COMPUTER ASSISTED TOTAL KNEE ARTHROPLASTY;  Surgeon: Dereck Leep, MD;  Location: [facility] ORS;  Service: Orthopedics;  Laterality: Right;   thyroidectomy  1973   tonsillectomy and adenoidectomy  1973   Family History  Problem Relation Age of Onset   Diabetes Mother    Hypothyroidism Mother     Stroke Mother    COPD Father    Prostate cancer Brother    Pneumonia Brother        spesis from pneumonia   Prostate cancer Brother    Colon polyps Brother    Parkinson's disease Brother    Cancer Brother        lung cancer   Social History   Socioeconomic History   Marital status: Married    Spouse name: [name]   Number of children: 2   Years of education: Associates   Highest education level: Associate degree: occupational, Hotel manager, or vocational program  Occupational History   Occupation: Self Employeed owns a Scientist, research (life sciences) estate business    Comment: retired  Tobacco Use   Smoking status: Former    Packs/day: 1.00    Years: 7.00    Total pack years: 7.00    Types: Cigarettes    Quit date: 11/15/1977    Years since quitting: 45.2   Smokeless tobacco: Never  Vaping Use   Vaping Use: Never used  Substance and Sexual Activity   Alcohol use: Yes    Alcohol/week: 1.0 standard drink of alcohol    Types: 1 Glasses of wine per week   Drug use: No   Sexual activity: Yes  Other Topics Concern   Not on file  Social History Narrative   Not on file   Social Determinants of Health   Financial Resource Strain: Low Risk  (01/19/2023)   Overall Financial Resource Strain (CARDIA)    Difficulty of Paying Living Expenses: Not hard at all  Food Insecurity: No Food Insecurity (01/19/2023)   Hunger Vital Sign    Worried About Running Out of Food in the Last Year: Never true    Ran Out of Food in the Last Year: Never true  Transportation Needs: No Transportation Needs (01/19/2023)   PRAPARE - Hydrologist (Medical): No    Lack of Transportation (Non-Medical): No  Physical Activity: Sufficiently Active (01/19/2023)   Exercise Vital Sign    Days of Exercise per Week: 4 days    Minutes of Exercise per Session: 50 min  Stress: No Stress Concern Present (01/19/2023)   Chance    Feeling of Stress  : Not at all  Social Connections: Moderately Integrated (01/19/2023)   Social Connection and Isolation Panel [NHANES]    Frequency of Communication with Friends and Family: More than three times a week    Frequency of Social Gatherings with Friends and Family: Once a week    Attends Religious Services: 1 to 4 times per year    Active Member of [employer] or Organizations: No    Attends Archivist Meetings: Never    Marital Status: Married  Tobacco Counseling Counseling given: Not Answered   Clinical Intake:  Pre-visit preparation completed: Yes  Pain : No/denies pain     BMI - recorded: 24.37 Nutritional Status: BMI of 19-24  Normal Nutritional Risks: None Diabetes: No  How often do you need to have someone help you when you read instructions, pamphlets, or other written materials from your doctor or pharmacy?: 1 - Never  Diabetic?no  Interpreter Needed?: No  Information entered by :: B.Lonell Stamos,LPN   Activities of Daily Living    01/19/2023    1:51 PM  In your present state of health, do you have any difficulty performing the following activities:  Hearing? 0  Vision? 0  Difficulty concentrating or making decisions? 0  Walking or climbing stairs? 0  Dressing or bathing? 0  Doing errands, shopping? 0  Preparing Food and eating ? N  Using the Toilet? N  In the past six months, have you accidently leaked urine? N  Do you have problems with loss of bowel control? N  Managing your Medications? N  Managing your Finances? N  Housekeeping or managing your Housekeeping? N    Patient Care Team: Virginia Crews, MD as PCP - General (Family Medicine) Margaretha Sheffield, MD (Otolaryngology) System, Provider Not In (Ophthalmology) System, Provider Not In (Dermatology)  Indicate any recent Medical Services you may have received from other than [facility] providers in the past year (date may be approximate).     Assessment:   This is a routine wellness examination for  Blannie.  Hearing/Vision screen Hearing Screening - Comments:: Adequate hearing : ringing in the ears Vision Screening - Comments:: Adequate vision;cataract surgery; wears glasses for driving [place] issues and exercise activities discussed: Current Exercise Habits: Home exercise routine;Structured exercise class, Type of exercise: walking;stretching (water aerobics), Time (Minutes): > 60, Frequency (Times/Week): 4, Weekly Exercise (Minutes/Week): 0, Intensity: Moderate, Exercise limited by: None identified   Goals Addressed             This Visit's Progress    DIET - EAT MORE FRUITS AND VEGETABLES   On track    DIET - REDUCE CALORIE INTAKE   On track    Recommend to cut back on carbohydrates in daily diet (avoid breads).        Depression Screen    01/19/2023    1:46 PM 01/22/2022    3:02 PM 01/15/2022    1:16 PM 01/20/2021    3:11 PM 01/09/2021   11:26 AM 01/08/2020    1:27 PM 01/05/2019    9:56 AM  PHQ 2/9 Scores  PHQ - 2 Score 2 0 0 0 0 0 0  PHQ- 9 Score 2 1  0   1    Fall Risk    01/19/2023    1:38 PM 01/22/2022    3:02 PM 01/15/2022    1:21 PM 01/14/2022    4:30 PM 01/09/2021   11:30 AM  Alpha in the past year? 0 0 0 0 0  Number falls in past yr: 0 0 0  0  Injury with Fall? 0 0 0  0  Risk for fall due to : No Fall Risks No Fall Risks No Fall Risks    Follow up Education provided;Falls prevention discussed Falls evaluation completed Falls evaluation completed      FALL RISK PREVENTION PERTAINING TO THE HOME:  Any stairs in or around the home? Yes  If so, are there any without handrails? Yes  Home free of loose throw rugs in walkways, pet beds, electrical cords, etc? Yes  Adequate lighting in your home to reduce risk of falls? Yes   ASSISTIVE DEVICES UTILIZED TO PREVENT FALLS:  Life alert? No  Use of a cane, walker or w/c? Yes  Grab bars in the bathroom? no Shower chair or bench in shower? Yes  Elevated toilet seat or a handicapped toilet?  No    Cognitive Function:        01/19/2023    1:54 PM 01/20/2021    2:05 PM 01/08/2020    1:36 PM 09/22/2017   11:20 AM  6CIT Screen  What Year? 0 points 0 points 0 points 0 points  What month? 0 points 0 points 0 points 0 points  What time? 0 points 0 points 0 points 0 points  Count back from 20 0 points 0 points 0 points 0 points  Months in reverse 0 points 0 points 2 points 2 points  Repeat phrase 2 points 0 points 0 points 0 points  Total Score 2 points 0 points 2 points 2 points    Immunizations Immunization History  Administered Date(s) Administered   Fluad Quad(high Dose 65+) 07/25/2019, 08/06/2021, 08/19/2022   Influenza, High Dose Seasonal PF 07/30/2020   Influenza-Unspecified 08/15/2018   PFIZER(Purple Top)SARS-COV-2 Vaccination 12/07/2019, 12/25/2019, 08/17/2020   Pneumococcal Conjugate-13 01/08/2015   Pneumococcal Polysaccharide-23 01/15/2016   Td 10/20/2017   Tdap 07/01/2007   Zoster Recombinat (Shingrix) 12/27/2017, 02/28/2018   Zoster, Live 01/04/2009    TDAP status: Up to date  Flu Vaccine status: Up to date  Pneumococcal vaccine status: Up to date  Covid-19 vaccine status: Completed vaccines  Qualifies for Shingles Vaccine? Yes   Zostavax completed yes Shingrix Completed?: Yes  Screening Tests Health Maintenance  Topic Date Due   COVID-19 Vaccine (4 - 2023-24 season) 07/17/2022   COLONOSCOPY (Pts 45-1yr Insurance coverage will need to be confirmed)  01/04/2023   MAMMOGRAM  12/30/2023   Medicare Annual Wellness (AWV)  01/19/2024   DTaP/Tdap/Td (3 - Td or Tdap) 10/21/2027   DEXA SCAN  12/25/2027   Pneumonia Vaccine 75 Years old  Completed   INFLUENZA VACCINE  Completed   Hepatitis C Screening  Completed   Zoster Vaccines- Shingrix  Completed   HPV VACCINES  Aged Out    Health Maintenance  Health Maintenance Due  Topic Date Due   COVID-19 Vaccine (4 - 2023-24 season) 07/17/2022   COLONOSCOPY (Pts 45-439yrInsurance coverage will need  to be confirmed)  01/04/2023    Colorectal cancer screening: Type of screening: Colonoscopy. Completed yes. Repeat every 5 years DUE order placed  Mammogram status: Completed yes. Repeat every year  Bone Density status: Completed yes. Results reflect: Bone density results: NORMAL. Repeat every 5 years.  Lung Cancer Screening: (Low Dose CT Chest recommended if Age 75-80ears, 30 pack-year currently smoking OR have quit w/in 15years.) does not qualify.   Lung Cancer Screening Referral: no  Additional Screening:  Hepatitis C Screening: does not qualify; Completed no  Vision Screening: Recommended annual ophthalmology exams for early detection of glaucoma and other disorders of the eye. Is the patient up to date with their annual eye exam?  Yes  Who is the provider or what is the name of the office in which the patient attends annual eye exams? [facility] is not established with a provider, would they like to be referred to a provider to establish care? No .   Dental Screening:  Recommended annual dental exams for proper oral hygiene  Community Resource Referral / Chronic Care Management: CRR required this visit?  No   CCM required this visit?  No      Plan:     I have personally reviewed and noted the following in the patient's chart:   Medical and social history Use of alcohol, tobacco or illicit drugs  Current medications and supplements including opioid prescriptions. Patient is not currently taking opioid prescriptions. Functional ability and status Nutritional status Physical activity Advanced directives List of other physicians Hospitalizations, surgeries, and ER visits in previous 12 months Vitals Screenings to include cognitive, depression, and falls Referrals and appointments  In addition, I have reviewed and discussed with patient certain preventive protocols, quality metrics, and best practice recommendations. A written personalized care plan for  preventive services as well as general preventive health recommendations were provided to patient.     Roger Shelter, LPN   [id number]   Nurse Notes: pt is doing well, she has lost 10lbs as she walks and exercises at the [facility] 4 days weekly.  *Pt does relay that she stopped taking the Crestor due to bad, debilitating cramps. *She also relays she stopped taking the Cytomel due to hair loss and thinning. Pt desires a referral to Endocrinology to manage thyroid.  Referral to Gastroenterology placed for colonoscopy that is due.

## 2023-01-25 ENCOUNTER — Ambulatory Visit (INDEPENDENT_AMBULATORY_CARE_PROVIDER_SITE_OTHER): Payer: Medicare Other | Admitting: Family Medicine

## 2023-01-25 ENCOUNTER — Encounter: Payer: Self-pay | Admitting: Family Medicine

## 2023-01-25 VITALS — BP 104/66 | HR 61 | Temp 98.3°F | Resp 12 | Ht 69.0 in | Wt 167.0 lb

## 2023-01-25 DIAGNOSIS — E781 Pure hyperglyceridemia: Secondary | ICD-10-CM

## 2023-01-25 DIAGNOSIS — E559 Vitamin D deficiency, unspecified: Secondary | ICD-10-CM | POA: Diagnosis not present

## 2023-01-25 DIAGNOSIS — R739 Hyperglycemia, unspecified: Secondary | ICD-10-CM | POA: Diagnosis not present

## 2023-01-25 DIAGNOSIS — E039 Hypothyroidism, unspecified: Secondary | ICD-10-CM

## 2023-01-25 DIAGNOSIS — Z0000 Encounter for general adult medical examination without abnormal findings: Secondary | ICD-10-CM

## 2023-01-25 MED ORDER — METFORMIN HCL ER 500 MG PO TB24: 500.0000 mg | ORAL_TABLET | Freq: Two times a day (BID) | ORAL | 1 refills | Status: AC

## 2023-01-25 NOTE — Assessment & Plan Note (Addendum)
Previously well controlled. Check lipid panel and CMP.  No longer on statin 2/2 myalgias

## 2023-01-25 NOTE — Assessment & Plan Note (Addendum)
Thinning hair may indicate decreased thyroid levels.  Previously well controlled Continue Synthroid at current dose  Recheck TSH and adjust Synthroid as indicated

## 2023-01-25 NOTE — Assessment & Plan Note (Addendum)
Blood glucose is well controlled on last A1c Check A1c

## 2023-01-25 NOTE — Progress Notes (Signed)
Complete physical exam  Patient: Jodi Day   DOB: 1946/09/01   77 y.o. Female  MRN: [id number]  Subjective:    Chief Complaint  Patient presents with   Annual Exam   Jodi Day is a 77 y.o. female who presents today for a complete physical exam. She reports consuming a  gluten an dairy free  diet.  Walks and swims for exercise  She generally feels well. She reports sleeping well. She does have additional problems to discuss today. She wishes to discus changing her statin, checking her thyroid, and shortness of breath.  Changing statin  Stopped taking crestor a month ago due to leg pain.   Thyroid Has noticed thinning and easily breaking hair and wants her thyroid levels checked today.   Shortness of breath Has noticed several acute episodes in the past month where she feels like she cannot catch her breath.  Does not note any inciting factors.  The sensation passes after taking a few deep breaths. Attributes it to her stress levels due to her husband's health concerns.   Most recent fall risk assessment:    01/25/2023    2:01 PM  [place] in the past year? 0  Number falls in past yr: 0  Injury with Fall? 0  Risk for fall due to : No Fall Risks  Follow up Falls evaluation completed     Most recent depression screenings:    01/25/2023    2:01 PM 01/19/2023    1:46 PM  PHQ 2/9 Scores  PHQ - 2 Score 1 2  PHQ- 9 Score 1 2        Patient Care Team: Virginia Crews, MD as PCP - General (Family Medicine) Margaretha Sheffield, MD (Otolaryngology) System, Provider Not In (Ophthalmology) System, Provider Not In (Dermatology)   Outpatient Medications Prior to Visit  Medication Sig   Acetylcysteine (NAC PO) Take 1.2 g by mouth.   b complex vitamins tablet Take 1 tablet by mouth daily. '20mg'$    Chromium (CHROMEMATE PO) Take 600 mcg by mouth.   CINNAMON PO Take 1,200 mg by mouth.   co-enzyme Q-10 30 MG capsule Take 100 mg by mouth daily.   DHEA 10 MG TABS  Take 10 mg by mouth daily.   NON FORMULARY Take 1 capsule by mouth daily at 6 (six) AM. "Eye Sight"   OVER THE COUNTER MEDICATION Take 12.5 mg by mouth daily. Iodine Complex   OVER THE COUNTER MEDICATION AvinoCort   OVER THE COUNTER MEDICATION GYMNEMA '400MG'$ /'300MG'$    OVER THE COUNTER MEDICATION KoncentratedK (vit K1 5033mg/Vit K2 '25mg'$ /Vit K2 '25mg'$ /Vit K2.'5mg'$ /Astaxanthin '2mg'$ )   OVER THE COUNTER MEDICATION 500 mg. Phosphatidly Serine   PRESCRIPTION MEDICATION Place 1 application rectally every 4 (four) months. Hormone replacement therapy pellet inserted rectally every 4 months. Dosing depends on blood work   SELENIUM PO Take 200 mcg by mouth.   SYNTHROID 125 MCG tablet Take 125 mcg by mouth daily before breakfast.    VITAMIN D, CHOLECALCIFEROL, PO Take 10,000 Units by mouth daily.    [DISCONTINUED] metFORMIN (GLUCOPHAGE-XR) 500 MG 24 hr tablet Take 1 tablet (500 mg total) by mouth 2 (two) times daily.   [DISCONTINUED] Alpha-Lipoic Acid 150 MG CAPS Take by mouth. (Patient not taking: Reported on 01/25/2023)   [DISCONTINUED] Apple Cider Vinegar 500 MG TABS Take by mouth. (Patient not taking: Reported on 01/25/2023)   [DISCONTINUED] Barberry-Oreg Grape-Goldenseal (BERBERINE COMPLEX PO) Take 1,000 mg by mouth. (Patient not taking: Reported  on 01/25/2023)   [DISCONTINUED] fluconazole (DIFLUCAN) 200 MG tablet Take 200 mg by mouth daily. (Patient not taking: Reported on 01/25/2023)   [DISCONTINUED] liothyronine (CYTOMEL) 5 MCG tablet Take 5 mcg by mouth daily. (Patient not taking: Reported on 01/25/2023)   [DISCONTINUED] progesterone (PROMETRIUM) 200 MG capsule Take 200 mg by mouth daily.  (Patient not taking: Reported on 01/25/2023)   [DISCONTINUED] rosuvastatin (CRESTOR) 5 MG tablet Take 1 tablet (5 mg total) by mouth daily. (Patient not taking: Reported on 01/25/2023)   No facility-administered medications prior to visit.    Review of Systems  Constitutional:  Negative for chills and fever.  HENT:   Positive for tinnitus. Negative for ear pain and hearing loss.   Eyes: Negative.   Respiratory:  Positive for shortness of breath. Negative for cough.   Cardiovascular:  Negative for chest pain, palpitations and leg swelling.  Gastrointestinal: Negative.   Genitourinary: Negative.   Neurological:  Negative for dizziness and headaches.  Psychiatric/Behavioral:  Negative for depression.     Tinnitus noted at night in both ears. Contributes to frequent loud noise exposure in childhood.       Objective:     BP 104/66 (BP Location: Right Arm, Patient Position: Sitting, Cuff Size: Large)   Pulse 61   Temp 98.3 F (36.8 C) (Oral)   Resp 12   Ht '5\' 9"'$  (1.753 m)   Wt 167 lb (75.8 kg)   BMI 24.66 kg/m    Physical Exam Constitutional:      Appearance: Normal appearance. She is normal weight.  HENT:     Head: Normocephalic and atraumatic.     Right Ear: Tympanic membrane, ear canal and external ear normal.     Left Ear: Tympanic membrane, ear canal and external ear normal.  Eyes:     Extraocular Movements: Extraocular movements intact.     Pupils: Pupils are equal, round, and reactive to light.  Cardiovascular:     Pulses: Normal pulses.     Heart sounds: Normal heart sounds.  Pulmonary:     Effort: Pulmonary effort is normal.     Breath sounds: Normal breath sounds.  Abdominal:     General: Abdomen is flat.     Palpations: Abdomen is soft.  Musculoskeletal:     Cervical back: Neck supple.     Right lower leg: No edema.     Left lower leg: No edema.  Skin:    General: Skin is warm and dry.  Neurological:     Mental Status: She is alert.  Psychiatric:        Mood and Affect: Mood normal.        Behavior: Behavior normal.      No results found for any visits on 01/25/23.     Assessment & Plan:    Routine Health Maintenance and Physical Exam  Immunization History  Administered Date(s) Administered   Fluad Quad(high Dose 65+) 07/25/2019, 08/06/2021, 08/19/2022    Influenza, High Dose Seasonal PF 07/30/2020   Influenza-Unspecified 08/15/2018   PFIZER(Purple Top)SARS-COV-2 Vaccination 12/07/2019, 12/25/2019, 08/17/2020   Pneumococcal Conjugate-13 01/08/2015   Pneumococcal Polysaccharide-23 01/15/2016   Td 10/20/2017   Tdap 07/01/2007   Zoster Recombinat (Shingrix) 12/27/2017, 02/28/2018   Zoster, Live 01/04/2009    Health Maintenance  Topic Date Due   COVID-19 Vaccine (4 - 2023-24 season) 07/17/2022   COLONOSCOPY (Pts 45-70yr Insurance coverage will need to be confirmed)  01/04/2023   MAMMOGRAM  12/30/2023   Medicare Annual Wellness (AWV)  01/19/2024  DTaP/Tdap/Td (3 - Td or Tdap) 10/21/2027   DEXA SCAN  12/25/2027   Pneumonia Vaccine 35+ Years old  Completed   INFLUENZA VACCINE  Completed   Hepatitis C Screening  Completed   Zoster Vaccines- Shingrix  Completed   HPV VACCINES  Aged Out    Discussed health benefits of physical activity, and encouraged her to engage in regular exercise appropriate for her age and condition.  Problem List Items Addressed This Visit       Endocrine   Adult hypothyroidism    Thinning hair may indicate decreased thyroid levels.  Previously well controlled Continue Synthroid at current dose  Recheck TSH and adjust Synthroid as indicated        Relevant Orders   TSH     Other   Blood glucose elevated    Blood glucose is well controlled on last A1c Check A1c      Relevant Orders   Hemoglobin A1c   Hypertriglyceridemia    Previously well controlled. Check lipid panel and CMP.  No longer on statin 2/2 myalgias      Relevant Orders   Comprehensive metabolic panel   Lipid panel   Avitaminosis D    Well controlled with dietary supplements.  Check vitamin D level.      Relevant Orders   VITAMIN D 25 Hydroxy (Vit-D Deficiency, Fractures)   Other Visit Diagnoses     Encounter for annual physical exam    -  Primary   Relevant Orders   Comprehensive metabolic panel   Lipid panel   TSH    VITAMIN D 25 Hydroxy (Vit-D Deficiency, Fractures)   Hemoglobin A1c      Shortness of breath Given the onset being around the same time as her personal life stressors, and the random nature of the episodes, this is likely a somatic symptom connected to adjustment to life stressors.   Return in about 1 year (around 01/25/2024) for CPE.     Wynona Dove, Medical Student   Patient seen along with MS3 student Everlene Balls. I personally evaluated this patient along with the student, and verified all aspects of the history, physical exam, and medical decision making as documented by the student. I agree with the student's documentation and have made all necessary edits.  Andon Villard, Dionne Bucy, MD, MPH [facility]

## 2023-01-25 NOTE — Patient Instructions (Signed)
[facility] - [phone] 

## 2023-01-25 NOTE — Assessment & Plan Note (Addendum)
Well controlled with dietary supplements.  Check vitamin D level.

## 2023-01-26 LAB — COMPREHENSIVE METABOLIC PANEL
ALT: 21 IU/L (ref 0–32)
AST: 24 IU/L (ref 0–40)
Albumin/Globulin Ratio: 2 (ref 1.2–2.2)
Albumin: 4.5 g/dL (ref 3.8–4.8)
Alkaline Phosphatase: 76 IU/L (ref 44–121)
BUN/Creatinine Ratio: 19 (ref 12–28)
BUN: 15 mg/dL (ref 8–27)
Bilirubin Total: 0.5 mg/dL (ref 0.0–1.2)
CO2: 26 mmol/L (ref 20–29)
Calcium: 9.8 mg/dL (ref 8.7–10.3)
Chloride: 104 mmol/L (ref 96–106)
Creatinine, Ser: 0.8 mg/dL (ref 0.57–1.00)
Globulin, Total: 2.2 g/dL (ref 1.5–4.5)
Glucose: 89 mg/dL (ref 70–99)
Potassium: 4.4 mmol/L (ref 3.5–5.2)
Sodium: 140 mmol/L (ref 134–144)
Total Protein: 6.7 g/dL (ref 6.0–8.5)
eGFR: 77 mL/min/{1.73_m2} (ref >59)

## 2023-01-26 LAB — TSH: TSH: 1.35 u[IU]/mL (ref 0.450–4.500)

## 2023-01-26 LAB — LIPID PANEL
Chol/HDL Ratio: 2.8 ratio (ref 0.0–4.4)
Cholesterol, Total: 190 mg/dL (ref 100–199)
HDL: 67 mg/dL (ref >39)
LDL Chol Calc (NIH): 88 mg/dL (ref 0–99)
Triglycerides: 208 mg/dL — ABNORMAL HIGH (ref 0–149)
VLDL Cholesterol Cal: 35 mg/dL (ref 5–40)

## 2023-01-26 LAB — HEMOGLOBIN A1C
Est. average glucose Bld gHb Est-mCnc: 108 mg/dL
Hgb A1c MFr Bld: 5.4 % (ref 4.8–5.6)

## 2023-01-26 LAB — VITAMIN D 25 HYDROXY (VIT D DEFICIENCY, FRACTURES): Vit D, 25-Hydroxy: 52.7 ng/mL (ref 30.0–100.0)

## 2023-02-03 ENCOUNTER — Encounter: Payer: Self-pay | Admitting: *Deleted

## 2023-02-18 ENCOUNTER — Encounter: Payer: Self-pay | Admitting: *Deleted

## 2023-02-18 ENCOUNTER — Telehealth: Payer: Self-pay | Admitting: *Deleted

## 2023-02-18 ENCOUNTER — Other Ambulatory Visit: Payer: Self-pay | Admitting: *Deleted

## 2023-02-18 ENCOUNTER — Telehealth: Payer: Self-pay | Admitting: Gastroenterology

## 2023-02-18 DIAGNOSIS — Z1211 Encounter for screening for malignant neoplasm of colon: Secondary | ICD-10-CM

## 2023-02-18 DIAGNOSIS — Z86010 Personal history of colonic polyps: Secondary | ICD-10-CM

## 2023-02-18 MED ORDER — NA SULFATE-K SULFATE-MG SULF 17.5-3.13-1.6 GM/177ML PO SOLN: 1.0000 | Freq: Once | ORAL | 0 refills | Status: AC

## 2023-02-18 NOTE — Telephone Encounter (Signed)
Error

## 2023-02-18 NOTE — Telephone Encounter (Signed)
Gastroenterology Pre-Procedure Review  Request Date: 03/11/2023 Requesting Physician: Dr. Allen Norris  PATIENT REVIEW QUESTIONS: The patient responded to the following health history questions as indicated:    1. Are you having any GI issues? no 2. Do you have a personal history of Polyps? yes (11/03/2018) 3. Do you have a family history of Colon Cancer or Polyps? no 4. Diabetes Mellitus?  Pre-Diabetic and taking metformin 5. Joint replacements in the past 12 months?no 6. Major health problems in the past 3 months?no 7. Any artificial heart valves, MVP, or defibrillator?no    MEDICATIONS & ALLERGIES:    Patient reports the following regarding taking any anticoagulation/antiplatelet therapy:   Plavix, Coumadin, Eliquis, Xarelto, Lovenox, Pradaxa, Brilinta, or Effient? no Aspirin? no  Patient confirms/reports the following medications:  Current Outpatient Medications  Medication Sig Dispense Refill   Acetylcysteine (NAC PO) Take 1.2 g by mouth.     b complex vitamins tablet Take 1 tablet by mouth daily. 20mg      Chromium (CHROMEMATE PO) Take 600 mcg by mouth.     CINNAMON PO Take 1,200 mg by mouth.     co-enzyme Q-10 30 MG capsule Take 100 mg by mouth daily.     DHEA 10 MG TABS Take 10 mg by mouth daily.     metFORMIN (GLUCOPHAGE-XR) 500 MG 24 hr tablet Take 1 tablet (500 mg total) by mouth 2 (two) times daily. 180 tablet 1   NON FORMULARY Take 1 capsule by mouth daily at 6 (six) AM. "Eye Sight"     OVER THE COUNTER MEDICATION Take 12.5 mg by mouth daily. Iodine Complex     OVER THE COUNTER MEDICATION AvinoCort     OVER THE COUNTER MEDICATION GYMNEMA 400MG /300MG      OVER THE COUNTER MEDICATION KoncentratedK (vit K1 5062mcg/Vit K2 25mg /Vit K2 25mg /Vit K2.5mg /Astaxanthin 2mg )     OVER THE COUNTER MEDICATION 500 mg. Phosphatidly Serine     PRESCRIPTION MEDICATION Place 1 application rectally every 4 (four) months. Hormone replacement therapy pellet inserted rectally every 4 months. Dosing  depends on blood work     SELENIUM PO Take 200 mcg by mouth.     SYNTHROID 125 MCG tablet Take 125 mcg by mouth daily before breakfast.   10   VITAMIN D, CHOLECALCIFEROL, PO Take 10,000 Units by mouth daily.      No current facility-administered medications for this visit.    Patient confirms/reports the following allergies:  Allergies  Allergen Reactions   Penicillins Other (See Comments)    Unknown, per pt's mother. Has patient had a PCN reaction causing immediate rash, facial/tongue/throat swelling, SOB or lightheadedness with hypotension: Unknown Has patient had a PCN reaction causing severe rash involving mucus membranes or skin necrosis: Unknown Has patient had a PCN reaction that required hospitalization: No Has patient had a PCN reaction occurring within the last 10 years: No If all of the above answers are "NO", then may proceed with Cephalosporin use.     No orders of the defined types were placed in this encounter.   AUTHORIZATION INFORMATION Primary Insurance: 1D#: Group #:  Secondary Insurance: 1D#: Group #:  SCHEDULE INFORMATION: Date: 03/11/2023 Time: Location: [facility]

## 2023-02-18 NOTE — Telephone Encounter (Signed)
Colonoscopy schedule on 03/11/2023

## 2023-02-18 NOTE — Telephone Encounter (Signed)
Patient calling to schedule colonoscopy. Requests Dr. Allen Norris.

## 2023-03-10 ENCOUNTER — Encounter: Payer: Self-pay | Admitting: Gastroenterology

## 2023-03-10 DIAGNOSIS — E039 Hypothyroidism, unspecified: Secondary | ICD-10-CM | POA: Diagnosis not present

## 2023-03-11 ENCOUNTER — Ambulatory Visit: Payer: Medicare Other | Admitting: Anesthesiology

## 2023-03-11 ENCOUNTER — Ambulatory Visit
Admission: RE | Admit: 2023-03-11 | Discharge: 2023-03-11 | Disposition: A | Discharge status: Home / Self Care | Payer: Medicare Other | Source: Ambulatory Visit | Attending: Gastroenterology | Admitting: Gastroenterology

## 2023-03-11 ENCOUNTER — Encounter
Admission: RE | Disposition: A | Discharge status: Home / Self Care | Payer: Self-pay | Source: Ambulatory Visit | Attending: Gastroenterology

## 2023-03-11 DIAGNOSIS — Z860100 Personal history of colon polyps, unspecified: Secondary | ICD-10-CM

## 2023-03-11 DIAGNOSIS — Z87891 Personal history of nicotine dependence: Secondary | ICD-10-CM | POA: Diagnosis not present

## 2023-03-11 DIAGNOSIS — Z7984 Long term (current) use of oral hypoglycemic drugs: Secondary | ICD-10-CM | POA: Insufficient documentation

## 2023-03-11 DIAGNOSIS — E785 Hyperlipidemia, unspecified: Secondary | ICD-10-CM | POA: Insufficient documentation

## 2023-03-11 DIAGNOSIS — E039 Hypothyroidism, unspecified: Secondary | ICD-10-CM | POA: Insufficient documentation

## 2023-03-11 DIAGNOSIS — Z1211 Encounter for screening for malignant neoplasm of colon: Secondary | ICD-10-CM | POA: Insufficient documentation

## 2023-03-11 DIAGNOSIS — Z09 Encounter for follow-up examination after completed treatment for conditions other than malignant neoplasm: Secondary | ICD-10-CM | POA: Insufficient documentation

## 2023-03-11 DIAGNOSIS — E119 Type 2 diabetes mellitus without complications: Secondary | ICD-10-CM | POA: Diagnosis not present

## 2023-03-11 HISTORY — PX: COLONOSCOPY WITH PROPOFOL: SHX5780

## 2023-03-11 SURGERY — COLONOSCOPY WITH PROPOFOL
Anesthesia: General

## 2023-03-11 MED ORDER — PROPOFOL 500 MG/50ML IV EMUL
INTRAVENOUS | Status: DC | PRN
  Administered 2023-03-11: 165 ug/kg/min via INTRAVENOUS

## 2023-03-11 MED ORDER — SODIUM CHLORIDE 0.9 % IV SOLN: INTRAVENOUS | Status: DC

## 2023-03-11 MED ORDER — LIDOCAINE HCL (CARDIAC) PF 100 MG/5ML IV SOSY
PREFILLED_SYRINGE | INTRAVENOUS | Status: DC | PRN
  Administered 2023-03-11: 100 mg via INTRAVENOUS

## 2023-03-11 MED ORDER — PROPOFOL 10 MG/ML IV BOLUS
INTRAVENOUS | Status: DC | PRN
  Administered 2023-03-11: 70 mg via INTRAVENOUS

## 2023-03-11 NOTE — Op Note (Signed)
[facility] Gastroenterology Patient Name: Jodi Day Procedure Date: 03/11/2023 8:26 AM MRN: [id number] Account #: [id number] Date of Birth: July 02, 1946 Admit Type: Outpatient Age: 77 Room: [facility] ENDO ROOM 4 Gender: Female Note Status: Finalized Instrument Name: Prentice Docker [id number] Procedure:             Colonoscopy Indications:           High risk colon cancer surveillance: Personal history                         of colonic polyps Providers:             Midge Minium MD, MD Referring MD:          Marzella Schlein. Bacigalupo (Referring MD) Medicines:             Propofol per Anesthesia Complications:         No immediate complications. Procedure:             Pre-Anesthesia Assessment:                        - Prior to the procedure, a History and Physical was                         performed, and patient medications and allergies were                         reviewed. The patient's tolerance of previous                         anesthesia was also reviewed. The risks and benefits                         of the procedure and the sedation options and risks                         were discussed with the patient. All questions were                         answered, and informed consent was obtained. Prior                         Anticoagulants: The patient has taken no anticoagulant                         or antiplatelet agents. ASA Grade Assessment: II - A                         patient with mild systemic disease. After reviewing                         the risks and benefits, the patient was deemed in                         satisfactory condition to undergo the procedure.                        After obtaining informed consent, the colonoscope was  passed under direct vision. Throughout the procedure,                         the patient's blood pressure, pulse, and oxygen                         saturations were monitored continuously. The                          Colonoscope was introduced through the anus and                         advanced to the the cecum, identified by appendiceal                         orifice and ileocecal valve. The colonoscopy was                         performed without difficulty. The patient tolerated                         the procedure well. The quality of the bowel                         preparation was excellent. Findings:      The perianal and digital rectal examinations were normal.      The colon (entire examined portion) appeared normal. Impression:            - The entire examined colon is normal.                        - No specimens collected. Recommendation:        - Discharge patient to home.                        - Resume previous diet.                        - Continue present medications.                        - Repeat colonoscopy is not recommended for                         surveillance. Procedure Code(s):     --- Professional ---                        [phone], Colonoscopy, flexible; diagnostic, including                         collection of specimen(s) by brushing or washing, when                         performed (separate procedure) Diagnosis Code(s):     --- Professional ---                        Z86.010, Personal history of colonic polyps CPT copyright 2022 American Medical Association. All rights reserved. The codes documented in this report are preliminary and upon coder review  may  be revised to meet current compliance requirements. Midge Minium MD, MD 03/11/2023 8:46:04 AM This report has been signed electronically. Number of Addenda: 0 Note Initiated On: 03/11/2023 8:26 AM Scope Withdrawal Time: 0 hours 8 minutes 18 seconds  Total Procedure Duration: 0 hours 13 minutes 9 seconds  Estimated Blood Loss:  Estimated blood loss: none.      [facility]

## 2023-03-11 NOTE — Transfer of Care (Signed)
Immediate Anesthesia Transfer of Care Note  Patient: Jodi Day  Procedure(s) Performed: COLONOSCOPY WITH PROPOFOL  Patient Location: Endoscopy Unit  Anesthesia Type:General  Level of Consciousness: drowsy and patient cooperative  Airway & Oxygen Therapy: Patient Spontanous Breathing and Patient connected to face mask oxygen  Post-op Assessment: Report given to RN and Post -op Vital signs reviewed and stable  Post vital signs: Reviewed and stable  Last Vitals:  Vitals Value Taken Time  BP 102/66 03/11/23 0856  Temp 36.3 C 03/11/23 0845  Pulse 71 03/11/23 0859  Resp 19 03/11/23 0859  SpO2 100 % 03/11/23 0859  Vitals shown include unvalidated device data.  Last Pain:  Vitals:   03/11/23 0855  TempSrc:   PainSc: 0-No pain         Complications: No notable events documented.

## 2023-03-11 NOTE — Anesthesia Postprocedure Evaluation (Signed)
Anesthesia Post Note  Patient: Emmaleigh J Walther  Procedure(s) Performed: COLONOSCOPY WITH PROPOFOL  Patient location during evaluation: Endoscopy Anesthesia Type: General Level of consciousness: awake and alert Pain management: pain level controlled Vital Signs Assessment: post-procedure vital signs reviewed and stable Respiratory status: spontaneous breathing, nonlabored ventilation, respiratory function stable and patient connected to nasal cannula oxygen Cardiovascular status: blood pressure returned to baseline and stable Postop Assessment: no apparent nausea or vomiting Anesthetic complications: no   No notable events documented.   Last Vitals:  Vitals:   03/11/23 0855 03/11/23 0905  BP: 102/66 (!) 108/59  Pulse:    Resp:    Temp:    SpO2: 98% 100%    Last Pain:  Vitals:   03/11/23 0905  TempSrc:   PainSc: 0-No pain                 Cleda Mccreedy Orphe[facility] [place]

## 2023-03-11 NOTE — Anesthesia Preprocedure Evaluation (Signed)
Anesthesia Evaluation  Patient identified by MRN, date of birth, ID band Patient awake    Reviewed: Allergy & Precautions, NPO status , Patient's Chart, lab work & pertinent test results  Airway Mallampati: III  TM Distance: >3 FB Neck ROM: full    Dental  (+) Chipped, Caps   Pulmonary neg pulmonary ROS, neg shortness of breath, former smoker   Pulmonary exam normal        Cardiovascular Exercise Tolerance: Good (-) angina Normal cardiovascular exam     Neuro/Psych   Anxiety     negative neurological ROS     GI/Hepatic negative GI ROS, Neg liver ROS,,,  Endo/Other  diabetes, Type 2Hypothyroidism    Renal/GU negative Renal ROS  negative genitourinary   Musculoskeletal   Abdominal   Peds  Hematology negative hematology ROS (+)   Anesthesia Other Findings Past Medical History: No date: Anxiety No date: Diabetes mellitus without complication 07/04/2007: Goiter, non-toxic No date: Hyperlipidemia No date: Hypothyroidism No date: Thyroid disease  Past Surgical History: 1992: ABDOMINAL HYSTERECTOMY     Comment:  fibroids. ovaries intact 1996: CHOLECYSTECTOMY 2014: EYE SURGERY; Bilateral     Comment:  cataract surgery 11/02/2018: KNEE ARTHROPLASTY; Right     Comment:  Procedure: COMPUTER ASSISTED TOTAL KNEE ARTHROPLASTY;                Surgeon: Donato Heinz, MD;  Location: [facility] ORS;                Service: Orthopedics;  Laterality: Right; 1973: thyroidectomy 1973: tonsillectomy and adenoidectomy  BMI    Body Mass Index: 23.78 kg/m      Reproductive/Obstetrics negative OB ROS                             Anesthesia Physical Anesthesia Plan  ASA: 3  Anesthesia Plan: General   Post-op Pain Management:    Induction: Intravenous  PONV Risk Score and Plan: Propofol infusion and TIVA  Airway Management Planned: Natural Airway and Nasal Cannula  Additional Equipment:    Intra-op Plan:   Post-operative Plan:   Informed Consent: I have reviewed the patients History and Physical, chart, labs and discussed the procedure including the risks, benefits and alternatives for the proposed anesthesia with the patient or authorized representative who has indicated his/her understanding and acceptance.     Dental Advisory Given  Plan Discussed with: Anesthesiologist, CRNA and Surgeon  Anesthesia Plan Comments: (Patient consented for risks of anesthesia including but not limited to:  - adverse reactions to medications - risk of airway placement if required - damage to eyes, teeth, lips or other oral mucosa - nerve damage due to positioning  - sore throat or hoarseness - Damage to heart, brain, nerves, lungs, other parts of body or loss of life  Patient voiced understanding.)       Anesthesia Quick Evaluation

## 2023-03-11 NOTE — Anesthesia Procedure Notes (Signed)
Procedure Name: General with mask airway Date/Time: 03/11/2023 8:30 AM  Performed by: Mohammed Kindle, CRNAPre-anesthesia Checklist: Patient identified, Emergency Drugs available, Suction available and Patient being monitored Patient Re-evaluated:Patient Re-evaluated prior to induction Oxygen Delivery Method: Simple face mask Induction Type: IV induction Placement Confirmation: positive ETCO2, breath sounds checked- equal and bilateral and CO2 detector Dental Injury: Teeth and Oropharynx as per pre-operative assessment

## 2023-03-11 NOTE — H&P (Signed)
Midge Minium, MD [facility] [address], [address] [place], [place] [address] Phone:[phone] Fax : [phone]  Primary Care Physician:  Erasmo Downer, MD Primary Gastroenterologist:  Dr. Servando Snare  Pre-Procedure History & Physical: HPI:  Jodi Day is a 77 y.o. female is here for an colonoscopy.   Past Medical History:  Diagnosis Date   Anxiety    Diabetes mellitus without complication    Goiter, non-toxic 07/04/2007   Hyperlipidemia    Hypothyroidism    Thyroid disease     Past Surgical History:  Procedure Laterality Date   ABDOMINAL HYSTERECTOMY  1992   fibroids. ovaries intact   CHOLECYSTECTOMY  1996   EYE SURGERY Bilateral 2014   cataract surgery   KNEE ARTHROPLASTY Right 11/02/2018   Procedure: COMPUTER ASSISTED TOTAL KNEE ARTHROPLASTY;  Surgeon: Donato Heinz, MD;  Location: [facility] ORS;  Service: Orthopedics;  Laterality: Right;   thyroidectomy  1973   tonsillectomy and adenoidectomy  1973    Prior to Admission medications   Medication Sig Start Date End Date Taking? Authorizing Provider  Acetylcysteine (NAC PO) Take 1.2 g by mouth.    [provider]  b complex vitamins tablet Take 1 tablet by mouth daily. 20mg     [provider]  Chromium (CHROMEMATE PO) Take 600 mcg by mouth.    [provider]  CINNAMON PO Take 1,200 mg by mouth.    [provider]  co-enzyme Q-10 30 MG capsule Take 100 mg by mouth daily.    [provider]  DHEA 10 MG TABS Take 10 mg by mouth daily.    [provider]  metFORMIN (GLUCOPHAGE-XR) 500 MG 24 hr tablet Take 1 tablet (500 mg total) by mouth 2 (two) times daily. 01/25/23   Bacigalupo, Marzella Schlein, MD  NON FORMULARY Take 1 capsule by mouth daily at 6 (six) AM. "Eye Sight"    [provider]  OVER THE COUNTER MEDICATION Take 12.5 mg by mouth daily. Iodine Complex    [provider]  OVER THE COUNTER MEDICATION AvinoCort    [provider]  OVER THE  COUNTER MEDICATION GYMNEMA 400MG /300MG     [provider]  OVER THE COUNTER MEDICATION KoncentratedK (vit K1 5066mcg/Vit K2 25mg /Vit K2 25mg /Vit K2.5mg /Astaxanthin 2mg )    [provider]  OVER THE COUNTER MEDICATION 500 mg. Phosphatidly Serine    [provider]  PRESCRIPTION MEDICATION Place 1 application rectally every 4 (four) months. Hormone replacement therapy pellet inserted rectally every 4 months. Dosing depends on blood work    [provider]  SELENIUM PO Take 200 mcg by mouth.    [provider]  SYNTHROID 125 MCG tablet Take 125 mcg by mouth daily before breakfast.  07/07/16   [provider]  VITAMIN D, CHOLECALCIFEROL, PO Take 10,000 Units by mouth daily.  01/04/09   [provider]    Allergies as of 02/18/2023 - Review Complete 01/25/2023  Allergen Reaction Noted   Penicillins Other (See Comments) 09/30/2012    Family History  Problem Relation Age of Onset   Diabetes Mother    Hypothyroidism Mother    Stroke Mother    COPD Father    Prostate cancer Brother    Pneumonia Brother        spesis from pneumonia   Prostate cancer Brother    Colon polyps Brother    Parkinson's disease Brother    Cancer Brother        lung cancer  Social History   Socioeconomic History   Marital status: Married    Spouse name: Thereasa Distance   Number of children: 2   Years of education: Associates   Highest education level: Associate degree: occupational, Scientist, product/process development, or vocational program  Occupational History   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    Comment: retired  Tobacco Use   Smoking status: Former    Packs/day: 1.00    Years: 7.00    Additional pack years: 0.00    Total pack years: 7.00    Types: Cigarettes    Quit date: 11/15/1977    Years since quitting: 45.3   Smokeless tobacco: Never  Vaping Use   Vaping Use: Never used  Substance and Sexual Activity   Alcohol use: Yes    Alcohol/week:  1.0 standard drink of alcohol    Types: 1 Glasses of wine per week   Drug use: No   Sexual activity: Yes  Other Topics Concern   Not on file  Social History Narrative   Not on file   Social Determinants of Health   Financial Resource Strain: Low Risk  (01/19/2023)   Overall Financial Resource Strain (CARDIA)    Difficulty of Paying Living Expenses: Not hard at all  Food Insecurity: No Food Insecurity (01/19/2023)   Hunger Vital Sign    Worried About Running Out of Food in the Last Year: Never true    Ran Out of Food in the Last Year: Never true  Transportation Needs: No Transportation Needs (01/19/2023)   PRAPARE - Administrator, Civil Service (Medical): No    Lack of Transportation (Non-Medical): No  Physical Activity: Sufficiently Active (01/19/2023)   Exercise Vital Sign    Days of Exercise per Week: 4 days    Minutes of Exercise per Session: 50 min  Stress: No Stress Concern Present (01/19/2023)   [employer] of Occupational Health - Occupational Stress Questionnaire    Feeling of Stress : Not at all  Social Connections: Moderately Integrated (01/19/2023)   Social Connection and Isolation Panel [NHANES]    Frequency of Communication with Friends and Family: More than three times a week    Frequency of Social Gatherings with Friends and Family: Once a week    Attends Religious Services: 1 to 4 times per year    Active Member of [employer] or Organizations: No    Attends Banker Meetings: Never    Marital Status: Married  Catering manager Violence: Not At Risk (01/19/2023)   Humiliation, Afraid, Rape, and Kick questionnaire    Fear of Current or Ex-Partner: No    Emotionally Abused: No    Physically Abused: No    Sexually Abused: No    Review of Systems: See HPI, otherwise negative ROS  Physical Exam: Ht  (1.753 m)   Wt 73 kg   BMI 23.78 kg/m  General:   Alert,  pleasant and cooperative in NAD Head:  Normocephalic and atraumatic. Neck:   Supple; no masses or thyromegaly. Lungs:  Clear throughout to auscultation.    Heart:  Regular rate and rhythm. Abdomen:  Soft, nontender and nondistended. Normal bowel sounds, without guarding, and without rebound.   Neurologic:  Alert and  oriented x4;  grossly normal neurologically.  Impression/Plan: YANELIE ABRAHA is here for an colonoscopy to be performed for a history of adenomatous polyps on 2019   Risks, benefits, limitations, and alternatives regarding  colonoscopy have been reviewed with the patient.  Questions  have been answered.  All parties agreeable.   Midge Minium, MD  03/11/2023, 8:22 AM

## 2023-03-12 ENCOUNTER — Encounter: Payer: Self-pay | Admitting: Gastroenterology

## 2023-03-22 DIAGNOSIS — N951 Menopausal and female climacteric states: Secondary | ICD-10-CM | POA: Diagnosis not present

## 2023-03-23 ENCOUNTER — Encounter: Payer: Self-pay | Admitting: Gastroenterology

## 2023-03-25 DIAGNOSIS — N951 Menopausal and female climacteric states: Secondary | ICD-10-CM | POA: Diagnosis not present

## 2023-03-25 DIAGNOSIS — Z72820 Sleep deprivation: Secondary | ICD-10-CM | POA: Diagnosis not present

## 2023-03-25 DIAGNOSIS — Z6823 Body mass index (BMI) 23.0-23.9, adult: Secondary | ICD-10-CM | POA: Diagnosis not present

## 2023-03-25 DIAGNOSIS — F419 Anxiety disorder, unspecified: Secondary | ICD-10-CM | POA: Diagnosis not present

## 2023-04-08 ENCOUNTER — Ambulatory Visit: Payer: Self-pay

## 2023-04-08 NOTE — Telephone Encounter (Signed)
  Chief Complaint: medication problem Symptoms: leg cramps, stopped rosuvastatin 2-3 months ago  Frequency: 2 months Pertinent Negatives: NA Disposition: [] ED /[] Urgent Care (no appt availability in office) / [] Appointment(In office/virtual)/ []  [facility]/ [] Home Care/ [] Refused Recommended Disposition /[] [facility]/ [x]  Follow-up with PCP Additional Notes: pt states she was on rosuvastatin for cholesterol. Started having leg cramps so stopped the medication and since pt has done well but wants to try atorvastatin d/t good risk vs bad risk of taking statin. Advised pt that statin in general can cause muscle pain and if Dr. B does send in and she experiences again to call back and schedule appt so we can FU. Pt verbalized understanding and wants medication sent to [facility].   Summary: med ?   Pt called in says , crestor she takes gives her muscle cramps so she is asking can she try lipitor instead. She says its not an emergency she just wants to know if she can try lipitor instead.         Reason for Disposition  [1] Caller has NON-URGENT medicine question about med that PCP prescribed AND [2] triager unable to answer question  Answer Assessment - Initial Assessment Questions 1. NAME of MEDICINE: "What medicine(s) are you calling about?"     Rosuvastatin  2. QUESTION: "What is your question?" (e.g., double dose of medicine, side effect)     Wanting to switch to atorvastatin if possible 3. PRESCRIBER: "Who prescribed the medicine?" Reason: if prescribed by specialist, call should be referred to that group.     Dr. Leonard Schwartz 4. SYMPTOMS: "Do you have any symptoms?" If Yes, ask: "What symptoms are you having?"  "How bad are the symptoms (e.g., mild, moderate, severe)     Leg cramps, stopped about 2-3 months ago  Protocols used: Medication Question Call-A-AH

## 2023-04-09 MED ORDER — ATORVASTATIN CALCIUM 20 MG PO TABS: 20.0000 mg | ORAL_TABLET | Freq: Every day | ORAL | 3 refills | Status: DC

## 2023-04-09 NOTE — Telephone Encounter (Signed)
Yes. Can try lipitor. Ok to send in atorvastatin 20mg  daily #30 r3.  Recommend taking CoQ10 100-200 mg daily to minimize risk of myalgias from statin also.

## 2023-04-09 NOTE — Addendum Note (Signed)
Addended by: Hyacinth Meeker on: 04/09/2023 02:36 PM   Modules accepted: Orders

## 2023-04-09 NOTE — Telephone Encounter (Signed)
Patient advised. Rx sent to [facility].

## 2023-06-16 DIAGNOSIS — N951 Menopausal and female climacteric states: Secondary | ICD-10-CM | POA: Diagnosis not present

## 2023-06-21 DIAGNOSIS — N958 Other specified menopausal and perimenopausal disorders: Secondary | ICD-10-CM | POA: Diagnosis not present

## 2023-06-21 DIAGNOSIS — Z6824 Body mass index (BMI) 24.0-24.9, adult: Secondary | ICD-10-CM | POA: Diagnosis not present

## 2023-06-21 DIAGNOSIS — L659 Nonscarring hair loss, unspecified: Secondary | ICD-10-CM | POA: Diagnosis not present

## 2023-06-21 DIAGNOSIS — F419 Anxiety disorder, unspecified: Secondary | ICD-10-CM | POA: Diagnosis not present

## 2023-07-20 ENCOUNTER — Other Ambulatory Visit: Payer: Self-pay | Admitting: Family Medicine

## 2023-08-25 ENCOUNTER — Ambulatory Visit (INDEPENDENT_AMBULATORY_CARE_PROVIDER_SITE_OTHER): Payer: Medicare Other

## 2023-08-25 DIAGNOSIS — Z23 Encounter for immunization: Secondary | ICD-10-CM | POA: Diagnosis not present

## 2023-08-31 DIAGNOSIS — D2261 Melanocytic nevi of right upper limb, including shoulder: Secondary | ICD-10-CM | POA: Diagnosis not present

## 2023-08-31 DIAGNOSIS — L570 Actinic keratosis: Secondary | ICD-10-CM | POA: Diagnosis not present

## 2023-08-31 DIAGNOSIS — D2262 Melanocytic nevi of left upper limb, including shoulder: Secondary | ICD-10-CM | POA: Diagnosis not present

## 2023-08-31 DIAGNOSIS — X32XXXA Exposure to sunlight, initial encounter: Secondary | ICD-10-CM | POA: Diagnosis not present

## 2023-09-20 DIAGNOSIS — N958 Other specified menopausal and perimenopausal disorders: Secondary | ICD-10-CM | POA: Diagnosis not present

## 2023-09-22 DIAGNOSIS — L659 Nonscarring hair loss, unspecified: Secondary | ICD-10-CM | POA: Diagnosis not present

## 2023-09-22 DIAGNOSIS — E038 Other specified hypothyroidism: Secondary | ICD-10-CM | POA: Diagnosis not present

## 2023-09-22 DIAGNOSIS — F419 Anxiety disorder, unspecified: Secondary | ICD-10-CM | POA: Diagnosis not present

## 2023-09-22 DIAGNOSIS — N958 Other specified menopausal and perimenopausal disorders: Secondary | ICD-10-CM | POA: Diagnosis not present

## 2023-10-04 DIAGNOSIS — H524 Presbyopia: Secondary | ICD-10-CM | POA: Diagnosis not present

## 2023-10-04 DIAGNOSIS — H00021 Hordeolum internum right upper eyelid: Secondary | ICD-10-CM | POA: Diagnosis not present

## 2023-10-04 DIAGNOSIS — Z961 Presence of intraocular lens: Secondary | ICD-10-CM | POA: Diagnosis not present

## 2023-10-04 DIAGNOSIS — H43812 Vitreous degeneration, left eye: Secondary | ICD-10-CM | POA: Diagnosis not present

## 2023-10-21 DIAGNOSIS — H0011 Chalazion right upper eyelid: Secondary | ICD-10-CM | POA: Diagnosis not present

## 2023-10-21 DIAGNOSIS — Z961 Presence of intraocular lens: Secondary | ICD-10-CM | POA: Diagnosis not present

## 2023-10-21 DIAGNOSIS — H0289 Other specified disorders of eyelid: Secondary | ICD-10-CM | POA: Diagnosis not present

## 2023-12-30 LAB — HM MAMMOGRAPHY

## 2024-01-27 ENCOUNTER — Ambulatory Visit (INDEPENDENT_AMBULATORY_CARE_PROVIDER_SITE_OTHER): Payer: Medicare Other | Admitting: Family Medicine

## 2024-01-27 VITALS — BP 135/75 | HR 62 | Ht 69.0 in | Wt 182.8 lb

## 2024-01-27 DIAGNOSIS — G720 Drug-induced myopathy: Secondary | ICD-10-CM

## 2024-01-27 DIAGNOSIS — E781 Pure hyperglyceridemia: Secondary | ICD-10-CM

## 2024-01-27 DIAGNOSIS — Z0001 Encounter for general adult medical examination with abnormal findings: Secondary | ICD-10-CM | POA: Diagnosis not present

## 2024-01-27 DIAGNOSIS — E559 Vitamin D deficiency, unspecified: Secondary | ICD-10-CM | POA: Diagnosis not present

## 2024-01-27 DIAGNOSIS — N816 Rectocele: Secondary | ICD-10-CM | POA: Diagnosis not present

## 2024-01-27 DIAGNOSIS — R635 Abnormal weight gain: Secondary | ICD-10-CM | POA: Diagnosis not present

## 2024-01-27 DIAGNOSIS — R739 Hyperglycemia, unspecified: Secondary | ICD-10-CM

## 2024-01-27 DIAGNOSIS — E039 Hypothyroidism, unspecified: Secondary | ICD-10-CM | POA: Diagnosis not present

## 2024-01-27 DIAGNOSIS — Z0000 Encounter for general adult medical examination without abnormal findings: Secondary | ICD-10-CM

## 2024-01-27 LAB — POCT URINALYSIS DIPSTICK
Bilirubin, UA: NEGATIVE
Blood, UA: NEGATIVE
Glucose, UA: NEGATIVE
Ketones, UA: NEGATIVE
Leukocytes, UA: NEGATIVE
Nitrite, UA: NEGATIVE
Protein, UA: NEGATIVE
Spec Grav, UA: 1.01 (ref 1.010–1.025)
Urobilinogen, UA: 0.2 U/dL
pH, UA: 5 (ref 5.0–8.0)

## 2024-01-27 NOTE — Progress Notes (Signed)
 Complete physical exam   Patient: Jodi Day   DOB: 01/05/1946   78 y.o. Female  MRN: [id number] Visit Date: 01/27/2024  Today's healthcare provider: Shirlee Latch, MD   Chief Complaint  Patient presents with   Annual Exam    Last completed 01/25/23 Diet -  General, healthy  Exercise - walk 3- 5 times a week at least a mile, [employer] doing water aerobics three times a week for one hour Feeling - well Sleeping - well but when she wakes up she has an anxious feeling  Concerns -  weight gain and would like to inquire about wegovy, bilateral ringing in ears,  Swelling in right pinky finger that will come and go, associated with pain to the touch and unable to move finger when it does swell   Care Management    Mammogram completed in February 2025. Records requested. [facility]    Subjective    Jodi Day is a 78 y.o. female who presents today for a complete physical exam.   Discussed the use of AI scribe software for clinical note transcription with the patient, who gave verbal consent to proceed.  History of Present Illness   The patient, with a history of partial hysterectomy and cholesterol management, presents with multiple concerns. The primary concern is leg cramps that occur several months after starting statin therapy. The patient has tried both Crestor and Lipitor, with both resulting in severe leg cramps after a few months of use. The patient is currently not on any statin medication.  The patient also reports occasional feelings of anxiety upon waking up, but is able to calm herself and return to sleep. She notes that she has been more active and busy recently, which may contribute to these feelings.  The patient mentions a recent weight gain and is currently taking metformin once a day for blood sugar management. She inquires about the possibility of increasing her metformin dosage or starting Wegovy or Ozempic, but does not meet the criteria for these  medications.  Lastly, the patient reports feeling a bulge in her vaginal area, which she describes as the size of a golf ball or lemon. This bulge does not cause any pain or discomfort, and does not affect her urination. The patient had a partial hysterectomy in the past, with her uterus removed and ovaries left intact.        Last depression screening scores    01/27/2024    1:26 PM 01/25/2023    2:01 PM 01/19/2023    1:46 PM  PHQ 2/9 Scores  PHQ - 2 Score 1 1 2   PHQ- 9 Score 5 1 2    Last fall risk screening    01/27/2024    1:26 PM  Fall Risk   Falls in the past year? 0  Number falls in past yr: 0  Injury with Fall? 0  Risk for fall due to : No Fall Risks  Follow up Falls evaluation completed        Medications: Outpatient Medications Prior to Visit  Medication Sig   Acetylcysteine (NAC PO) Take 1.2 g by mouth.   b complex vitamins tablet Take 1 tablet by mouth daily. 20mg    Chromium (CHROMEMATE PO) Take 600 mcg by mouth.   CINNAMON PO Take 1,200 mg by mouth.   co-enzyme Q-10 30 MG capsule Take 100 mg by mouth daily.   DHEA 10 MG TABS Take 10 mg by mouth daily.   metFORMIN (GLUCOPHAGE-XR) 500  MG 24 hr tablet Take 1 tablet (500 mg total) by mouth 2 (two) times daily.   NON FORMULARY Take 1 capsule by mouth daily at 6 (six) AM. "Eye Sight"   OVER THE COUNTER MEDICATION Take 12.5 mg by mouth daily. Iodine Complex   OVER THE COUNTER MEDICATION AvinoCort   OVER THE COUNTER MEDICATION GYMNEMA 400MG /300MG    OVER THE COUNTER MEDICATION KoncentratedK (vit K1 5076mcg/Vit K2 25mg /Vit K2 25mg /Vit K2.5mg /Astaxanthin 2mg )   PRESCRIPTION MEDICATION Place 1 application rectally every 4 (four) months. Hormone replacement therapy pellet inserted rectally every 4 months. Dosing depends on blood work   SELENIUM PO Take 200 mcg by mouth.   SYNTHROID 125 MCG tablet Take 125 mcg by mouth daily before breakfast.    VITAMIN D, CHOLECALCIFEROL, PO Take 10,000 Units by mouth daily.     atorvastatin (LIPITOR) 20 MG tablet TAKE ONE TABLET BY MOUTH EVERY DAY   OVER THE COUNTER MEDICATION 500 mg. Phosphatidly Serine   No facility-administered medications prior to visit.    Review of Systems    Objective    BP 135/75   Pulse 62   Ht 5\' 9"  (1.753 m)   Wt 182 lb 12.8 oz (82.9 kg)   SpO2 100%   BMI 26.99 kg/m    Physical Exam Vitals reviewed.  Constitutional:      General: She is not in acute distress.    Appearance: Normal appearance. She is well-developed. She is not diaphoretic.  HENT:     Head: Normocephalic and atraumatic.     Right Ear: Tympanic membrane, ear canal and external ear normal.     Left Ear: Tympanic membrane, ear canal and external ear normal.     Nose: Nose normal.     Mouth/Throat:     Mouth: Mucous membranes are moist.     Pharynx: Oropharynx is clear. No oropharyngeal exudate.  Eyes:     General: No scleral icterus.    Conjunctiva/sclera: Conjunctivae normal.     Pupils: Pupils are equal, round, and reactive to light.  Neck:     Thyroid: No thyromegaly.  Cardiovascular:     Rate and Rhythm: Normal rate and regular rhythm.     Heart sounds: Normal heart sounds. No murmur heard. Pulmonary:     Effort: Pulmonary effort is normal. No respiratory distress.     Breath sounds: Normal breath sounds. No wheezing or rales.  Abdominal:     General: There is no distension.     Palpations: Abdomen is soft.     Tenderness: There is no abdominal tenderness.  Genitourinary:    Comments: GYN:  External genitalia within normal limits.  Vaginal mucosa pink, moist, normal rugae.  +Rectocele  Musculoskeletal:        General: No deformity.     Cervical back: Neck supple.     Right lower leg: No edema.     Left lower leg: No edema.  Lymphadenopathy:     Cervical: No cervical adenopathy.  Skin:    General: Skin is warm and dry.     Findings: No rash.  Neurological:     Mental Status: She is alert and oriented to person, place, and time. Mental  status is at baseline.     Gait: Gait normal.  Psychiatric:        Mood and Affect: Mood normal.        Behavior: Behavior normal.        Thought Content: Thought content normal.  No results found for any visits on 01/27/24.  Assessment & Plan    Routine Health Maintenance and Physical Exam  Exercise Activities and Dietary recommendations  Goals      DIET - EAT MORE FRUITS AND VEGETABLES     DIET - REDUCE CALORIE INTAKE     Recommend to cut back on carbohydrates in daily diet (avoid breads).         Immunization History  Administered Date(s) Administered   Fluad Quad(high Dose 65+) 07/25/2019, 08/06/2021, 08/19/2022   Fluad Trivalent(High Dose 65+) 08/25/2023   Influenza, High Dose Seasonal PF 07/30/2020   Influenza-Unspecified 08/15/2018   PFIZER(Purple Top)SARS-COV-2 Vaccination 12/07/2019, 12/25/2019, 08/17/2020   Pneumococcal Conjugate-13 01/08/2015   Pneumococcal Polysaccharide-23 01/15/2016   Td 10/20/2017   Tdap 07/01/2007   Zoster Recombinant(Shingrix) 12/27/2017, 02/28/2018   Zoster, Live 01/04/2009    Health Maintenance  Topic Date Due   COVID-19 Vaccine (4 - 2024-25 season) 07/18/2023   MAMMOGRAM  12/30/2023   Medicare Annual Wellness (AWV)  01/19/2024   DTaP/Tdap/Td (3 - Td or Tdap) 10/21/2027   DEXA SCAN  12/25/2027   Colonoscopy  03/10/2028   Pneumonia Vaccine 28+ Years old  Completed   INFLUENZA VACCINE  Completed   Hepatitis C Screening  Completed   Zoster Vaccines- Shingrix  Completed   HPV VACCINES  Aged Out    Discussed health benefits of physical activity, and encouraged her to engage in regular exercise appropriate for her age and condition.  Problem List Items Addressed This Visit       Endocrine   Adult hypothyroidism   Relevant Orders   TSH     Musculoskeletal and Integument   Statin myopathy     Other   Blood glucose elevated   Relevant Orders   Hemoglobin A1c   Hypertriglyceridemia   Relevant Orders    Comprehensive metabolic panel   Lipid panel   CT MODIFIED CAL SCORE   Avitaminosis D   Relevant Orders   VITAMIN D 25 Hydroxy (Vit-D Deficiency, Fractures)   Other Visit Diagnoses       Encounter for annual physical exam    -  Primary   Relevant Orders   Hemoglobin A1c   Comprehensive metabolic panel   Lipid panel   VITAMIN D 25 Hydroxy (Vit-D Deficiency, Fractures)   TSH   POCT Urinalysis Dipstick     Rectocele               Statin-induced myopathy She has hyperlipidemia and experienced severe leg cramps with both Crestor and Lipitor, likely due to CoQ10 depletion, a known statin side effect. She inconsistently took CoQ10 supplements. Aware of statins' benefits for heart attack and stroke prevention, especially given her age of 25 and family history of stroke, she is willing to restart Lipitor with consistent CoQ10 supplementation. - Increase CoQ10 supplementation to daily use. - Restart Lipitor in a couple of months after consistently taking CoQ10 daily. - Monitor for recurrence of leg cramps after restarting Lipitor.  Coronary artery disease risk assessment She inquired about a coronary calcium score test, a CT scan assessing heart attack and stroke risk, available in [place]. Aware of the $150 out-of-pocket cost, she considers it reasonable for heart risk assessment, given her family history of stroke and her age of 15. - Arrange coronary calcium score test in [place].  Weight gain She reports weight gain and is currently on metformin once daily. She considered increasing the dose but did not due to stable blood sugar  levels. She inquired about Wegovy or Ozempic for weight management but does not meet the criteria for either. Blood sugar levels will be checked during the visit. - Check blood sugar levels. - Continue metformin 500 mg once daily.  Rectocele She reports a small bulge near her rectum, likely a rectocele, where the rectum bulges into the vaginal cavity  due to weakened pelvic floor muscles. Common with aging and childbirth, it is not concerning unless painful or causing bowel incontinence. The bulge is covered by normal vaginal tissue. - Perform pelvic exam to confirm rectocele. - Encourage pelvic floor exercises such as Kegels. - Advise her to remain active to strengthen pelvic floor muscles.  Anxiety She reports feeling scared or worried upon waking but can calm herself and return to sleep. It is normal given her recent experiences, and she manages these feelings well.  General Health Maintenance She is 78 years old. Her last mammogram in February was normal. It is important to continue annual mammograms. She had a partial hysterectomy with uterus removal but retained ovaries. She will age out of colonoscopy need after age 45. - Obtain mammogram report from [facility]. - Continue annual mammogram screenings.  Follow-up She is advised to follow up for her next annual physical. Lab test results will be communicated through the patient portal. - Schedule next year's physical on the way out. - Communicate lab results through patient portal.       Return in about 1 year (around 01/26/2025) for CPE.     Shirlee Latch, MD  [facility] [phone] (phone) [phone] (fax)  [facility]

## 2024-01-28 LAB — HEMOGLOBIN A1C
Est. average glucose Bld gHb Est-mCnc: 108 mg/dL
Hgb A1c MFr Bld: 5.4 % (ref 4.8–5.6)

## 2024-01-28 LAB — COMPREHENSIVE METABOLIC PANEL
ALT: 24 IU/L (ref 0–32)
AST: 24 IU/L (ref 0–40)
Albumin: 4.5 g/dL (ref 3.8–4.8)
Alkaline Phosphatase: 63 IU/L (ref 44–121)
BUN/Creatinine Ratio: 16 (ref 12–28)
BUN: 11 mg/dL (ref 8–27)
Bilirubin Total: 0.5 mg/dL (ref 0.0–1.2)
CO2: 22 mmol/L (ref 20–29)
Calcium: 9.1 mg/dL (ref 8.7–10.3)
Chloride: 101 mmol/L (ref 96–106)
Creatinine, Ser: 0.67 mg/dL (ref 0.57–1.00)
Globulin, Total: 2.3 g/dL (ref 1.5–4.5)
Glucose: 82 mg/dL (ref 70–99)
Potassium: 3.9 mmol/L (ref 3.5–5.2)
Sodium: 141 mmol/L (ref 134–144)
Total Protein: 6.8 g/dL (ref 6.0–8.5)
eGFR: 91 mL/min/{1.73_m2} (ref >59)

## 2024-01-28 LAB — LIPID PANEL
Chol/HDL Ratio: 3.1 ratio (ref 0.0–4.4)
Cholesterol, Total: 179 mg/dL (ref 100–199)
HDL: 57 mg/dL (ref >39)
LDL Chol Calc (NIH): 94 mg/dL (ref 0–99)
Triglycerides: 163 mg/dL — ABNORMAL HIGH (ref 0–149)
VLDL Cholesterol Cal: 28 mg/dL (ref 5–40)

## 2024-01-28 LAB — VITAMIN D 25 HYDROXY (VIT D DEFICIENCY, FRACTURES): Vit D, 25-Hydroxy: 49.8 ng/mL (ref 30.0–100.0)

## 2024-01-28 LAB — TSH: TSH: 0.309 u[IU]/mL — ABNORMAL LOW (ref 0.450–4.500)

## 2024-02-08 ENCOUNTER — Encounter: Payer: Self-pay | Admitting: Family Medicine

## 2024-02-08 DIAGNOSIS — E781 Pure hyperglyceridemia: Secondary | ICD-10-CM

## 2024-02-08 NOTE — Telephone Encounter (Signed)
 Please see the pt request below and advise

## 2024-02-09 ENCOUNTER — Ambulatory Visit: Payer: Medicare Other | Admitting: Emergency Medicine

## 2024-02-09 VITALS — Ht 69.0 in | Wt 170.0 lb

## 2024-02-09 DIAGNOSIS — Z0000 Encounter for general adult medical examination without abnormal findings: Secondary | ICD-10-CM | POA: Diagnosis not present

## 2024-02-09 NOTE — Progress Notes (Signed)
 Subjective:   Jodi Day is a 78 y.o. who presents for a Medicare Wellness preventive visit.  Visit Complete: Virtual I connected with  Sherian Maroon on 02/09/24 by a audio enabled telemedicine application and verified that I am speaking with the correct person using two identifiers.  Patient Location: Home  Provider Location: Home Office  I discussed the limitations of evaluation and management by telemedicine. The patient expressed understanding and agreed to proceed.  Vital Signs: Because this visit was a virtual/telehealth visit, some criteria may be missing or patient reported. Any vitals not documented were not able to be obtained and vitals that have been documented are patient reported.  VideoDeclined- This patient declined Librarian, academic. Therefore the visit was completed with audio only.  Persons Participating in Visit: Patient.  AWV Questionnaire: No: Patient Medicare AWV questionnaire was not completed prior to this visit.  Cardiac Risk Factors include: advanced age (>70men, >45 women);dyslipidemia     Objective:    Today's Vitals   02/09/24 1045  Weight: 170 lb (77.1 kg)  Height: 5\' 9"  (1.753 m)   Body mass index is 25.1 kg/m.     02/09/2024   11:03 AM 03/11/2023    8:05 AM 01/19/2023    1:51 PM 01/15/2022    1:19 PM 01/09/2021   11:29 AM 01/08/2020    1:32 PM 11/02/2018    8:59 PM  Advanced Directives  Does Patient Have a Medical Advance Directive? No No No No No No No  Would patient like information on creating a medical advance directive? No - Patient declined   No - Patient declined No - Patient declined No - Patient declined No - Patient declined    Current Medications (verified) Outpatient Encounter Medications as of 02/09/2024  Medication Sig   Acetylcysteine (NAC PO) Take 1.2 g by mouth.   b complex vitamins tablet Take 1 tablet by mouth daily. 20mg    Chromium (CHROMEMATE PO) Take 600 mcg by mouth.   CINNAMON PO  Take 1,200 mg by mouth.   co-enzyme Q-10 30 MG capsule Take 100 mg by mouth daily.   DHEA 10 MG TABS Take 10 mg by mouth daily.   fluorouracil (EFUDEX) 5 % cream Apply 1 Application topically 2 (two) times daily.   metFORMIN (GLUCOPHAGE-XR) 500 MG 24 hr tablet Take 1 tablet (500 mg total) by mouth 2 (two) times daily.   NON FORMULARY Take 1 capsule by mouth daily at 6 (six) AM. "Eye Sight"   OVER THE COUNTER MEDICATION Take 12.5 mg by mouth daily. Iodine Complex   OVER THE COUNTER MEDICATION AvinoCort   OVER THE COUNTER MEDICATION GYMNEMA 400MG /300MG    OVER THE COUNTER MEDICATION KoncentratedK (vit K1 5048mcg/Vit K2 25mg /Vit K2 25mg /Vit K2.5mg /Astaxanthin 2mg )   PRESCRIPTION MEDICATION Place 1 application rectally every 4 (four) months. Hormone replacement therapy pellet inserted rectally every 4 months. Dosing depends on blood work   SELENIUM PO Take 200 mcg by mouth.   SYNTHROID 125 MCG tablet Take 125 mcg by mouth daily before breakfast.    VITAMIN D, CHOLECALCIFEROL, PO Take 10,000 Units by mouth daily.    atorvastatin (LIPITOR) 20 MG tablet TAKE ONE TABLET BY MOUTH EVERY DAY (Patient not taking: Reported on 02/09/2024)   No facility-administered encounter medications on file as of 02/09/2024.    Allergies (verified) Penicillin g and Penicillins   History: Past Medical History:  Diagnosis Date   Anxiety    Diabetes mellitus without complication (HCC)  Goiter, non-toxic 07/04/2007   Hyperlipidemia    Hypothyroidism    Thyroid disease    Past Surgical History:  Procedure Laterality Date   ABDOMINAL HYSTERECTOMY  1992   fibroids. ovaries intact   CHOLECYSTECTOMY  1996   COLONOSCOPY WITH PROPOFOL N/A 03/11/2023   Procedure: COLONOSCOPY WITH PROPOFOL;  Surgeon: Midge Minium, MD;  Location[facility] ENDOSCOPY;  Service: Endoscopy;  Laterality: N/A;   EYE SURGERY Bilateral 2014   cataract surgery   KNEE ARTHROPLASTY Right 11/02/2018   Procedure: COMPUTER ASSISTED TOTAL KNEE  ARTHROPLASTY;  Surgeon: Donato Heinz, MD;  Location: [facility] ORS;  Service: Orthopedics;  Laterality: Right;   thyroidectomy  1973   tonsillectomy and adenoidectomy  1973   Family History  Problem Relation Age of Onset   Diabetes Mother    Hypothyroidism Mother    Stroke Mother    COPD Father    Prostate cancer Brother    Pneumonia Brother        spesis from pneumonia   Prostate cancer Brother    Colon polyps Brother    Parkinson's disease Brother    Cancer Brother        lung cancer   Social History   Socioeconomic History   Marital status: Married    Spouse name: Thereasa Distance   Number of children: 2   Years of education: Associates   Highest education level: Associate degree: occupational, Scientist, product/process development, or vocational program  Occupational History   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    Comment: retired  Tobacco Use   Smoking status: Former    Current packs/day: 0.00    Average packs/day: 1 pack/day for 7.0 years (7.0 ttl pk-yrs)    Types: Cigarettes    Start date: 11/15/1970    Quit date: 11/15/1977    Years since quitting: 46.2   Smokeless tobacco: Never  Vaping Use   Vaping status: Never Used  Substance and Sexual Activity   Alcohol use: Yes    Alcohol/week: 1.0 standard drink of alcohol    Types: 1 Glasses of wine per week   Drug use: No   Sexual activity: Yes  Other Topics Concern   Not on file  Social History Narrative   02/09/24 Patient still working at her real estate business   Social Drivers of Health   Financial Resource Strain: Low Risk  (02/09/2024)   Overall Financial Resource Strain (CARDIA)    Difficulty of Paying Living Expenses: Not hard at all  Food Insecurity: No Food Insecurity (02/09/2024)   Hunger Vital Sign    Worried About Running Out of Food in the Last Year: Never true    Ran Out of Food in the Last Year: Never true  Transportation Needs: No Transportation Needs (02/09/2024)   PRAPARE - Scientist, research (physical sciences) (Medical): No    Lack of Transportation (Non-Medical): No  Physical Activity: Sufficiently Active (02/09/2024)   Exercise Vital Sign    Days of Exercise per Week: 5 days    Minutes of Exercise per Session: 60 min  Stress: No Stress Concern Present (02/09/2024)   [employer] of Occupational Health - Occupational Stress Questionnaire    Feeling of Stress : Not at all  Recent Concern: Stress - Stress Concern Present (01/27/2024)   [employer] of Occupational Health - Occupational Stress Questionnaire    Feeling of Stress : To some extent  Social Connections: Moderately Integrated (02/09/2024)   Social Connection and Isolation Panel [NHANES]  Frequency of Communication with Friends and Family: More than three times a week    Frequency of Social Gatherings with Friends and Family: More than three times a week    Attends Religious Services: More than 4 times per year    Active Member of [employer] or Organizations: No    Attends Engineer, structural: Never    Marital Status: Married    Tobacco Counseling Counseling given: Not Answered    Clinical Intake:  Pre-visit preparation completed: Yes  Pain : No/denies pain     BMI - recorded: 25.1 Nutritional Status: BMI 25 -29 Overweight Nutritional Risks: None Diabetes: No  Lab Results  Component Value Date   HGBA1C 5.4 01/27/2024   HGBA1C 5.4 01/25/2023   HGBA1C 5.6 02/06/2022     How often do you need to have someone help you when you read instructions, pamphlets, or other written materials from your doctor or pharmacy?: 1 - Never  Interpreter Needed?: No  Information entered by :: Tora Kindred, CMA   Activities of Daily Living     02/09/2024   10:47 AM  In your present state of health, do you have any difficulty performing the following activities:  Hearing? 0  Vision? 0  Difficulty concentrating or making decisions? 0  Walking or climbing stairs? 0  Dressing or bathing? 0  Doing  errands, shopping? 0  Preparing Food and eating ? N  Using the Toilet? N  In the past six months, have you accidently leaked urine? N  Do you have problems with loss of bowel control? N  Managing your Medications? N  Managing your Finances? N  Housekeeping or managing your Housekeeping? N    Patient Care Team: Erasmo Downer, MD as PCP - General (Family Medicine) Vernie Murders, MD (Otolaryngology) System, Provider Not In (Ophthalmology) System, Provider Not In (Dermatology)  Indicate any recent Medical Services you may have received from other than [facility] providers in the past year (date may be approximate).     Assessment:   This is a routine wellness examination for Wendolyn.  Hearing/Vision screen Hearing Screening - Comments:: Denies hearing loss Vision Screening - Comments:: Gets routine eye exams, [facility], [facility] [place]   Goals Addressed             This Visit's Progress    Patient Stated       Continue to exercise 3-5 times per week       Depression Screen     02/09/2024   11:00 AM 01/27/2024    1:26 PM 01/25/2023    2:01 PM 01/19/2023    1:46 PM 01/22/2022    3:02 PM 01/15/2022    1:16 PM 01/20/2021    3:11 PM  PHQ 2/9 Scores  PHQ - 2 Score 0 1 1 2  0 0 0  PHQ- 9 Score 0 5 1 2 1   0    Fall Risk     02/09/2024   11:04 AM 01/27/2024    1:26 PM 01/25/2023    2:01 PM 01/19/2023    1:38 PM 01/22/2022    3:02 PM  Fall Risk   Falls in the past year? 0 0 0 0 0  Number falls in past yr: 0 0 0 0 0  Injury with Fall? 0 0 0 0 0  Risk for fall due to : No Fall Risks No Fall Risks No Fall Risks No Fall Risks No Fall Risks  Follow up Falls prevention discussed;Falls evaluation completed Falls evaluation  completed Falls evaluation completed Education provided;Falls prevention discussed Falls evaluation completed    MEDICARE RISK AT HOME:  Medicare Risk at Home Any stairs in or around the home?: Yes If so, are there any without handrails?: No Home free of loose  throw rugs in walkways, pet beds, electrical cords, etc?: Yes Adequate lighting in your home to reduce risk of falls?: Yes Life alert?: No Use of a cane, walker or w/c?: No Grab bars in the bathroom?: Yes Shower chair or bench in shower?: Yes Elevated toilet seat or a handicapped toilet?: No  TIMED UP AND GO:  Was the test performed?  No  Cognitive Function: 6CIT completed        02/09/2024   11:05 AM 01/19/2023    1:54 PM 01/20/2021    2:05 PM 01/08/2020    1:36 PM 09/22/2017   11:20 AM  6CIT Screen  What Year? 0 points 0 points 0 points 0 points 0 points  What month? 0 points 0 points 0 points 0 points 0 points  What time? 0 points 0 points 0 points 0 points 0 points  Count back from 20 0 points 0 points 0 points 0 points 0 points  Months in reverse 0 points 0 points 0 points 2 points 2 points  Repeat phrase 0 points 2 points 0 points 0 points 0 points  Total Score 0 points 2 points 0 points 2 points 2 points    Immunizations Immunization History  Administered Date(s) Administered   Fluad Quad(high Dose 65+) 07/25/2019, 08/06/2021, 08/19/2022   Fluad Trivalent(High Dose 65+) 08/25/2023   Influenza, High Dose Seasonal PF 07/30/2020   Influenza-Unspecified 08/15/2018   PFIZER(Purple Top)SARS-COV-2 Vaccination 12/07/2019, 12/25/2019, 08/17/2020   Pneumococcal Conjugate-13 01/08/2015   Pneumococcal Polysaccharide-23 01/15/2016   Td 10/20/2017   Tdap 07/01/2007   Zoster Recombinant(Shingrix) 12/27/2017, 02/28/2018   Zoster, Live 01/04/2009    Screening Tests Health Maintenance  Topic Date Due   COVID-19 Vaccine (4 - 2024-25 season) 07/18/2023   MAMMOGRAM  12/29/2024   Medicare Annual Wellness (AWV)  02/08/2025   DTaP/Tdap/Td (3 - Td or Tdap) 10/21/2027   DEXA SCAN  12/25/2027   Colonoscopy  03/10/2028   Pneumonia Vaccine 3+ Years old  Completed   INFLUENZA VACCINE  Completed   Hepatitis C Screening  Completed   Zoster Vaccines- Shingrix  Completed   HPV VACCINES   Aged Out    Health Maintenance  Health Maintenance Due  Topic Date Due   COVID-19 Vaccine (4 - 2024-25 season) 07/18/2023   Health Maintenance Items Addressed: See Nurse Notes  Additional Screening:  Vision Screening: Recommended annual ophthalmology exams for early detection of glaucoma and other disorders of the eye.  Dental Screening: Recommended annual dental exams for proper oral hygiene  Community Resource Referral / Chronic Care Management: CRR required this visit?  No   CCM required this visit?  No     Plan:     I have personally reviewed and noted the following in the patient's chart:   Medical and social history Use of alcohol, tobacco or illicit drugs  Current medications and supplements including opioid prescriptions. Patient is not currently taking opioid prescriptions. Functional ability and status Nutritional status Physical activity Advanced directives List of other physicians Hospitalizations, surgeries, and ER visits in previous 12 months Vitals Screenings to include cognitive, depression, and falls Referrals and appointments  In addition, I have reviewed and discussed with patient certain preventive protocols, quality metrics, and best practice recommendations. A written  personalized care plan for preventive services as well as general preventive health recommendations were provided to patient.     Tora Kindred, CMA   02/09/2024   After Visit Summary: (MyChart) Due to this being a telephonic visit, the after visit summary with patients personalized plan was offered to patient via MyChart   Notes: Nothing significant to report at this time.

## 2024-02-09 NOTE — Patient Instructions (Addendum)
 Jodi Day , Thank you for taking time to come for your Medicare Wellness Visit. I appreciate your ongoing commitment to your health goals. Please review the following plan we discussed and let me know if I can assist you in the future.   Referrals/Orders/Follow-Ups/Clinician Recommendations: Discuss the need for a covid booster in the fall with your doctor. Keep up the good work!  This is a list of the screening recommended for you and due dates:  Health Maintenance  Topic Date Due   COVID-19 Vaccine (4 - 2024-25 season) 07/18/2023   Mammogram  12/29/2024   Medicare Annual Wellness Visit  02/08/2025   DTaP/Tdap/Td vaccine (3 - Td or Tdap) 10/21/2027   DEXA scan (bone density measurement)  12/25/2027   Colon Cancer Screening  03/10/2028   Pneumonia Vaccine  Completed   Flu Shot  Completed   Hepatitis C Screening  Completed   Zoster (Shingles) Vaccine  Completed   HPV Vaccine  Aged Out    Advanced directives: (Declined) Advance directive discussed with you today. Even though you declined this today, please call our office should you change your mind, and we can give you the proper paperwork for you to fill out.  Next Medicare Annual Wellness Visit scheduled for next year: Yes, 02/14/25 @ 10:50am (phone visit)

## 2024-02-10 ENCOUNTER — Encounter: Payer: Self-pay | Admitting: Family Medicine

## 2024-02-10 MED ORDER — ROSUVASTATIN CALCIUM 5 MG PO TABS: 5.0000 mg | ORAL_TABLET | Freq: Every day | ORAL | 3 refills | Status: DC

## 2024-02-10 NOTE — Telephone Encounter (Signed)
 Ok to send crestor 5mg  daily #90 r3 [facility]. D/c Atorvastatin. Make sure she is taking OTC CoQ10

## 2024-03-21 ENCOUNTER — Ambulatory Visit
Admission: RE | Admit: 2024-03-21 | Discharge: 2024-03-21 | Disposition: A | Discharge status: Home / Self Care | Payer: Self-pay | Source: Ambulatory Visit | Attending: Family Medicine | Admitting: Family Medicine

## 2024-03-21 DIAGNOSIS — E781 Pure hyperglyceridemia: Secondary | ICD-10-CM | POA: Insufficient documentation

## 2024-03-22 ENCOUNTER — Encounter: Payer: Self-pay | Admitting: Family Medicine

## 2024-08-22 ENCOUNTER — Ambulatory Visit

## 2024-08-24 ENCOUNTER — Ambulatory Visit

## 2024-09-14 ENCOUNTER — Ambulatory Visit (INDEPENDENT_AMBULATORY_CARE_PROVIDER_SITE_OTHER)

## 2024-09-14 DIAGNOSIS — Z23 Encounter for immunization: Secondary | ICD-10-CM | POA: Diagnosis not present

## 2024-10-31 NOTE — Telephone Encounter (Signed)
 Appt scheduled 12/11/24 Date of surgery 12/26/24

## 2024-10-31 NOTE — Telephone Encounter (Unsigned)
 Copied from CRM #[id number]. Topic: Clinical - Request for Lab/Test Order >> Oct 30, 2024  5:17 PM Delon DASEN wrote: Reason for CRM: Patient needs EKG and labs before surgery  [phone]

## 2024-11-17 ENCOUNTER — Other Ambulatory Visit: Payer: Self-pay | Admitting: Family Medicine

## 2024-11-17 DIAGNOSIS — E781 Pure hyperglyceridemia: Secondary | ICD-10-CM

## 2024-12-11 ENCOUNTER — Ambulatory Visit: Admitting: Family Medicine

## 2024-12-14 ENCOUNTER — Encounter: Payer: Self-pay | Admitting: Family Medicine

## 2024-12-14 ENCOUNTER — Ambulatory Visit (INDEPENDENT_AMBULATORY_CARE_PROVIDER_SITE_OTHER): Admitting: Family Medicine

## 2024-12-14 VITALS — BP 123/66 | HR 68 | Resp 14 | Ht 69.0 in | Wt 179.7 lb

## 2024-12-14 DIAGNOSIS — E781 Pure hyperglyceridemia: Secondary | ICD-10-CM

## 2024-12-14 DIAGNOSIS — E039 Hypothyroidism, unspecified: Secondary | ICD-10-CM | POA: Diagnosis not present

## 2024-12-14 DIAGNOSIS — M1712 Unilateral primary osteoarthritis, left knee: Secondary | ICD-10-CM

## 2024-12-14 DIAGNOSIS — Z01818 Encounter for other preprocedural examination: Secondary | ICD-10-CM

## 2024-12-14 DIAGNOSIS — Z1231 Encounter for screening mammogram for malignant neoplasm of breast: Secondary | ICD-10-CM

## 2024-12-14 NOTE — Progress Notes (Signed)
 "     Established patient visit   Patient: Jodi Day   DOB: [birth year]   79 y.o. Female  MRN: [id number] Visit Date: 12/14/2024  Today's healthcare provider: Jon Eva, MD   Chief Complaint  Patient presents with   Medical Clearance   Subjective    HPI   Discussed the use of AI scribe software for clinical note transcription with the patient, who gave verbal consent to proceed.  History of Present Illness   Jodi Day is a 79 year old female who presents for surgical clearance for a total knee replacement.  She is scheduled for total knee replacement on February 10th. She previously had her right knee replaced before the COVID-19 pandemic and is satisfied with the outcome and range of motion.  Her current knee pain significantly limits mobility. She can bend or bear weight but not both at the same time. She uses a walker. She has stiffness and a limp but can do light to moderate housework and yard work when needed. She usually does water aerobics, which helps, but has stopped for the 10 days before surgery.  She takes Synthroid  in the morning and Crestor  (rosuvastatin ) in the evening, and CoQ10 twice daily for statin-related cramps. She has stopped vitamins, Tylenol , and Advil in preparation for surgery. She has no urinary symptoms, chest pain, recent heart attack, heart failure, valvular disease, diabetes, or stroke.       Pt is a 79 y.o. female who is here for preoperative clearance for L TKA  1) High Risk Cardiac Conditions  1) Recent MI - No.  2) Decompensated Heart Failure - No.  3) Unstable angina - No.  4) Symptomatic arrythmia - No.  5) Sx Valvular Disease - No.  2) Intermediate Risk Factors - DM, CKD, CVA, CHF, CAD - No.  2) Functional Status - > 4 mets (Walk, run, climb stairs) Yes.  SABRA Hails Activity Status Index: 42.7 (7.99 METs)  3) Surgery Specific Risk -   Intermediate (Carotid, Head and Neck, Orthopaedic )  4) Further Noninvasive evaluation  -   1) EKG - No.   1) Hx of CVA, CAD, DM, CKD  2) Echo - No.   1) Worsening dyspnea   3) Stress Testing - Active Cardiac Disease - No.  5) Need for medical therapy - Beta Blocker, Statins indicated ? No.   Medications: Show/hide medication list[1]  Review of Systems     Objective    BP 123/66   Pulse 68   Resp 14   Ht 5' 9 (1.753 m)   Wt 179 lb 11.2 oz (81.5 kg)   SpO2 100%   BMI 26.54 kg/m    Physical Exam Vitals reviewed.  Constitutional:      General: She is not in acute distress.    Appearance: Normal appearance. She is well-developed. She is not diaphoretic.  HENT:     Head: Normocephalic and atraumatic.  Eyes:     General: No scleral icterus.    Conjunctiva/sclera: Conjunctivae normal.  Neck:     Thyroid : No thyromegaly.  Cardiovascular:     Rate and Rhythm: Normal rate and regular rhythm.     Heart sounds: Normal heart sounds. No murmur heard. Pulmonary:     Effort: Pulmonary effort is normal. No respiratory distress.     Breath sounds: Normal breath sounds. No wheezing, rhonchi or rales.  Musculoskeletal:     Cervical back: Neck supple.     Right lower leg: No  edema.     Left lower leg: No edema.  Lymphadenopathy:     Cervical: No cervical adenopathy.  Skin:    General: Skin is warm and dry.  Neurological:     Mental Status: She is alert and oriented to person, place, and time. Mental status is at baseline.  Psychiatric:        Mood and Affect: Mood normal.        Behavior: Behavior normal.      No results found for any visits on 12/14/24.  Assessment & Plan     Problem List Items Addressed This Visit   None Visit Diagnoses       Pre-op exam    -  Primary   Relevant Orders   CBC with Differential/Platelet   Comprehensive metabolic panel with GFR   Hemoglobin A1c     Encounter for screening mammogram for malignant neoplasm of breast              Preoperative evaluation for total knee arthroplasty Scheduled for total knee  arthroplasty on February 10th. Previous cortisone injections were ineffective. Surgical risk is below average with a 1.8% risk of any complication and 0.1% risk of cardiac complications. Cleared for surgery with no need for EKG, urine test, or additional cardiac evaluations. Optimized for surgery with current medications and supplements. - Ordered blood sugar, blood counts, kidney, and liver function tests - Will send lab results to surgical team - Continue Synthroid  on the morning of surgery - Hold supplements before surgery - Continue Crestor  (rosuvastatin ) as usual  Osteoarthritis of left knee Chronic osteoarthritis of the left knee with progressive worsening despite previous cortisone injections. Scheduled for total knee arthroplasty to address symptoms and improve function. Previous right knee replacement was successful, and she is aware of the importance of postoperative rehabilitation. - Proceed with total knee arthroplasty on February 10th - Plan for postoperative rehabilitation to ensure optimal recovery  Hypothyroidism Managed with Synthroid . - Continue Synthroid  on the morning of surgery  Hyperlipidemia Managed with Crestor  (rosuvastatin ). CoQ10 supplementation has been effective in preventing statin-associated muscle cramps. - Continue Crestor  (rosuvastatin ) as usual - Continue CoQ10 supplementation       I have independently evaluated patient.  Kenadi Jodi Day is a 79 y.o. female who is low risk for a moderate risk surgery.  There are not modifiable risk factors (smoking, etc). Jodi Day's RCRI/NSQIP calculation for MACE is: 1.8% of any complication (below average), 0.1% cardiac complication (below average).      No follow-ups on file.       Jon Eva, MD  [facility] [phone] (phone) [phone] (fax)  [facility]     [1]  Outpatient Medications Prior to Visit  Medication Sig   Acetylcysteine (NAC PO)  Take 1.2 g by mouth.   b complex vitamins tablet Take 1 tablet by mouth daily. 20mg    Chromium (CHROMEMATE PO) Take 600 mcg by mouth.   CINNAMON PO Take 1,200 mg by mouth.   co-enzyme Q-10 30 MG capsule Take 100 mg by mouth daily.   metFORMIN  (GLUCOPHAGE -XR) 500 MG 24 hr tablet Take 1 tablet (500 mg total) by mouth 2 (two) times daily.   NON FORMULARY Take 1 capsule by mouth daily at 6 (six) AM. Eye Sight   OVER THE COUNTER MEDICATION Take 12.5 mg by mouth daily. Iodine Complex   OVER THE COUNTER MEDICATION AvinoCort   OVER THE COUNTER MEDICATION GYMNEMA 400MG /300MG    OVER THE  COUNTER MEDICATION KoncentratedK (vit K1 5070mcg/Vit K2 25mg /Vit K2 25mg /Vit K2.5mg /Astaxanthin 2mg )   PRESCRIPTION MEDICATION Place 1 application rectally every 4 (four) months. Hormone replacement therapy pellet inserted rectally every 4 months. Dosing depends on blood work   rosuvastatin  (CRESTOR ) 5 MG tablet TAKE ONE TABLET BY MOUTH ONCE DAILY   SELENIUM PO Take 200 mcg by mouth.   SYNTHROID  125 MCG tablet Take 125 mcg by mouth daily before breakfast.    VITAMIN D , CHOLECALCIFEROL , PO Take 10,000 Units by mouth daily.    [DISCONTINUED] DHEA 10 MG TABS Take 10 mg by mouth daily.   [DISCONTINUED] fluorouracil (EFUDEX) 5 % cream Apply 1 Application topically 2 (two) times daily.   No facility-administered medications prior to visit.   "

## 2024-12-15 LAB — COMPREHENSIVE METABOLIC PANEL WITH GFR
ALT: 16 [IU]/L (ref 0–32)
AST: 20 [IU]/L (ref 0–40)
Albumin: 4.5 g/dL (ref 3.8–4.8)
Alkaline Phosphatase: 62 [IU]/L (ref 49–135)
BUN/Creatinine Ratio: 17 (ref 12–28)
BUN: 11 mg/dL (ref 8–27)
Bilirubin Total: 0.5 mg/dL (ref 0.0–1.2)
CO2: 23 mmol/L (ref 20–29)
Calcium: 9.4 mg/dL (ref 8.7–10.3)
Chloride: 104 mmol/L (ref 96–106)
Creatinine, Ser: 0.63 mg/dL (ref 0.57–1.00)
Globulin, Total: 2.2 g/dL (ref 1.5–4.5)
Glucose: 100 mg/dL — ABNORMAL HIGH (ref 70–99)
Potassium: 4.3 mmol/L (ref 3.5–5.2)
Sodium: 141 mmol/L (ref 134–144)
Total Protein: 6.7 g/dL (ref 6.0–8.5)
eGFR: 92 mL/min/{1.73_m2}

## 2024-12-15 LAB — CBC WITH DIFFERENTIAL/PLATELET
Basophils Absolute: 0.1 10*3/uL (ref 0.0–0.2)
Basos: 1 %
EOS (ABSOLUTE): 0.3 10*3/uL (ref 0.0–0.4)
Eos: 4 %
Hematocrit: 41.4 % (ref 34.0–46.6)
Hemoglobin: 14 g/dL (ref 11.1–15.9)
Immature Grans (Abs): 0 10*3/uL (ref 0.0–0.1)
Immature Granulocytes: 0 %
Lymphocytes Absolute: 1.3 10*3/uL (ref 0.7–3.1)
Lymphs: 15 %
MCH: 30.7 pg (ref 26.6–33.0)
MCHC: 33.8 g/dL (ref 31.5–35.7)
MCV: 91 fL (ref 79–97)
Monocytes Absolute: 0.7 10*3/uL (ref 0.1–0.9)
Monocytes: 8 %
Neutrophils Absolute: 6.3 10*3/uL (ref 1.4–7.0)
Neutrophils: 72 %
Platelets: 318 10*3/uL (ref 150–450)
RBC: 4.56 x10E6/uL (ref 3.77–5.28)
RDW: 12.2 % (ref 11.7–15.4)
WBC: 8.7 10*3/uL (ref 3.4–10.8)

## 2024-12-15 LAB — HEMOGLOBIN A1C
Est. average glucose Bld gHb Est-mCnc: 105 mg/dL
Hgb A1c MFr Bld: 5.3 % (ref 4.8–5.6)

## 2024-12-18 ENCOUNTER — Ambulatory Visit: Payer: Self-pay | Admitting: Family Medicine

## 2025-01-29 ENCOUNTER — Encounter: Admitting: Family Medicine

## 2025-02-05 ENCOUNTER — Encounter: Admitting: Family Medicine

## 2025-02-14 ENCOUNTER — Ambulatory Visit
